# Patient Record
Sex: Female | Born: 1935
Health system: Southern US, Community
[De-identification: ages and names within clinical notes are randomized; demographics above are authoritative.]

## PROBLEM LIST (undated history)

## (undated) DIAGNOSIS — Z8669 Personal history of other diseases of the nervous system and sense organs: Secondary | ICD-10-CM

## (undated) DIAGNOSIS — M810 Age-related osteoporosis without current pathological fracture: Secondary | ICD-10-CM

## (undated) DIAGNOSIS — I10 Essential (primary) hypertension: Secondary | ICD-10-CM

## (undated) DIAGNOSIS — E785 Hyperlipidemia, unspecified: Secondary | ICD-10-CM

## (undated) DIAGNOSIS — S065X9A Traumatic subdural hemorrhage with loss of consciousness of unspecified duration, initial encounter: Secondary | ICD-10-CM

## (undated) DIAGNOSIS — H348192 Central retinal vein occlusion, unspecified eye, stable: Secondary | ICD-10-CM

## (undated) DIAGNOSIS — M1990 Unspecified osteoarthritis, unspecified site: Secondary | ICD-10-CM

## (undated) HISTORY — DX: Age-related osteoporosis without current pathological fracture: M81.0

## (undated) HISTORY — DX: Central retinal vein occlusion, unspecified eye, stable: H34.8192

## (undated) HISTORY — DX: Personal history of other diseases of the nervous system and sense organs: Z86.69

## (undated) HISTORY — PX: CATARACT EXTRACTION W/ INTRAOCULAR LENS  IMPLANT, BILATERAL: SHX1307

## (undated) HISTORY — DX: Essential (primary) hypertension: I10

## (undated) HISTORY — DX: Hyperlipidemia, unspecified: E78.5

---

## 1992-04-14 HISTORY — PX: ABDOMINAL HYSTERECTOMY: SHX81

## 1998-10-24 ENCOUNTER — Other Ambulatory Visit: Admission: RE | Admit: 1998-10-24 | Discharge: 1998-10-24 | Payer: Self-pay | Admitting: Family Medicine

## 2004-05-09 ENCOUNTER — Other Ambulatory Visit: Admission: RE | Admit: 2004-05-09 | Discharge: 2004-05-09 | Payer: Self-pay | Admitting: Family Medicine

## 2004-05-09 ENCOUNTER — Ambulatory Visit: Payer: Self-pay | Admitting: Family Medicine

## 2004-05-15 ENCOUNTER — Ambulatory Visit: Payer: Self-pay | Admitting: Family Medicine

## 2005-01-09 ENCOUNTER — Ambulatory Visit: Payer: Self-pay | Admitting: Family Medicine

## 2005-01-28 ENCOUNTER — Ambulatory Visit: Payer: Self-pay | Admitting: Family Medicine

## 2005-08-28 ENCOUNTER — Ambulatory Visit: Payer: Self-pay | Admitting: Family Medicine

## 2005-09-25 ENCOUNTER — Ambulatory Visit: Payer: Self-pay | Admitting: Internal Medicine

## 2007-01-27 ENCOUNTER — Encounter: Payer: Self-pay | Admitting: Family Medicine

## 2007-01-27 DIAGNOSIS — M25559 Pain in unspecified hip: Secondary | ICD-10-CM

## 2007-01-27 DIAGNOSIS — E7800 Pure hypercholesterolemia, unspecified: Secondary | ICD-10-CM | POA: Insufficient documentation

## 2007-01-27 DIAGNOSIS — H349 Unspecified retinal vascular occlusion: Secondary | ICD-10-CM

## 2007-01-27 DIAGNOSIS — M810 Age-related osteoporosis without current pathological fracture: Secondary | ICD-10-CM

## 2007-02-01 ENCOUNTER — Ambulatory Visit: Payer: Self-pay | Admitting: Family Medicine

## 2007-02-08 LAB — CONVERTED CEMR LAB
Albumin: 3.9 g/dL (ref 3.5–5.2)
CO2: 31 meq/L (ref 19–32)
Creatinine, Ser: 0.9 mg/dL (ref 0.4–1.2)
GFR calc non Af Amer: 64 mL/min
Glucose, Bld: 85 mg/dL (ref 70–99)
HDL: 62.9 mg/dL (ref >39.0)
Phosphorus: 3.8 mg/dL (ref 2.3–4.6)
Sodium: 141 meq/L (ref 135–145)
TSH: 1.27 microintl units/mL (ref 0.35–5.50)
Triglycerides: 71 mg/dL (ref 0–149)
Vit D, 1,25-Dihydroxy: 17 — ABNORMAL LOW (ref 30–89)

## 2007-04-02 ENCOUNTER — Ambulatory Visit: Payer: Self-pay | Admitting: Family Medicine

## 2007-04-07 LAB — CONVERTED CEMR LAB: Vit D, 1,25-Dihydroxy: 38 (ref 30–89)

## 2007-07-07 ENCOUNTER — Ambulatory Visit: Payer: Self-pay | Admitting: Family Medicine

## 2007-07-14 LAB — CONVERTED CEMR LAB: Vit D, 1,25-Dihydroxy: 39 (ref 30–89)

## 2008-01-21 ENCOUNTER — Encounter: Payer: Self-pay | Admitting: Family Medicine

## 2008-02-07 ENCOUNTER — Ambulatory Visit: Payer: Self-pay | Admitting: Family Medicine

## 2008-02-07 DIAGNOSIS — E559 Vitamin D deficiency, unspecified: Secondary | ICD-10-CM

## 2008-02-09 LAB — CONVERTED CEMR LAB
ALT: 16 units/L (ref 0–35)
AST: 23 units/L (ref 0–37)
Alkaline Phosphatase: 53 units/L (ref 39–117)
BUN: 10 mg/dL (ref 6–23)
Bilirubin, Direct: 0.1 mg/dL (ref 0.0–0.3)
Cholesterol: 169 mg/dL (ref 0–200)
Creatinine, Ser: 0.9 mg/dL (ref 0.4–1.2)
GFR calc Af Amer: 78 mL/min
Glucose, Bld: 89 mg/dL (ref 70–99)
Phosphorus: 3.7 mg/dL (ref 2.3–4.6)
Total Protein: 6.7 g/dL (ref 6.0–8.3)

## 2008-02-17 ENCOUNTER — Encounter: Payer: Self-pay | Admitting: Family Medicine

## 2008-04-24 ENCOUNTER — Ambulatory Visit: Payer: Self-pay | Admitting: Family Medicine

## 2008-07-05 ENCOUNTER — Ambulatory Visit: Payer: Self-pay | Admitting: Family Medicine

## 2008-07-06 LAB — CONVERTED CEMR LAB
Basophils Absolute: 0 10*3/uL (ref 0.0–0.1)
Eosinophils Relative: 3.3 % (ref 0.0–5.0)
HCT: 40.3 % (ref 36.0–46.0)
Hemoglobin: 14.2 g/dL (ref 12.0–15.0)
Lymphs Abs: 2.2 10*3/uL (ref 0.7–4.0)
MCV: 95.4 fL (ref 78.0–100.0)
Monocytes Absolute: 0.5 10*3/uL (ref 0.1–1.0)
Neutro Abs: 3.9 10*3/uL (ref 1.4–7.7)
Platelets: 262 10*3/uL (ref 150.0–400.0)
RDW: 12.2 % (ref 11.5–14.6)

## 2008-08-31 ENCOUNTER — Ambulatory Visit: Payer: Self-pay | Admitting: Family Medicine

## 2008-08-31 DIAGNOSIS — M549 Dorsalgia, unspecified: Secondary | ICD-10-CM | POA: Insufficient documentation

## 2009-01-22 ENCOUNTER — Encounter: Payer: Self-pay | Admitting: Family Medicine

## 2009-01-26 LAB — HM MAMMOGRAPHY: HM Mammogram: NORMAL

## 2009-01-29 ENCOUNTER — Encounter (INDEPENDENT_AMBULATORY_CARE_PROVIDER_SITE_OTHER): Payer: Self-pay | Admitting: *Deleted

## 2009-02-07 ENCOUNTER — Telehealth: Payer: Self-pay | Admitting: Family Medicine

## 2009-03-27 ENCOUNTER — Ambulatory Visit: Payer: Self-pay | Admitting: Family Medicine

## 2009-03-27 DIAGNOSIS — I10 Essential (primary) hypertension: Secondary | ICD-10-CM | POA: Insufficient documentation

## 2009-03-30 LAB — CONVERTED CEMR LAB
ALT: 19 units/L (ref 0–35)
AST: 21 units/L (ref 0–37)
Albumin: 3.8 g/dL (ref 3.5–5.2)
Chloride: 105 meq/L (ref 96–112)
Cholesterol: 178 mg/dL (ref 0–200)
Glucose, Bld: 82 mg/dL (ref 70–99)
LDL Cholesterol: 101 mg/dL — ABNORMAL HIGH (ref 0–99)
Phosphorus: 4.3 mg/dL (ref 2.3–4.6)
Potassium: 4.2 meq/L (ref 3.5–5.1)
Sodium: 142 meq/L (ref 135–145)
TSH: 2.34 microintl units/mL (ref 0.35–5.50)
VLDL: 10.4 mg/dL (ref 0.0–40.0)

## 2009-08-10 ENCOUNTER — Encounter (INDEPENDENT_AMBULATORY_CARE_PROVIDER_SITE_OTHER): Payer: Self-pay | Admitting: *Deleted

## 2009-11-14 ENCOUNTER — Telehealth: Payer: Self-pay | Admitting: Family Medicine

## 2009-11-15 ENCOUNTER — Encounter: Payer: Self-pay | Admitting: Family Medicine

## 2010-05-08 ENCOUNTER — Telehealth (INDEPENDENT_AMBULATORY_CARE_PROVIDER_SITE_OTHER): Payer: Self-pay | Admitting: *Deleted

## 2010-05-13 ENCOUNTER — Other Ambulatory Visit: Payer: Self-pay | Admitting: Family Medicine

## 2010-05-13 ENCOUNTER — Encounter: Payer: Self-pay | Admitting: Family Medicine

## 2010-05-13 ENCOUNTER — Ambulatory Visit
Admission: RE | Admit: 2010-05-13 | Discharge: 2010-05-13 | Payer: Self-pay | Source: Home / Self Care | Attending: Family Medicine | Admitting: Family Medicine

## 2010-05-13 LAB — LIPID PANEL
Cholesterol: 170 mg/dL (ref 0–200)
LDL Cholesterol: 88 mg/dL (ref 0–99)
Triglycerides: 89 mg/dL (ref 0.0–149.0)

## 2010-05-13 LAB — CBC WITH DIFFERENTIAL/PLATELET
Basophils Absolute: 0.1 10*3/uL (ref 0.0–0.1)
Eosinophils Absolute: 0.2 10*3/uL (ref 0.0–0.7)
HCT: 43.2 % (ref 36.0–46.0)
Hemoglobin: 15 g/dL (ref 12.0–15.0)
Lymphs Abs: 2.7 10*3/uL (ref 0.7–4.0)
MCHC: 34.8 g/dL (ref 30.0–36.0)
MCV: 95.1 fl (ref 78.0–100.0)
Monocytes Absolute: 0.6 10*3/uL (ref 0.1–1.0)
Neutro Abs: 4.8 10*3/uL (ref 1.4–7.7)
RDW: 13.3 % (ref 11.5–14.6)

## 2010-05-13 LAB — RENAL FUNCTION PANEL
Albumin: 4 g/dL (ref 3.5–5.2)
BUN: 13 mg/dL (ref 6–23)
CO2: 30 mEq/L (ref 19–32)
Chloride: 102 mEq/L (ref 96–112)
Phosphorus: 4.2 mg/dL (ref 2.3–4.6)

## 2010-05-13 LAB — HEPATIC FUNCTION PANEL
Albumin: 4 g/dL (ref 3.5–5.2)
Total Protein: 6.8 g/dL (ref 6.0–8.3)

## 2010-05-14 NOTE — Letter (Signed)
Summary: Colonoscopy Letter  [facility]  [address] [place], [place] [address]   Phone: [phone]  Fax: [phone]      August 10, 2009 MRN: [id number]   [facility] [address] [place], [place]  [address]   Dear Ms. Raburn,   According to your medical record, it is time for you to schedule a Colonoscopy. The American Cancer Society recommends this procedure as a method to detect early colon cancer. Patients with a family history of colon cancer, or a personal history of colon polyps or inflammatory bowel disease are at increased risk.  This letter has beeen generated based on the recommendations made at the time of your procedure. If you feel that in your particular situation this may no longer apply, please contact our office.  Please call our office at [phone] to schedule this appointment or to update your records at your earliest convenience.  Thank you for cooperating with [place] to provide you with the very best care possible.   Sincerely,  Judie Petit T. Russella Dar, M.D.  [facility] [phone]

## 2010-05-14 NOTE — Progress Notes (Signed)
Summary: prior Berkley Harvey is needed for lipitor  Phone Note From Pharmacy   [facility] Summary of Call: Prior auth is needed for lipitor, form is on your shelf. Initial call taken by: Lowella Petties CMA,  November 14, 2009 3:39 PM  Follow-up for Phone Call        please let pt know that ins wants her to change to generic statin - (unless she wants to pay for her lipitor herself)  they ask if she has tried or had side eff to any other chol meds (please ask her )  if not -- may need to change it  I will hold the form Follow-up by: Judith Part MD,  November 15, 2009 8:10 AM  Additional Follow-up for Phone Call Additional follow up Details #1::        Patient notified as instructed by telephone.  Pt said she has not had side effects from other statin  meds. Pt wants to continue taking the Lipitor even if she has to pay for it.Lewanda Rife LPN  November 15, 2009 8:44 AM     Additional Follow-up for Phone Call Additional follow up Details #2::    ok - I will fill out form best I can  I do think lipitor is supposed to go generic soon put in IN box Follow-up by: Judith Part MD,  November 15, 2009 8:45 AM  Additional Follow-up for Phone Call Additional follow up Details #3:: Details for Additional Follow-up Action Taken: comopleted form faxed to [phone]. Form was given to Jacki Cones if needed later.Lewanda Rife LPN  November 15, 2009 11:16 AM    Appended Document: prior Berkley Harvey is needed for lipitor Prior auth received for lipitor, form is on your shelf for signature.

## 2010-05-14 NOTE — Medication Information (Signed)
Summary: Prior Authorization & Approval for Lipitor/[place] [employer]  Prior Authorization & Approval for Lipitor/[place] [employer]   Imported By: Lanelle Bal 11/22/2009 13:54:59  _____________________________________________________________________  External Attachment:    Type:   Image     Comment:   External Document

## 2010-05-16 NOTE — Progress Notes (Signed)
----   Converted from flag ---- ---- 05/05/2010 11:53 AM, Colon Flattery Tower MD wrote: please check lipid and renal and cbc with diff/ tsh/ hepatic / vit D for 272, 733.0 adn 796.2 and vit D def thanks  ---- 05/03/2010 12:56 PM, Liane Comber CMA (AAMA) wrote: Lab orders please! Good Morning! This pt is scheduled for cpx labs Monday, which labs to draw and dx codes to use? Thanks Tasha ------------------------------

## 2010-05-20 ENCOUNTER — Encounter: Payer: Self-pay | Admitting: Family Medicine

## 2010-05-20 ENCOUNTER — Encounter (INDEPENDENT_AMBULATORY_CARE_PROVIDER_SITE_OTHER): Payer: MEDICARE | Admitting: Family Medicine

## 2010-05-20 DIAGNOSIS — E7800 Pure hypercholesterolemia, unspecified: Secondary | ICD-10-CM

## 2010-05-20 DIAGNOSIS — I10 Essential (primary) hypertension: Secondary | ICD-10-CM

## 2010-05-20 DIAGNOSIS — M549 Dorsalgia, unspecified: Secondary | ICD-10-CM

## 2010-06-05 NOTE — Assessment & Plan Note (Signed)
Summary: CPX/ CLE   Vital Signs:  Patient profile:   77 year old female Height:      65 inches Weight:      109.75 pounds BMI:     18.33 Temp:     97.5 degrees F oral Pulse rate:   84 / minute Pulse rhythm:   regular BP sitting:   142 / 84  (left arm) Cuff size:   regular  Vitals Entered By: Lewanda Rife LPN (May 20, 2010 2:24 PM) CC: check up of chronic med problems    History of Present Illness: here for check up of chronic medical problems and to review health mt list has been feeling fine - no prolems   wt is down 5 lb with bmi of 18 just not much of an appetite as she gets older  not a big eater  keeps busy and active   142/84 first bp today  lipids good with lipitor and diet  Last Lipid ProfileCholesterol: 170 (05/13/2010 9:12:38 AM)HDL:  63.80 (05/13/2010 9:12:38 AM)LDL:  88 (05/13/2010 9:12:38 AM)Triglycerides:  Last Liver profileSGOT:  20 (05/13/2010 9:12:38 AM)SPGT:  17 (05/13/2010 9:12:38 AM)T. Bili:  0.5 (05/13/2010 9:12:38 AM)Alk Phos:  64 (05/13/2010 9:12:38 AM)   OP- dexa was 09- due for that  on evista - over 5 years - time to stop it is taking ca and D -- D level in the 40s   hyst in past nl pap n 06 no gyn symptoms or problems    mam 10/10 no lumps on self exam   colonosc nl 04  Td 04 ptx 04 flu shot -- will get one at the pharmacy zoster -- had vaccine       Allergies (verified): No Known Drug Allergies  Past History:  Past Surgical History: Last updated: 02/21/2008 Hysterectomy- fibroid tumor (1994) Abn pap- laser treatment Dexa- osteopenia (10/1998) Dexa- osteoporosis (05/2001) Retinal vein occlusion (01/2002) Carotid US (03/2002) Colonoscopy- normal (08/2002) Dexa- osteoporosis (05/2004) dexa - osteoporosis stable (11/09) Cataract extraction  Family History: Last updated: January 30, 2007 Father: deceased- CAD, ? DM Mother: died age 68 Siblings: sister with MI age 18, 3 brothers with CAD/ bypass  Social History: Last  updated: 02/07/2008 Marital Status: Married Children:  Occupation: retired non smoker no alcohol   Risk Factors: Smoking Status: never (01/30/2007)  Past Medical History: Osteoporosis-- stopped evista after 5 years  hyperlipidemia hx of retinal vein occlusion  Review of Systems General:  Denies fatigue, loss of appetite, and malaise. Eyes:  Denies blurring and eye irritation. CV:  Denies chest pain or discomfort, lightheadness, palpitations, shortness of breath with exertion, and swelling of feet. Resp:  Denies cough, pleuritic, shortness of breath, and wheezing. GI:  Denies abdominal pain, change in bowel habits, and nausea. GU:  Denies dysuria and urinary frequency. MS:  Complains of low back pain; denies joint redness, joint swelling, muscle aches, and cramps. Derm:  Denies itching, lesion(s), poor wound healing, and rash. Neuro:  Denies numbness and tingling. Psych:  Denies anxiety and depression. Endo:  Denies cold intolerance, excessive thirst, excessive urination, and heat intolerance. Heme:  Denies abnormal bruising and bleeding.  Physical Exam  General:  slim well appearing elderly female non smoker- but smells strongly of smoke Head:  normocephalic, atraumatic, and no abnormalities observed.   Eyes:  vision grossly intact, pupils equal, pupils round, and pupils reactive to light.  no conjunctival pallor, injection or icterus  Ears:  R ear normal and L ear normal.   Nose:  no nasal discharge.   Mouth:  pharynx pink and moist.   Neck:  supple with full rom and no masses or thyromegally, no JVD or carotid bruit  Chest Wall:  No deformities, masses, or tenderness noted. Breasts:  No mass, nodules, thickening, tenderness, bulging, retraction, inflamation, nipple discharge or skin changes noted.   Lungs:  Normal respiratory effort, chest expands symmetrically. Lungs are clear to auscultation, no crackles or wheezes. Heart:  Normal rate and regular rhythm. S1 and S2  normal without gallop, murmur, click, rub or other extra sounds. Abdomen:  Bowel sounds positive,abdomen soft and non-tender without masses, organomegaly or hernias noted. no renal bruits  Msk:  No deformity or scoliosis noted of thoracic or lumbar spine.  no acute joint changes  Pulses:  R and L carotid,radial,femoral,dorsalis pedis and posterior tibial pulses are full and equal bilaterally Extremities:  No clubbing, cyanosis, edema, or deformity noted with normal full range of motion of all joints.   Neurologic:  sensation intact to light touch, gait normal, and DTRs symmetrical and normal.   Skin:  Intact without suspicious lesions or rashes Cervical Nodes:  No lymphadenopathy noted Axillary Nodes:  No palpable lymphadenopathy Inguinal Nodes:  No significant adenopathy Psych:  normal affect, talkative and pleasant    Impression & Recommendations:  Problem # 1:  OTHER SCREENING MAMMOGRAM (ICD-V76.12) Assessment Comment Only annual mammogram scheduled adv pt to continue regular self breast exams non remarkable breast exam today  Orders: Radiology Referral (Radiology)  Problem # 2:  HYPERTENSION, BENIGN ESSENTIAL (ICD-401.1) Assessment: New  bp has gradually increased with age and pt has family hx given handout from aafp on HTN to read start norvasc and update if side eff or problems  f/u 6 weeks  Her updated medication list for this problem includes:    Norvasc 5 Mg Tabs (Amlodipine besylate) .Marland Kitchen... 1 by mouth once daily  Orders: Prescription Created Electronically [phone])  Problem # 3:  UNSPECIFIED VITAMIN D DEFICIENCY (ICD-268.9) Assessment: Improved this is much better - with supplementation labs rev  Problem # 4:  HYPERCHOLESTEROLEMIA (ICD-272.0) Assessment: Unchanged  this is in great control  lipitor -no change disc diet - low sat fat (do not want pt to loose wt , however) Her updated medication list for this problem includes:    Lipitor 10 Mg Tabs (Atorvastatin  calcium) ..... One by mouth once daily  Labs Reviewed: SGOT: 20 (05/13/2010)   SGPT: 17 (05/13/2010)   HDL:63.80 (05/13/2010), 66.90 (03/27/2009)  LDL:88 (05/13/2010), 101 (96/29/5284)  Chol:170 (05/13/2010), 178 (03/27/2009)  Trig:89.0 (05/13/2010), 52.0 (03/27/2009)  Orders: Prescription Created Electronically [phone])  Problem # 5:  OSTEOPOROSIS (ICD-733.00) Assessment: Unchanged due for dexa stopping evista after 5 y ca and D and exercise disc  will comment with result The following medications were removed from the medication list:    Evista 60 Mg Tabs (Raloxifene hcl) ..... One by mouth once daily Her updated medication list for this problem includes:    Vitamin D 1000 Unit Tabs (Cholecalciferol) .Marland Kitchen... Take one by mouth daily  Orders: Radiology Referral (Radiology)  Complete Medication List: 1)  Amitriptyline Hcl 25 Mg Tabs (Amitriptyline hcl) .Marland Kitchen.. 1-2 by mouth at bedtime 2)  Lipitor 10 Mg Tabs (Atorvastatin calcium) .... One by mouth once daily 3)  Vitamin D 1000 Unit Tabs (Cholecalciferol) .... Take one by mouth daily 4)  Norvasc 5 Mg Tabs (Amlodipine besylate) .Marland Kitchen.. 1 by mouth once daily  Patient Instructions: 1)  go ahead and stop the evista - you  have been on it long enough  2)  go ahead and get a flu shot at a pharmacy  3)  try to increase meal size/ supplement with healthy snacks to get weight back up  4)  high protien items like nuts/ peanut butter and yogurt are good 5)  also supplements like ensure and boost can be helpful as well  6)  start the norvasc 5 mg one time daily for high blood pressure  7)  avoid excess salt  8)  stay active  9)  follow up with me in about 6 weeks to re check blood pressure  Prescriptions: LIPITOR 10 MG  TABS (ATORVASTATIN CALCIUM) one by mouth once daily  #90 x 3   Entered and Authorized by:   Judith Part MD   Signed by:   Judith Part MD on 05/20/2010   Method used:   [facility]        [facility]  [address] ([facility]ail)       [address]       [place], [place]  [address]       Ph: [phone] or [phone]       Fax: [phone]   RxID:   [id number] AMITRIPTYLINE HCL 25 MG  TABS (AMITRIPTYLINE HCL) 1-2 by mouth at bedtime  #180 x 3   Entered and Authorized by:   Judith Part MD   Signed by:   Judith Part MD on 05/20/2010   Method used:   [facility]        [facility] ([facility]ail)       [address]       [place], [place]  [address]       Ph: [phone] or [phone]       Fax: [phone]   RxID:   [id number] NORVASC 5 MG TABS (AMLODIPINE BESYLATE) 1 by mouth once daily  #30 x 11   Entered and Authorized by:   Judith Part MD   Signed by:   Judith Part MD on 05/20/2010   Method used:   [facility]        [facility] [id number]       [address]       [place], [place]  [address]       Ph: [phone] or [phone]       Fax: [phone]   RxID:   [id number]    Orders Added: 1)  Radiology Referral [Radiology] 2)  Radiology Referral [Radiology] 3)  Prescription Created Electronically [phone]    Current Allergies (reviewed today): No known allergies

## 2010-06-06 ENCOUNTER — Encounter: Payer: Self-pay | Admitting: Family Medicine

## 2010-06-10 ENCOUNTER — Encounter: Payer: Self-pay | Admitting: Family Medicine

## 2010-06-10 ENCOUNTER — Encounter (INDEPENDENT_AMBULATORY_CARE_PROVIDER_SITE_OTHER): Payer: Self-pay | Admitting: *Deleted

## 2010-06-11 ENCOUNTER — Encounter: Payer: Self-pay | Admitting: Family Medicine

## 2010-06-11 DIAGNOSIS — Z8669 Personal history of other diseases of the nervous system and sense organs: Secondary | ICD-10-CM

## 2010-06-11 DIAGNOSIS — E785 Hyperlipidemia, unspecified: Secondary | ICD-10-CM

## 2010-06-11 DIAGNOSIS — M810 Age-related osteoporosis without current pathological fracture: Secondary | ICD-10-CM

## 2010-06-20 NOTE — Letter (Signed)
Summary: Results Follow up Letter  [place] [facility]  [address] [place], [place] [address]   Phone: [phone]  Fax: [phone]    06/10/2010 MRN: [id number]    [facility] [place] [address] [facility] [place], [place]  [address]  [place]    Dear Ms. Shin,  The following are the results of your recent test(s):  Test         Result    Pap Smear:        Normal _____  Not Normal _____ Comments: ______________________________________________________ Cholesterol: LDL(Bad cholesterol):         Your goal is less than:         HDL (Good cholesterol):       Your goal is more than: Comments:  ______________________________________________________ Mammogram:        Normal _X____  Not Normal _____ Comments: Please repeat in one year.  ___________________________________________________________________ Hemoccult:        Normal _____  Not normal _______ Comments:    _____________________________________________________________________ Other Tests:    We routinely do not discuss normal results over the telephone.  If you desire a copy of the results, or you have any questions about this information we can discuss them at your next office visit.   Sincerely,     Roxy Manns, MD

## 2010-06-20 NOTE — Miscellaneous (Signed)
Summary: Mammogram results  Clinical Lists Changes  Observations: Added new observation of MAMMO DUE: 06/2011 (06/10/2010 16:17) Added new observation of MAMMOGRAM: normal (06/06/2010 16:18)      Preventive Care Screening  Mammogram:    Date:  06/06/2010    Next Due:  06/2011    Results:  normal

## 2010-07-02 ENCOUNTER — Encounter: Payer: Self-pay | Admitting: Family Medicine

## 2010-07-02 ENCOUNTER — Ambulatory Visit (INDEPENDENT_AMBULATORY_CARE_PROVIDER_SITE_OTHER): Payer: MEDICARE | Admitting: Family Medicine

## 2010-07-02 VITALS — BP 130/70 | HR 89 | Temp 98.1°F | Wt 110.2 lb

## 2010-07-02 DIAGNOSIS — I10 Essential (primary) hypertension: Secondary | ICD-10-CM

## 2010-07-02 MED ORDER — AMLODIPINE BESYLATE 5 MG PO TABS: 5.0000 mg | ORAL_TABLET | Freq: Every day | ORAL | Status: DC

## 2010-07-02 NOTE — Patient Instructions (Signed)
Blood pressure is better today! Continue the low salt diet / start some walking  Continue the amlodipine- here is the mail order px  If any problems- please let me know  Please follow up in about 6 months

## 2010-07-02 NOTE — Assessment & Plan Note (Signed)
Improved with addn of amlodipine 5 mg daily No side eff or problems Also low salt diet Plans to start walking program today  bp better on 2nd check at 130/70- reassuring  Will plan to f/u in about 6 months

## 2010-07-02 NOTE — Progress Notes (Signed)
  Subjective:    Patient ID: Victoria Ball, female    DOB: 06/03/1925, 77 y.o.   MRN: [id number]  Hypertension Pertinent negatives include no chest pain, headaches, neck pain or shortness of breath.  is feeling well as usual  No changes - about the same  No side eff from the norvasc at all  No home or drugstore checks of bp  No headaches or swelling or vision trouble- overall feels fine  Given handout on HTN -- does not usually eat salt or processed foods  No regular exercise --thinks she will try to get out and walk some now  Weight is stable -which is reassuring   Last chol was good too      Review of Systems  Constitutional: Negative for fatigue and unexpected weight change.  HENT: Negative for nosebleeds and neck pain.   Eyes: Negative for visual disturbance.  Respiratory: Negative for cough and shortness of breath.   Cardiovascular: Negative for chest pain and leg swelling.  Gastrointestinal: Negative for abdominal pain.  Genitourinary: Negative for frequency and flank pain.  Musculoskeletal: Negative for myalgias.  Neurological: Negative for dizziness, syncope, light-headedness, numbness and headaches.  Hematological: Does not bruise/bleed easily.  Psychiatric/Behavioral: The patient is not nervous/anxious.    No Known Allergies Past Medical History  Diagnosis Date  . OP (osteoporosis)     stopped Evista after 5 years  . HLD (hyperlipidemia)   . History of central retinal vein occlusion   . Hypertension    Past Surgical History  Procedure Date  . Abdominal hysterectomy 1994    fibroid tumor   History   Social History  . Marital Status: Married    Spouse Name: N/A    Number of Children: N/A  . Years of Education: N/A   Occupational History  . Retired    Social History Main Topics  . Smoking status: Never Smoker   . Smokeless tobacco: Not on file  . Alcohol Use: No  . Drug Use: Not on file  . Sexually Active: Not on file   Other Topics Concern  .  Not on file   Social History Narrative  . No narrative on file   The patient has a family history of  ASSESSMENT:    Objective:   Physical Exam  Constitutional: She appears well-developed. No distress.  HENT:  Head: Normocephalic.  Eyes: Conjunctivae are normal. Pupils are equal, round, and reactive to light.  Neck: Neck supple. No JVD present. Carotid bruit is not present. No thyromegaly present.  Cardiovascular: Normal rate, regular rhythm, normal heart sounds and normal pulses.  PMI is not displaced.   No murmur heard.      No renal bruits  No carotid bruits   Pulmonary/Chest: Effort normal and breath sounds normal. She has no rales.  Abdominal: She exhibits no abdominal bruit. There is no tenderness.  Musculoskeletal: She exhibits no edema.  Lymphadenopathy:    She has no cervical adenopathy.  Neurological: She displays normal reflexes. No cranial nerve deficit.  Skin: Skin is warm and dry. No rash noted.  Psychiatric: She has a normal mood and affect.          Assessment & Plan:

## 2010-11-15 ENCOUNTER — Other Ambulatory Visit: Payer: Medicare Other | Admitting: Gastroenterology

## 2010-11-21 ENCOUNTER — Other Ambulatory Visit: Payer: Self-pay | Admitting: Family Medicine

## 2010-11-21 NOTE — Telephone Encounter (Signed)
Will refill electronically  

## 2010-11-21 NOTE — Telephone Encounter (Signed)
[facility] request refill for Lipitor 10mg  #90 x 0 pt needs to call for appt.

## 2010-11-21 NOTE — Telephone Encounter (Signed)
[facility] request refill for Amitriptyline 25 mg.Please advise.

## 2011-03-03 ENCOUNTER — Other Ambulatory Visit: Payer: Self-pay | Admitting: Family Medicine

## 2011-05-22 ENCOUNTER — Other Ambulatory Visit: Payer: Self-pay | Admitting: Family Medicine

## 2011-05-22 NOTE — Telephone Encounter (Signed)
[facility] request refill Amlodipine 5 mg #30 x 6.

## 2011-09-01 ENCOUNTER — Other Ambulatory Visit: Payer: Self-pay | Admitting: Family Medicine

## 2011-09-15 ENCOUNTER — Ambulatory Visit (INDEPENDENT_AMBULATORY_CARE_PROVIDER_SITE_OTHER): Payer: Medicare Other | Admitting: Family Medicine

## 2011-09-15 ENCOUNTER — Encounter: Payer: Self-pay | Admitting: Family Medicine

## 2011-09-15 VITALS — BP 98/60 | HR 96 | Temp 98.4°F | Ht 64.75 in | Wt 108.8 lb

## 2011-09-15 DIAGNOSIS — I10 Essential (primary) hypertension: Secondary | ICD-10-CM

## 2011-09-15 DIAGNOSIS — E785 Hyperlipidemia, unspecified: Secondary | ICD-10-CM

## 2011-09-15 DIAGNOSIS — M810 Age-related osteoporosis without current pathological fracture: Secondary | ICD-10-CM

## 2011-09-15 MED ORDER — AMLODIPINE BESYLATE 5 MG PO TABS: 5.0000 mg | ORAL_TABLET | Freq: Every day | ORAL | Status: DC

## 2011-09-15 MED ORDER — AMITRIPTYLINE HCL 25 MG PO TABS: ORAL_TABLET | ORAL | Status: DC

## 2011-09-15 MED ORDER — ATORVASTATIN CALCIUM 10 MG PO TABS: 10.0000 mg | ORAL_TABLET | Freq: Every day | ORAL | Status: DC

## 2011-09-15 NOTE — Assessment & Plan Note (Signed)
Much imp with amlodipine  Is low-normal and pt tolerates fine Will continue this dose Lab today Re schedule annual exam  Refilled med

## 2011-09-15 NOTE — Assessment & Plan Note (Signed)
lipitor and diet Lab today Re scheduled annual exam

## 2011-09-15 NOTE — Assessment & Plan Note (Signed)
Checking D level with labs Will disc at annual exam

## 2011-09-15 NOTE — Progress Notes (Signed)
Subjective:    Patient ID: Victoria Ball, female    DOB: 1925/04/19, 78 y.o.   MRN: [id number]  HPI No problems with bp  Wants to re schedule her PE --is tired today after a long trip  BP Readings from Last 3 Encounters:  09/15/11 98/60  07/02/10 130/70  05/20/10 142/84    bp is on the low side      Today Is feeling good - no dizziness at all  Not a salt eater  Stays quite slim  No cp or palpitations or headaches or edema  No side effects to medicines    Is due for cholesterol check On lipitor and low fat diet Lab Results  Component Value Date   CHOL 168 09/15/2011   HDL 68.70 09/15/2011   LDLCALC 83 09/15/2011   LDLDIRECT 135.8 02/01/2007   TRIG 82.0 09/15/2011   CHOLHDL 2 09/15/2011  has hx of retinal vein occlusion and needs to keep this at goal along with bp  No side effects from lipitor at all Denies muscle or joint pain    Is eating regularly for the most part  Knows she needs to keep her weight up   Patient Active Problem List  Diagnoses  . UNSPECIFIED VITAMIN D DEFICIENCY  . HYPERCHOLESTEROLEMIA  . RETINAL VEIN OCCLUSION  . HIP PAIN, RIGHT, CHRONIC  . BACK PAIN  . OSTEOPOROSIS  . HYPERTENSION, BENIGN ESSENTIAL  . OP (osteoporosis)  . HLD (hyperlipidemia)  . History of central retinal vein occlusion   Past Medical History  Diagnosis Date  . OP (osteoporosis)     stopped Evista after 5 years  . HLD (hyperlipidemia)   . History of central retinal vein occlusion   . Hypertension    Past Surgical History  Procedure Date  . Abdominal hysterectomy 1994    fibroid tumor   History  Substance Use Topics  . Smoking status: Passive Smoker  . Smokeless tobacco: Never Used  . Alcohol Use: No   Family History  Problem Relation Age of Onset  . Coronary artery disease Father   . Diabetes Father     ?   Marland Kitchen Heart attack Sister 71  . Coronary artery disease Brother     CABG  . Coronary artery disease Brother     CABG  . Coronary artery disease Brother    CABG  . Angelman syndrome Paternal Grandfather    No Known Allergies Current Outpatient Prescriptions on File Prior to Visit  Medication Sig Dispense Refill  . amitriptyline (ELAVIL) 25 MG tablet Take 1-2 tablets by mouth at bedtime  180 tablet  1  . amLODipine (NORVASC) 5 MG tablet Take 1 tablet (5 mg total) by mouth daily.  90 tablet  0  . atorvastatin (LIPITOR) 10 MG tablet Take 1 tablet (10 mg total) by mouth daily.  90 tablet  0  . cholecalciferol (VITAMIN D) 1000 UNITS tablet Take 1,000 Units by mouth daily.             Review of Systems Review of Systems  Constitutional: Negative for fever, appetite change,  and unexpected weight change. (is fatigued today after a long trip) Eyes: Negative for pain and visual disturbance.  Respiratory: Negative for cough and shortness of breath.   Cardiovascular: Negative for cp or palpitations    Gastrointestinal: Negative for nausea, diarrhea and constipation.  Genitourinary: Negative for urgency and frequency.  Skin: Negative for pallor or rash   Neurological: Negative for weakness, light-headedness, numbness and headaches.  Hematological: Negative for adenopathy. Does not bruise/bleed easily.  Psychiatric/Behavioral: Negative for dysphoric mood. The patient is not nervous/anxious.         Objective:   Physical Exam  Constitutional: She appears well-developed and well-nourished. No distress.  HENT:  Head: Normocephalic and atraumatic.  Mouth/Throat: Oropharynx is clear and moist.  Eyes: Conjunctivae and EOM are normal. Pupils are equal, round, and reactive to light. No scleral icterus.  Neck: Normal range of motion. Neck supple. No JVD present. Carotid bruit is not present. No thyromegaly present.  Cardiovascular: Normal rate, regular rhythm, normal heart sounds and intact distal pulses.  Exam reveals no gallop.   Pulmonary/Chest: Effort normal and breath sounds normal. No respiratory distress. She has no wheezes.  Abdominal: Soft.  Bowel sounds are normal. She exhibits no distension, no abdominal bruit and no mass. There is no tenderness.  Musculoskeletal: She exhibits no edema.  Lymphadenopathy:    She has no cervical adenopathy.  Neurological: She is alert. She has normal reflexes. No cranial nerve deficit. She exhibits normal muscle tone. Coordination normal.  Skin: Skin is warm and dry. No rash noted. No erythema. No pallor.  Psychiatric: She has a normal mood and affect.          Assessment & Plan:

## 2011-09-15 NOTE — Patient Instructions (Signed)
Labs today bp is ok - if you feel light headed in the future let me know  Do not skip meals  Schedule annual exam in 3 months (any 30 min slot)  I sent px to pharmacy

## 2011-09-16 LAB — CBC WITH DIFFERENTIAL/PLATELET
Basophils Relative: 0.3 % (ref 0.0–3.0)
Eosinophils Relative: 1.2 % (ref 0.0–5.0)
HCT: 40.4 % (ref 36.0–46.0)
Lymphs Abs: 2.6 10*3/uL (ref 0.7–4.0)
Monocytes Relative: 5 % (ref 3.0–12.0)
Platelets: 267 10*3/uL (ref 150.0–400.0)
RBC: 4.27 Mil/uL (ref 3.87–5.11)
WBC: 9.2 10*3/uL (ref 4.5–10.5)

## 2011-09-16 LAB — COMPREHENSIVE METABOLIC PANEL
Albumin: 3.6 g/dL (ref 3.5–5.2)
CO2: 28 mEq/L (ref 19–32)
GFR: 45.54 mL/min — ABNORMAL LOW (ref >60.00)
Glucose, Bld: 82 mg/dL (ref 70–99)
Potassium: 4.5 mEq/L (ref 3.5–5.1)
Sodium: 144 mEq/L (ref 135–145)
Total Bilirubin: 0.6 mg/dL (ref 0.3–1.2)
Total Protein: 6.6 g/dL (ref 6.0–8.3)

## 2011-09-16 LAB — TSH: TSH: 0.79 u[IU]/mL (ref 0.35–5.50)

## 2011-10-20 ENCOUNTER — Ambulatory Visit: Payer: Medicare Other | Admitting: Family Medicine

## 2011-11-04 ENCOUNTER — Other Ambulatory Visit: Payer: Self-pay | Admitting: Family Medicine

## 2011-11-19 ENCOUNTER — Encounter: Payer: Self-pay | Admitting: Family Medicine

## 2011-12-17 ENCOUNTER — Encounter: Payer: Self-pay | Admitting: Family Medicine

## 2011-12-17 ENCOUNTER — Ambulatory Visit (INDEPENDENT_AMBULATORY_CARE_PROVIDER_SITE_OTHER): Payer: Medicare Other | Admitting: Family Medicine

## 2011-12-17 VITALS — BP 128/64 | HR 78 | Temp 98.2°F | Ht 65.0 in | Wt 108.2 lb

## 2011-12-17 DIAGNOSIS — E7800 Pure hypercholesterolemia, unspecified: Secondary | ICD-10-CM

## 2011-12-17 DIAGNOSIS — M810 Age-related osteoporosis without current pathological fracture: Secondary | ICD-10-CM

## 2011-12-17 DIAGNOSIS — I10 Essential (primary) hypertension: Secondary | ICD-10-CM

## 2011-12-17 DIAGNOSIS — E785 Hyperlipidemia, unspecified: Secondary | ICD-10-CM

## 2011-12-17 DIAGNOSIS — E559 Vitamin D deficiency, unspecified: Secondary | ICD-10-CM

## 2011-12-17 MED ORDER — ATORVASTATIN CALCIUM 10 MG PO TABS: 10.0000 mg | ORAL_TABLET | Freq: Every day | ORAL | Status: DC

## 2011-12-17 MED ORDER — AMLODIPINE BESYLATE 5 MG PO TABS: 5.0000 mg | ORAL_TABLET | Freq: Every day | ORAL | Status: DC

## 2011-12-17 MED ORDER — AMITRIPTYLINE HCL 25 MG PO TABS: ORAL_TABLET | ORAL | Status: DC

## 2011-12-17 NOTE — Patient Instructions (Signed)
Keep taking good care of yourself! No change in medicines

## 2011-12-17 NOTE — Progress Notes (Signed)
Subjective:    Patient ID: Victoria Ball, female    DOB: 1926/03/14, 78 y.o.   MRN: [id number]  HPI Here for check up of chronic medical conditions and to review health mt list   Has been feeling great - no complaints at all  Has been working and traveling   Wt is stable with bmi of 18  bp is stable today  No cp or palpitations or headaches or edema  No side effects to medicines  BP Readings from Last 3 Encounters:  12/17/11 128/64  09/15/11 98/60  07/02/10 130/70      Lipid Lab Results  Component Value Date   CHOL 168 09/15/2011   CHOL 170 05/13/2010   CHOL 178 03/27/2009   Lab Results  Component Value Date   HDL 68.70 09/15/2011   HDL 63.80 05/13/2010   HDL 11.91 03/27/2009   Lab Results  Component Value Date   LDLCALC 83 09/15/2011   LDLCALC 88 05/13/2010   LDLCALC 101* 03/27/2009   Lab Results  Component Value Date   TRIG 82.0 09/15/2011   TRIG 89.0 05/13/2010   TRIG 52.0 03/27/2009   Lab Results  Component Value Date   CHOLHDL 2 09/15/2011   CHOLHDL 3 05/13/2010   CHOLHDL 3 03/27/2009   Lab Results  Component Value Date   LDLDIRECT 135.8 02/01/2007   on statin and diet-eats a fairly healthy diet  Eats fruits and veg from the farmer's market  Hx of retinal vein occl  OP dexa 2/12 Vit D level 46- that is good , does not always take calcium (constipates her a bit)  Passive smoke exp fx- no fractures at all  Stays very active  Had 5 years of evista in the past   mammo 8/13 Self exam- no lumps or changes   [facility] --[facility]   hyst in past for fibroids No problems or symptoms   colonosc 5/04= that was normal No cancer in family 10 year follow up     Review of Systems    Review of Systems  Constitutional: Negative for fever, appetite change, fatigue and unexpected weight change.  Eyes: Negative for pain and visual disturbance.  Respiratory: Negative for cough and shortness of breath.   Cardiovascular: Negative for cp or  palpitations    Gastrointestinal: Negative for nausea, diarrhea and constipation.  Genitourinary: Negative for urgency and frequency.  Skin: Negative for pallor or rash   Neurological: Negative for weakness, light-headedness, numbness and headaches.  Hematological: Negative for adenopathy. Does not bruise/bleed easily.  Psychiatric/Behavioral: Negative for dysphoric mood. The patient is not nervous/anxious.      Objective:   Physical Exam  Constitutional: She appears well-developed and well-nourished. No distress.       Slim, robust appearing elderly female  HENT:  Head: Normocephalic and atraumatic.  Right Ear: External ear normal.  Left Ear: External ear normal.  Nose: Nose normal.  Mouth/Throat: Oropharynx is clear and moist.  Eyes: Conjunctivae and EOM are normal. Pupils are equal, round, and reactive to light. No scleral icterus.  Neck: Normal range of motion. Neck supple. No JVD present. Carotid bruit is not present. No thyromegaly present.  Cardiovascular: Normal rate, regular rhythm, normal heart sounds and intact distal pulses.  Exam reveals no gallop.   Pulmonary/Chest: Breath sounds normal. No respiratory distress. She has no wheezes.  Abdominal: Soft. Bowel sounds are normal. She exhibits no distension, no abdominal bruit and no mass. There is no tenderness.  Genitourinary: No breast swelling, tenderness, discharge or bleeding.       Breast exam: No mass, nodules, thickening, tenderness, bulging, retraction, inflamation, nipple discharge or skin changes noted.  No axillary or clavicular LA.  Chaperoned exam.    Musculoskeletal: She exhibits no edema and no tenderness.  Lymphadenopathy:    She has no cervical adenopathy.  Neurological: She is alert. She has normal reflexes. No cranial nerve deficit. She exhibits normal muscle tone. Coordination normal.  Skin: Skin is warm and dry. No rash noted. No erythema. No pallor.       Solar lentigos diffusely   Psychiatric: She has a  normal mood and affect.          Assessment & Plan:

## 2012-01-20 ENCOUNTER — Other Ambulatory Visit: Payer: Self-pay | Admitting: Family Medicine

## 2012-03-01 ENCOUNTER — Other Ambulatory Visit: Payer: Self-pay | Admitting: Family Medicine

## 2012-06-04 ENCOUNTER — Telehealth: Payer: Self-pay | Admitting: *Deleted

## 2012-06-04 NOTE — Telephone Encounter (Signed)
Received prior auth form for pt's amitriptyline hcl 25 mg, form placed in your inbox

## 2012-06-04 NOTE — Telephone Encounter (Signed)
Prior auth faxed

## 2012-06-04 NOTE — Telephone Encounter (Signed)
[employer] left v/m approving amitriptyline 06/04/12 thru 06/04/13. Any questions call [phone]. Approval letter will follow. [facility] notified.

## 2012-06-04 NOTE — Telephone Encounter (Signed)
Done and in IN box 

## 2012-06-04 NOTE — Telephone Encounter (Signed)
Good news, thanks.

## 2012-12-12 ENCOUNTER — Telehealth: Payer: Self-pay | Admitting: Family Medicine

## 2012-12-12 DIAGNOSIS — I10 Essential (primary) hypertension: Secondary | ICD-10-CM

## 2012-12-12 DIAGNOSIS — E559 Vitamin D deficiency, unspecified: Secondary | ICD-10-CM

## 2012-12-12 DIAGNOSIS — E7800 Pure hypercholesterolemia, unspecified: Secondary | ICD-10-CM

## 2012-12-12 DIAGNOSIS — M810 Age-related osteoporosis without current pathological fracture: Secondary | ICD-10-CM

## 2012-12-12 DIAGNOSIS — E785 Hyperlipidemia, unspecified: Secondary | ICD-10-CM

## 2012-12-12 NOTE — Telephone Encounter (Signed)
Message copied by Judy Pimple on Sun Dec 12, 2012  2:58 PM ------      Message from: Alvina Chou      Created: Mon Dec 06, 2012  4:44 PM      Regarding: Lab orders for Tuesday, 9.2.14       Patient is scheduled for CPX labs, please order future labs, Thanks , Terri       ------

## 2012-12-14 ENCOUNTER — Other Ambulatory Visit (INDEPENDENT_AMBULATORY_CARE_PROVIDER_SITE_OTHER): Payer: Medicare Other

## 2012-12-14 DIAGNOSIS — M810 Age-related osteoporosis without current pathological fracture: Secondary | ICD-10-CM

## 2012-12-14 DIAGNOSIS — E785 Hyperlipidemia, unspecified: Secondary | ICD-10-CM

## 2012-12-14 DIAGNOSIS — I10 Essential (primary) hypertension: Secondary | ICD-10-CM

## 2012-12-14 DIAGNOSIS — E559 Vitamin D deficiency, unspecified: Secondary | ICD-10-CM

## 2012-12-14 LAB — TSH: TSH: 1.09 u[IU]/mL (ref 0.35–5.50)

## 2012-12-14 LAB — CBC WITH DIFFERENTIAL/PLATELET
Eosinophils Absolute: 0.1 10*3/uL (ref 0.0–0.7)
Eosinophils Relative: 1.2 % (ref 0.0–5.0)
HCT: 41.3 % (ref 36.0–46.0)
Lymphs Abs: 2.6 10*3/uL (ref 0.7–4.0)
MCHC: 34 g/dL (ref 30.0–36.0)
MCV: 93.5 fl (ref 78.0–100.0)
Monocytes Absolute: 0.4 10*3/uL (ref 0.1–1.0)
Neutrophils Relative %: 61.3 % (ref 43.0–77.0)
Platelets: 283 10*3/uL (ref 150.0–400.0)
RDW: 13.5 % (ref 11.5–14.6)

## 2012-12-14 LAB — COMPREHENSIVE METABOLIC PANEL
AST: 15 U/L (ref 0–37)
Alkaline Phosphatase: 61 U/L (ref 39–117)
Glucose, Bld: 91 mg/dL (ref 70–99)
Potassium: 4.3 mEq/L (ref 3.5–5.1)
Sodium: 137 mEq/L (ref 135–145)
Total Bilirubin: 0.7 mg/dL (ref 0.3–1.2)
Total Protein: 6.7 g/dL (ref 6.0–8.3)

## 2012-12-14 LAB — LIPID PANEL
Cholesterol: 205 mg/dL — ABNORMAL HIGH (ref 0–200)
Total CHOL/HDL Ratio: 3
Triglycerides: 124 mg/dL (ref 0.0–149.0)
VLDL: 24.8 mg/dL (ref 0.0–40.0)

## 2012-12-14 LAB — LDL CHOLESTEROL, DIRECT: Direct LDL: 118.8 mg/dL

## 2012-12-18 ENCOUNTER — Other Ambulatory Visit: Payer: Self-pay | Admitting: Family Medicine

## 2012-12-20 ENCOUNTER — Encounter: Payer: Self-pay | Admitting: Family Medicine

## 2012-12-20 ENCOUNTER — Ambulatory Visit (INDEPENDENT_AMBULATORY_CARE_PROVIDER_SITE_OTHER): Payer: Medicare Other | Admitting: Family Medicine

## 2012-12-20 VITALS — BP 124/66 | HR 106 | Temp 98.4°F | Ht 65.0 in | Wt 104.8 lb

## 2012-12-20 DIAGNOSIS — M810 Age-related osteoporosis without current pathological fracture: Secondary | ICD-10-CM

## 2012-12-20 DIAGNOSIS — I10 Essential (primary) hypertension: Secondary | ICD-10-CM

## 2012-12-20 DIAGNOSIS — Z23 Encounter for immunization: Secondary | ICD-10-CM

## 2012-12-20 DIAGNOSIS — E7800 Pure hypercholesterolemia, unspecified: Secondary | ICD-10-CM

## 2012-12-20 DIAGNOSIS — E785 Hyperlipidemia, unspecified: Secondary | ICD-10-CM

## 2012-12-20 DIAGNOSIS — E559 Vitamin D deficiency, unspecified: Secondary | ICD-10-CM

## 2012-12-20 DIAGNOSIS — Z0000 Encounter for general adult medical examination without abnormal findings: Secondary | ICD-10-CM | POA: Insufficient documentation

## 2012-12-20 MED ORDER — AMITRIPTYLINE HCL 25 MG PO TABS: ORAL_TABLET | ORAL | Status: DC

## 2012-12-20 MED ORDER — ATORVASTATIN CALCIUM 10 MG PO TABS: 10.0000 mg | ORAL_TABLET | Freq: Every day | ORAL | Status: DC

## 2012-12-20 MED ORDER — AMLODIPINE BESYLATE 5 MG PO TABS: 5.0000 mg | ORAL_TABLET | Freq: Every day | ORAL | Status: DC

## 2012-12-20 NOTE — Assessment & Plan Note (Signed)
S/p 5 y course of evista  No fx Pt declines further dexa  Disc safety Compliant with ca and D

## 2012-12-20 NOTE — Assessment & Plan Note (Signed)
Disc goals for lipids and reasons to control them Rev labs with pt Needs to inc lipitor to every day/ imp compliance Disc retinal vein occlusion and role this plays Rev low sat fat diet in detail

## 2012-12-20 NOTE — Telephone Encounter (Signed)
Electronic refill request, please advise  

## 2012-12-20 NOTE — Telephone Encounter (Signed)
Please refill for a year  

## 2012-12-20 NOTE — Progress Notes (Signed)
Subjective:    Patient ID: Victoria Ball, female    DOB: 1925-07-09, 79 y.o.   MRN: [id number]  HPI I have personally reviewed the Medicare Annual Wellness questionnaire and have noted 1. The patient's medical and social history 2. Their use of alcohol, tobacco or illicit drugs 3. Their current medications and supplements 4. The patient's functional ability including ADL's, fall risks, home safety risks and hearing or visual             impairment. 5. Diet and physical activities 6. Evidence for depression or mood disorders  The patients weight, height, BMI have been recorded in the chart and visual acuity is per eye clinic.  I have made referrals, counseling and provided education to the patient based review of the above and I have provided the pt with a written personalized care plan for preventive services.  Wt is down 4 lb with bmi of 17 She is not a big eater and she does not cook -- occ sandwich or potato  Knows she needs to get in more calories    See scanned forms.  Routine anticipatory guidance given to patient.  See health maintenance. Flu vaccine today Shingles vaccine 2010 PNA 4/04 vaccine  Tetanus 4/04 - she is due for that  Colon nl colonosc 2004 - not further needed at her age  Breast cancer screening- mammogram was 8/13 - she will schedule her own No lumps on self exam Advance directive  - has a living will  Cognitive function addressed- see scanned forms- and if abnormal then additional documentation follows. - no problems at all  Falls- none at all  She gets out and stays very very active   Mood- is very good/ no depression/ stays busy  dexa 2/12- does not want further bone density tests  No hx of fx  OP Took 5 y of evista D level is good at 54  bp is stable today  No cp or palpitations or headaches or edema  No side effects to medicines  BP Readings from Last 3 Encounters:  12/20/12 124/66  12/17/11 128/64  09/15/11 98/60       Hyperlipidemia On lipitor and diet Lab Results  Component Value Date   CHOL 205* 12/14/2012   CHOL 168 09/15/2011   CHOL 170 05/13/2010   Lab Results  Component Value Date   HDL 59.70 12/14/2012   HDL 68.70 09/15/2011   HDL 63.80 05/13/2010   Lab Results  Component Value Date   LDLCALC 83 09/15/2011   LDLCALC 88 05/13/2010   LDLCALC 101* 03/27/2009   Lab Results  Component Value Date   TRIG 124.0 12/14/2012   TRIG 82.0 09/15/2011   TRIG 89.0 05/13/2010   Lab Results  Component Value Date   CHOLHDL 3 12/14/2012   CHOLHDL 2 09/15/2011   CHOLHDL 3 05/13/2010   Lab Results  Component Value Date   LDLDIRECT 118.8 12/14/2012   LDLDIRECT 135.8 02/01/2007   up a bit from last time - has missed doses - she takes her lipitor 2 times per week     PMH and SH reviewed  Meds, vitals, and allergies reviewed.   ROS: See HPI.  Otherwise negative.       Patient Active Problem List   Diagnosis Date Noted  . HLD (hyperlipidemia)   . History of central retinal vein occlusion   . HYPERTENSION, BENIGN ESSENTIAL 03/27/2009  . BACK PAIN 08/31/2008  . UNSPECIFIED VITAMIN D DEFICIENCY 02/07/2008  . HYPERCHOLESTEROLEMIA 01/27/2007  .  RETINAL VEIN OCCLUSION 01/27/2007  . HIP PAIN, RIGHT, CHRONIC 01/27/2007  . OSTEOPOROSIS 01/27/2007   Past Medical History  Diagnosis Date  . OP (osteoporosis)     stopped Evista after 5 years  . HLD (hyperlipidemia)   . History of central retinal vein occlusion   . Hypertension    Past Surgical History  Procedure Laterality Date  . Abdominal hysterectomy  1994    fibroid tumor   History  Substance Use Topics  . Smoking status: Passive Smoke Exposure - Never Smoker  . Smokeless tobacco: Never Used  . Alcohol Use: No   Family History  Problem Relation Age of Onset  . Coronary artery disease Father   . Diabetes Father     ?   Marland Kitchen Heart attack Sister 56  . Coronary artery disease Brother     CABG  . Coronary artery disease Brother     CABG  . Coronary  artery disease Brother     CABG  . Angelman syndrome Paternal Grandfather    No Known Allergies Current Outpatient Prescriptions on File Prior to Visit  Medication Sig Dispense Refill  . amitriptyline (ELAVIL) 25 MG tablet Take 1-2 tablets by mouth at bedtime  180 tablet  3  . amLODipine (NORVASC) 5 MG tablet Take 1 tablet (5 mg total) by mouth daily.  90 tablet  3  . atorvastatin (LIPITOR) 10 MG tablet Take 1 tablet (10 mg total) by mouth daily.  90 tablet  3  . cholecalciferol (VITAMIN D) 1000 UNITS tablet Take 1,000 Units by mouth daily.         No current facility-administered medications on file prior to visit.     Review of Systems Review of Systems  Constitutional: Negative for fever, appetite change, fatigue and unexpected weight change.  Eyes: Negative for pain and visual disturbance.  Respiratory: Negative for cough and shortness of breath.   Cardiovascular: Negative for cp or palpitations    Gastrointestinal: Negative for nausea, diarrhea and constipation.  Genitourinary: Negative for urgency and frequency.  Skin: Negative for pallor or rash   Neurological: Negative for weakness, light-headedness, numbness and headaches.  Hematological: Negative for adenopathy. Does not bruise/bleed easily.  Psychiatric/Behavioral: Negative for dysphoric mood. The patient is not nervous/anxious.         Objective:   Physical Exam  Constitutional: She appears well-developed and well-nourished. No distress.  Very slim and well appearing   HENT:  Head: Normocephalic and atraumatic.  Right Ear: External ear normal.  Left Ear: External ear normal.  Nose: Nose normal.  Mouth/Throat: Oropharynx is clear and moist. No oropharyngeal exudate.  Eyes: Conjunctivae and EOM are normal. Pupils are equal, round, and reactive to light. Right eye exhibits no discharge. Left eye exhibits no discharge. No scleral icterus.  Neck: Normal range of motion. Neck supple. No JVD present. Carotid bruit is not  present. No thyromegaly present.  Cardiovascular: Normal rate, regular rhythm, normal heart sounds and intact distal pulses.  Exam reveals no gallop.   Pulmonary/Chest: Effort normal and breath sounds normal. No respiratory distress. She has no wheezes. She has no rales.  Abdominal: Soft. Bowel sounds are normal. She exhibits no distension, no abdominal bruit and no mass. There is no tenderness.  Genitourinary: No breast swelling, tenderness, discharge or bleeding.  Breast exam: No mass, nodules, thickening, tenderness, bulging, retraction, inflamation, nipple discharge or skin changes noted.  No axillary or clavicular LA.  Chaperoned exam.    Musculoskeletal: She exhibits no edema and  no tenderness.  Lymphadenopathy:    She has no cervical adenopathy.  Neurological: She is alert. She has normal reflexes. No cranial nerve deficit. She exhibits normal muscle tone. Coordination normal.  Skin: Skin is warm and dry. No rash noted. No erythema. No pallor.  Psychiatric: She has a normal mood and affect.          Assessment & Plan:

## 2012-12-20 NOTE — Assessment & Plan Note (Signed)
D level is tx on current dose Disc imp to bone and overall health 

## 2012-12-20 NOTE — Assessment & Plan Note (Signed)
bp in fair control at this time  No changes needed  Disc lifstyle change with low sodium diet and exercise  Lab reviewed  

## 2012-12-20 NOTE — Patient Instructions (Addendum)
Please increase calories  Aim for 3 meals a day and 2-3 supplements in between (snacks or boost or carnation instant breakfast) You are due for a tetanus shot (Tdap)- get that at the health dept. Since medicare does not pay for it  Get back on Lipitor (generic) every day to help cholesterol  Take good care of yourself

## 2012-12-20 NOTE — Telephone Encounter (Signed)
Dr. Milinda Antis refilled at her appt today

## 2012-12-20 NOTE — Assessment & Plan Note (Signed)
Reviewed health habits including diet and exercise and skin cancer prevention Also reviewed health mt list, fam hx and immunizations  See HPI Disc imp of weight gain - by inc caloric intake- plan discussed  Flu vaccine today

## 2012-12-21 ENCOUNTER — Other Ambulatory Visit: Payer: Self-pay | Admitting: Family Medicine

## 2013-01-07 ENCOUNTER — Other Ambulatory Visit: Payer: Self-pay | Admitting: Family Medicine

## 2013-09-02 ENCOUNTER — Ambulatory Visit (INDEPENDENT_AMBULATORY_CARE_PROVIDER_SITE_OTHER)
Admission: RE | Admit: 2013-09-02 | Discharge: 2013-09-02 | Disposition: A | Discharge status: Home / Self Care | Payer: Medicare HMO | Source: Ambulatory Visit | Attending: Family Medicine | Admitting: Family Medicine

## 2013-09-02 ENCOUNTER — Encounter: Payer: Self-pay | Admitting: Family Medicine

## 2013-09-02 ENCOUNTER — Ambulatory Visit (INDEPENDENT_AMBULATORY_CARE_PROVIDER_SITE_OTHER): Payer: Medicare HMO | Admitting: Family Medicine

## 2013-09-02 VITALS — BP 132/64 | HR 96 | Temp 98.3°F | Ht 65.0 in | Wt 108.8 lb

## 2013-09-02 DIAGNOSIS — M25559 Pain in unspecified hip: Secondary | ICD-10-CM

## 2013-09-02 DIAGNOSIS — M25551 Pain in right hip: Secondary | ICD-10-CM | POA: Insufficient documentation

## 2013-09-02 NOTE — Progress Notes (Signed)
Subjective:    Patient ID: Victoria Ball, female    DOB: 1926/01/11, 80 y.o.   MRN: [id number]  HPI Here for R hip pain  It hurts all the time -for about a month ago - for no reason  No new exercise or walking   Pain is in upper leg/ groin area and also in outer hip  Does not shoot down her leg  Walking hurts it more  ? Last xray -long time ago   advil seems to help it - 2 pills   Patient Active Problem List   Diagnosis Date Noted  . Encounter for Medicare annual wellness exam 12/20/2012  . History of central retinal vein occlusion   . HYPERTENSION, BENIGN ESSENTIAL 03/27/2009  . BACK PAIN 08/31/2008  . UNSPECIFIED VITAMIN D DEFICIENCY 02/07/2008  . HYPERCHOLESTEROLEMIA 01/27/2007  . RETINAL VEIN OCCLUSION 01/27/2007  . HIP PAIN, RIGHT, CHRONIC 01/27/2007  . OSTEOPOROSIS 01/27/2007   Past Medical History  Diagnosis Date  . OP (osteoporosis)     stopped Evista after 5 years  . HLD (hyperlipidemia)   . History of central retinal vein occlusion   . Hypertension    Past Surgical History  Procedure Laterality Date  . Abdominal hysterectomy  1994    fibroid tumor   History  Substance Use Topics  . Smoking status: Passive Smoke Exposure - Never Smoker  . Smokeless tobacco: Never Used  . Alcohol Use: No   Family History  Problem Relation Age of Onset  . Coronary artery disease Father   . Diabetes Father     ?   Marland Kitchen Heart attack Sister 55  . Coronary artery disease Brother     CABG  . Coronary artery disease Brother     CABG  . Coronary artery disease Brother     CABG  . Angelman syndrome Paternal Grandfather    No Known Allergies Current Outpatient Prescriptions on File Prior to Visit  Medication Sig Dispense Refill  . amitriptyline (ELAVIL) 25 MG tablet Take 1-2 tablets by mouth at bedtime  180 tablet  3  . amLODipine (NORVASC) 5 MG tablet Take 1 tablet (5 mg total) by mouth daily.  90 tablet  3  . atorvastatin (LIPITOR) 10 MG tablet Take 1 tablet (10 mg  total) by mouth daily.  90 tablet  3  . cholecalciferol (VITAMIN D) 1000 UNITS tablet Take 1,000 Units by mouth daily.         No current facility-administered medications on file prior to visit.   Review of Systems Review of Systems  Constitutional: Negative for fever, appetite change, fatigue and unexpected weight change.  Eyes: Negative for pain and visual disturbance.  Respiratory: Negative for cough and shortness of breath.   Cardiovascular: Negative for cp or palpitations    Gastrointestinal: Negative for nausea, diarrhea and constipation.  Genitourinary: Negative for urgency and frequency.  Skin: Negative for pallor or rash   MSK pos for hip pain  Neurological: Negative for weakness, light-headedness, numbness and headaches.  Hematological: Negative for adenopathy. Does not bruise/bleed easily.  Psychiatric/Behavioral: Negative for dysphoric mood. The patient is not nervous/anxious.         Objective:   Physical Exam  Constitutional: She appears well-developed and well-nourished. No distress.  HENT:  Head: Normocephalic and atraumatic.  Eyes: Conjunctivae and EOM are normal. Pupils are equal, round, and reactive to light.  Neck: Normal range of motion. Neck supple.  Cardiovascular: Regular rhythm and normal heart sounds.   Pulmonary/Chest:  Effort normal and breath sounds normal. No respiratory distress. She has no wheezes.  Musculoskeletal: She exhibits tenderness. She exhibits no edema.       Right hip: She exhibits decreased range of motion. She exhibits normal strength, no swelling, no crepitus and no deformity.  Pain to fully flex hip and also int and ext rotate  Gait favors other leg  Very slit trochanteric tenderness  Nl rom knee/ ankle     Neurological: She is alert.  Skin: Skin is warm and dry. No rash noted. No erythema.  Psychiatric: She has a normal mood and affect.          Assessment & Plan:

## 2013-09-02 NOTE — Patient Instructions (Signed)
Baby the hip - take it a little easy and use a cane if needed  Heat if it helps  Xray now  Try tylenol 2 pills up to every 4-6 hours as needed Use ibuprofen only if needed   We will update you with a result and plan

## 2013-09-02 NOTE — Progress Notes (Signed)
Pre visit review using our clinic review tool, if applicable. No additional management support is needed unless otherwise documented below in the visit note. 

## 2013-09-04 NOTE — Assessment & Plan Note (Signed)
Suspect OA Xray today  Ibuprofen or tylenol prin -disc side eff and safety  Update  Enc use of a cane to offload some wt

## 2013-09-05 ENCOUNTER — Telehealth: Payer: Self-pay | Admitting: Family Medicine

## 2013-09-05 DIAGNOSIS — M25551 Pain in right hip: Secondary | ICD-10-CM

## 2013-09-05 NOTE — Telephone Encounter (Signed)
Message copied by Judy Pimple on Mon Sep 05, 2013  3:41 PM ------      Message from: Shon Millet      Created: Fri Sep 02, 2013  4:20 PM       Pt notified of xray results and Dr. Royden Purl comments. Pt agrees with referral and would like to see an ortho doc in [place], I advise pt that Marion/Linda will call her next week to schedule appt ------

## 2013-09-26 ENCOUNTER — Telehealth: Payer: Self-pay | Admitting: Family Medicine

## 2013-09-26 DIAGNOSIS — M160 Bilateral primary osteoarthritis of hip: Secondary | ICD-10-CM | POA: Insufficient documentation

## 2013-09-26 DIAGNOSIS — M25551 Pain in right hip: Secondary | ICD-10-CM

## 2013-09-26 NOTE — Telephone Encounter (Signed)
Pt notified referral done and Shirlee LimerickMarion will call to set up appt.

## 2013-09-26 NOTE — Telephone Encounter (Signed)
I will refer  Usually orthopedics covers this sort of thing -but if she prefers rheumatology - we can see if Dr Kellie Simmeringruslow will see her

## 2013-09-26 NOTE — Telephone Encounter (Signed)
Patient has been seen for hip pain. Patient wants to be referred to a rheumatologist.  She wants to be referred Dr.Truslow - [phone].

## 2013-09-26 NOTE — Addendum Note (Signed)
Addended by: Roxy MannsWER, MARNE A on: 09/26/2013 01:20 PM   Modules accepted: Orders

## 2013-10-17 ENCOUNTER — Other Ambulatory Visit: Payer: Self-pay | Admitting: Rheumatology

## 2013-10-17 DIAGNOSIS — R1031 Right lower quadrant pain: Secondary | ICD-10-CM

## 2013-10-17 DIAGNOSIS — M1611 Unilateral primary osteoarthritis, right hip: Secondary | ICD-10-CM

## 2013-10-19 ENCOUNTER — Ambulatory Visit
Admission: RE | Admit: 2013-10-19 | Discharge: 2013-10-19 | Disposition: A | Discharge status: Home / Self Care | Payer: Medicare HMO | Source: Ambulatory Visit | Attending: Rheumatology | Admitting: Rheumatology

## 2013-10-19 DIAGNOSIS — R1031 Right lower quadrant pain: Secondary | ICD-10-CM

## 2013-10-19 DIAGNOSIS — M1611 Unilateral primary osteoarthritis, right hip: Secondary | ICD-10-CM

## 2013-10-19 MED ORDER — IOHEXOL 180 MG/ML  SOLN
1.0000 mL | Freq: Once | INTRAMUSCULAR | Status: AC | PRN
  Administered 2013-10-19: 1 mL via INTRA_ARTICULAR

## 2013-10-19 MED ORDER — METHYLPREDNISOLONE ACETATE 40 MG/ML INJ SUSP (RADIOLOG
120.0000 mg | Freq: Once | INTRAMUSCULAR | Status: AC
  Administered 2013-10-19: 120 mg via INTRA_ARTICULAR

## 2013-10-24 ENCOUNTER — Emergency Department (HOSPITAL_COMMUNITY): Payer: Medicare HMO

## 2013-10-24 ENCOUNTER — Encounter (HOSPITAL_COMMUNITY): Payer: Self-pay | Admitting: Emergency Medicine

## 2013-10-24 ENCOUNTER — Emergency Department (HOSPITAL_COMMUNITY)
Admission: EM | Admit: 2013-10-24 | Discharge: 2013-10-25 | Disposition: A | Discharge status: Home / Self Care | Payer: Medicare HMO | Attending: Emergency Medicine | Admitting: Emergency Medicine

## 2013-10-24 DIAGNOSIS — Z79899 Other long term (current) drug therapy: Secondary | ICD-10-CM | POA: Insufficient documentation

## 2013-10-24 DIAGNOSIS — M25551 Pain in right hip: Secondary | ICD-10-CM

## 2013-10-24 DIAGNOSIS — T59811A Toxic effect of smoke, accidental (unintentional), initial encounter: Secondary | ICD-10-CM | POA: Insufficient documentation

## 2013-10-24 DIAGNOSIS — E785 Hyperlipidemia, unspecified: Secondary | ICD-10-CM | POA: Insufficient documentation

## 2013-10-24 DIAGNOSIS — M25559 Pain in unspecified hip: Secondary | ICD-10-CM | POA: Insufficient documentation

## 2013-10-24 DIAGNOSIS — Y929 Unspecified place or not applicable: Secondary | ICD-10-CM | POA: Insufficient documentation

## 2013-10-24 DIAGNOSIS — I10 Essential (primary) hypertension: Secondary | ICD-10-CM | POA: Insufficient documentation

## 2013-10-24 DIAGNOSIS — Z8669 Personal history of other diseases of the nervous system and sense organs: Secondary | ICD-10-CM | POA: Insufficient documentation

## 2013-10-24 DIAGNOSIS — Y939 Activity, unspecified: Secondary | ICD-10-CM | POA: Insufficient documentation

## 2013-10-24 DIAGNOSIS — M810 Age-related osteoporosis without current pathological fracture: Secondary | ICD-10-CM | POA: Insufficient documentation

## 2013-10-24 MED ORDER — HYDROCODONE-ACETAMINOPHEN 5-325 MG PO TABS: ORAL_TABLET | ORAL | Status: DC

## 2013-10-24 MED ORDER — HYDROCODONE-ACETAMINOPHEN 5-325 MG PO TABS
1.0000 | ORAL_TABLET | Freq: Once | ORAL | Status: AC
  Administered 2013-10-24: 1 via ORAL
  Filled 2013-10-24: qty 1

## 2013-10-24 NOTE — ED Provider Notes (Signed)
80 year old female, right hip pain, has been going on for several months but worse this week, recent injection with steroids, denies fevers, able to ambulate but with some pain. On exam the patient has a very supple hip with no pain with rotation and only minimal pain with flexion, no deformity, x-rays without signs of fracture, patient stable for discharge with stronger pain medication and referral back to orthopedics for possible surgical intervention if this does not improve. Doubt infection, doubt gout.  Medical screening examination/treatment/procedure(s) were conducted as a shared visit with non-physician practitioner(s) and myself.  I personally evaluated the patient during the encounter.        Vida RollerBrian D Cory Kitt, MD 10/25/13 [phone]

## 2013-10-24 NOTE — ED Provider Notes (Signed)
CSN: [id number]     Arrival date & time 10/24/13  2131 History   First MD Initiated Contact with Patient 10/24/13 2212     Chief Complaint  Patient presents with  . Hip Pain     (Consider location/radiation/quality/duration/timing/severity/associated sxs/prior Treatment) HPI Comments: Patients with history of osteoarthritis, osteoporosis presents with complaints of left hip pain. Patient has had this pain for several weeks but has been worse over the past 2 weeks. Patient has seen her primary care physician as well as rheumatologist for this pain. She is taking tramadol without relief. Patient had a steroid injection into the hip several days ago. This decreased her pain for about one day however the pain was worse tonight she came to the emergency department for evaluation. No other treatments for pain. Patient was given fentanyl in route by EMS. She reports mild improvement with this treatment. She denies fevers, weight loss. Pain is worse with movement but at present is present even at rest. No back pain. No weakness in her legs. No numbness or tingling in her legs. The onset of this condition was acute.      Patient is a 80 y.o. female presenting with hip pain. The history is provided by the patient and medical records.  Hip Pain Associated symptoms include arthralgias. Pertinent negatives include no fever, joint swelling, neck pain, numbness or weakness.    Past Medical History  Diagnosis Date  . OP (osteoporosis)     stopped Evista after 5 years  . HLD (hyperlipidemia)   . History of central retinal vein occlusion   . Hypertension    Past Surgical History  Procedure Laterality Date  . Abdominal hysterectomy  1994    fibroid tumor   Family History  Problem Relation Age of Onset  . Coronary artery disease Father   . Diabetes Father     ?   Marland Kitchen Heart attack Sister 36  . Coronary artery disease Brother     CABG  . Coronary artery disease Brother     CABG  . Coronary artery  disease Brother     CABG  . Angelman syndrome Paternal Grandfather    History  Substance Use Topics  . Smoking status: Passive Smoke Exposure - Never Smoker  . Smokeless tobacco: Never Used  . Alcohol Use: No   OB History   Grav Para Term Preterm Abortions TAB SAB Ect Mult Living                 Review of Systems  Constitutional: Negative for fever and activity change.  Gastrointestinal: Negative for constipation.  Musculoskeletal: Positive for arthralgias. Negative for back pain, joint swelling and neck pain.  Skin: Negative for wound.  Neurological: Negative for weakness and numbness.      Allergies  Review of patient's allergies indicates no known allergies.  Home Medications   Prior to Admission medications   Medication Sig Start Date End Date Taking? Authorizing Provider  amitriptyline (ELAVIL) 25 MG tablet Take 1-2 tablets by mouth at bedtime 12/20/12  Yes Marne A Tower, MD  amLODipine (NORVASC) 5 MG tablet Take 1 tablet (5 mg total) by mouth daily. 12/20/12  Yes Judy Pimple, MD  atorvastatin (LIPITOR) 10 MG tablet Take 1 tablet (10 mg total) by mouth daily. 12/20/12  Yes Judy Pimple, MD  cholecalciferol (VITAMIN D) 1000 UNITS tablet Take 1,000 Units by mouth daily.     Yes Historical Provider, MD  meloxicam (MOBIC) 7.5 MG tablet Take 7.5 mg  by mouth daily.  09/15/13  Yes Historical Provider, MD  traMADol (ULTRAM) 50 MG tablet Take 50 mg by mouth every 6 (six) hours as needed (pain).  09/28/13  Yes Historical Provider, MD   BP 150/76  Pulse 113  Temp(Src) 98.1 F (36.7 C) (Oral)  Resp 24  Ht 5\' 5"  (1.651 m)  Wt 105 lb (47.628 kg)  BMI 17.47 kg/m2  SpO2 96%  LMP 04/14/1977 Physical Exam  Nursing note and vitals reviewed. Constitutional: She appears well-developed and well-nourished.  HENT:  Head: Normocephalic and atraumatic.  Eyes: Pupils are equal, round, and reactive to light.  Neck: Normal range of motion. Neck supple.  Cardiovascular: Exam reveals no  decreased pulses.   Pulses:      Dorsalis pedis pulses are 2+ on the right side, and 2+ on the left side.       Posterior tibial pulses are 2+ on the right side, and 2+ on the left side.  Musculoskeletal: She exhibits tenderness. She exhibits no edema.       Right hip: She exhibits tenderness. She exhibits normal range of motion, normal strength and no bony tenderness.       Right knee: Normal.       Right ankle: Normal.       Lumbar back: Normal.       Legs: Neurological: She is alert. No sensory deficit.  Motor, sensation, and vascular distal to the injury is fully intact.   Skin: Skin is warm and dry.  Psychiatric: She has a normal mood and affect.    ED Course  Procedures (including critical care time) Labs Review Labs Reviewed - No data to display  Imaging Review Dg Hip Complete Right  10/24/2013   CLINICAL DATA:  Right hip pain.  No trauma.  EXAM: RIGHT HIP - COMPLETE 2+ VIEW  COMPARISON:  09/02/2013  FINDINGS: There is no evidence of hip fracture or dislocation. No evidence of bone erosion or asymmetric osteopenia. Sclerotic focus in the intertrochanteric left femur is stable from previous, likely bone island. Diffuse osteopenia.  IMPRESSION: Negative.   Electronically Signed   By: Tiburcio Pea M.D.   On: 10/24/2013 23:15     EKG Interpretation None      11:11 PM Patient seen and examined. Work-up initiated. Medications ordered.   Vital signs reviewed and are as follows: Filed Vitals:   10/24/13 2157  BP: 150/76  Pulse: 113  Temp: 98.1 F (36.7 C)  Resp: 24   Patient discussed with and seen by Dr. Hyacinth Meeker.  No concern for septic joint. Patient wants to go home. Will discharge to home with Vicodin. Patient encouraged to followup with her primary care physician in the next 2 days for a recheck. She is to return to the emergency department with uncontrolled pain, fever, inability to walk, or other concerns. Patient verbalizes understanding and agrees with  plan.  Discussed appropriate use of narcotic pain medicine and that she should only take this medication at the lowest dose under direct supervision others. Discussed potential for fall and injury while on this medication.   MDM   Final diagnoses:  Right hip pain   Patient with right hip pain. She has good range of motion and no other systemic symptoms of illness. Do not suspect septic joint infection with this exam. Patient appears well. She is ambulatory. Up to this point, patient has only been on tramadol for pain which is not currently taking because it did not help her. Feel that  careful use of narcotic pain medications is indicated in this patient and should provide her with better pain control. Patient has appropriate PCP and rheumatology followup.    Renne CriglerJoshua Caswell Alvillar, PA-C 10/25/13 [phone]

## 2013-10-24 NOTE — ED Notes (Signed)
Patient has chronic pain, and complains of right hip pain.  She recently received an "injection" 3 days ago for pain.  Patient is from home. No falls, hips has no deformity. Patient is alert and oriented. Pain is "the same prior to receiving injection".  BP 184/108, P 120. Hx of arthritis. 20g in left hand place by EMS with fentanyl total of 150 en route.  No relief.

## 2013-10-24 NOTE — Discharge Instructions (Signed)
Please read and follow all provided instructions.  Your diagnoses today include:  1. Right hip pain     Tests performed today include:  An x-ray of the affected area - does NOT show any broken bones  Vital signs. See below for your results today.   Medications prescribed:   Vicodin (hydrocodone/acetaminophen) - narcotic pain medication  DO NOT drive or perform any activities that require you to be awake and alert because this medicine can make you drowsy. BE VERY CAREFUL not to take multiple medicines containing Tylenol (also called acetaminophen). Doing so can lead to an overdose which can damage your liver and cause liver failure and possibly death.  Use pain medication only under direct supervision at the lowest possible dose needed to control your pain.   Take any prescribed medications only as directed.  Home care instructions:   Follow any educational materials contained in this packet  Follow R.I.C.E. Protocol:  R - rest your injury   I  - use ice on injury without applying directly to skin  C - compress injury with bandage or splint  E - elevate the injury as much as possible  Follow-up instructions: Please follow-up with your primary care provider this week for further evaluation.   Return instructions:   Please return if your toes are numb or tingling, appear gray or blue, or you have severe pain (also elevate leg and loosen splint or wrap if you were given one)  Please return to the Emergency Department if you experience worsening symptoms.   Please return if you have any other emergent concerns.  Additional Information:  Your vital signs today were: BP 148/77   Pulse 110   Temp(Src) 98.1 F (36.7 C) (Oral)   Resp 20   Ht 5\' 5"  (1.651 m)   Wt 105 lb (47.628 kg)   BMI 17.47 kg/m2   SpO2 95%   LMP 04/14/1977 If your blood pressure (BP) was elevated above 135/85 this visit, please have this repeated by your doctor within one month. --------------

## 2013-10-25 NOTE — ED Provider Notes (Signed)
Medical screening examination/treatment/procedure(s) were conducted as a shared visit with non-physician practitioner(s) and myself.  I personally evaluated the patient during the encounter  Please see my separate respective documentation pertaining to this patient encounter   Vida RollerBrian D Rileigh Kawashima, MD 10/25/13 [phone]

## 2013-11-02 ENCOUNTER — Ambulatory Visit (INDEPENDENT_AMBULATORY_CARE_PROVIDER_SITE_OTHER): Payer: Medicare HMO | Admitting: Family Medicine

## 2013-11-02 ENCOUNTER — Encounter: Payer: Self-pay | Admitting: Family Medicine

## 2013-11-02 VITALS — BP 126/58 | HR 104 | Temp 97.8°F | Ht 65.0 in | Wt 102.8 lb

## 2013-11-02 DIAGNOSIS — M25551 Pain in right hip: Secondary | ICD-10-CM

## 2013-11-02 DIAGNOSIS — M25559 Pain in unspecified hip: Secondary | ICD-10-CM

## 2013-11-02 NOTE — Patient Instructions (Signed)
Please call for pt's orthopedic notes [facility]([place]) and her rheumatology notes (Dr Kellie Simmeringruslow) Use your norco only when you need it  Stay active  Stop tramadol and meloxicam

## 2013-11-02 NOTE — Progress Notes (Signed)
Pre visit review using our clinic review tool, if applicable. No additional management support is needed unless otherwise documented below in the visit note. 

## 2013-11-02 NOTE — Progress Notes (Signed)
Subjective:    Patient ID: Victoria Ball, female    DOB: 1926/01/06, 80 y.o.   MRN: [id number]  HPI Here for f/u of ER visit for hip pain   Had a hip injection 7/6 (steroid and also anesthetic)  Ordered by Dr Kellie Simmeringruslow - who "did not tell me what was wrong" Ordered that injection - it did help for one day   Also saw ortho- gave her meloxicam   Then pain came back severely on 7/13 -- had to go to hosp by EMS  Worried about septic joint- did not have that and had not broken it  Was given norco- she only takes if she absolutely has to (tramadol did not help)   Her pain is now improved from what it was    The meloxicam is not helping  Tramadol is not helping  She takes norco when needed - does not use often    Patient Active Problem List   Diagnosis Date Noted  . Osteoarthritis of both hips 09/26/2013  . Right hip pain 09/02/2013  . Encounter for Medicare annual wellness exam 12/20/2012  . History of central retinal vein occlusion   . HYPERTENSION, BENIGN ESSENTIAL 03/27/2009  . BACK PAIN 08/31/2008  . UNSPECIFIED VITAMIN D DEFICIENCY 02/07/2008  . HYPERCHOLESTEROLEMIA 01/27/2007  . RETINAL VEIN OCCLUSION 01/27/2007  . HIP PAIN, RIGHT, CHRONIC 01/27/2007  . OSTEOPOROSIS 01/27/2007   Past Medical History  Diagnosis Date  . OP (osteoporosis)     stopped Evista after 5 years  . HLD (hyperlipidemia)   . History of central retinal vein occlusion   . Hypertension    Past Surgical History  Procedure Laterality Date  . Abdominal hysterectomy  1994    fibroid tumor   History  Substance Use Topics  . Smoking status: Passive Smoke Exposure - Never Smoker  . Smokeless tobacco: Never Used  . Alcohol Use: No   Family History  Problem Relation Age of Onset  . Coronary artery disease Father   . Diabetes Father     ?   Marland Kitchen. Heart attack Sister [id number]  . Coronary artery disease Brother     CABG  . Coronary artery disease Brother     CABG  . Coronary artery disease Brother    CABG  . Angelman syndrome Paternal Grandfather    No Known Allergies Current Outpatient Prescriptions on File Prior to Visit  Medication Sig Dispense Refill  . amitriptyline (ELAVIL) 25 MG tablet Take 1-2 tablets by mouth at bedtime  180 tablet  3  . amLODipine (NORVASC) 5 MG tablet Take 1 tablet (5 mg total) by mouth daily.  90 tablet  3  . atorvastatin (LIPITOR) 10 MG tablet Take 1 tablet (10 mg total) by mouth daily.  90 tablet  3  . cholecalciferol (VITAMIN D) 1000 UNITS tablet Take 1,000 Units by mouth daily.        Marland Kitchen. HYDROcodone-acetaminophen (NORCO/VICODIN) 5-325 MG per tablet Take 1 tablet every 6 hours as needed for severe pain.  12 tablet  0  . meloxicam (MOBIC) 7.5 MG tablet Take 7.5 mg by mouth daily.       . traMADol (ULTRAM) 50 MG tablet Take 50 mg by mouth every 6 (six) hours as needed (pain).        No current facility-administered medications on file prior to visit.      Review of Systems Review of Systems  Constitutional: Negative for fever, appetite change, fatigue and unexpected weight change.  Eyes:  Negative for pain and visual disturbance.  Respiratory: Negative for cough and shortness of breath.   Cardiovascular: Negative for cp or palpitations    Gastrointestinal: Negative for nausea, diarrhea and constipation.  Genitourinary: Negative for urgency and frequency.  Skin: Negative for pallor or rash   MSK pos for ongoing R hip pain - at times severe and now improved  Neurological: Negative for weakness, light-headedness, numbness and headaches.  Hematological: Negative for adenopathy. Does not bruise/bleed easily.  Psychiatric/Behavioral: Negative for dysphoric mood. The patient is not nervous/anxious.         Objective:   Physical Exam  Constitutional: She appears well-developed and well-nourished. No distress.  Slim and well appearing   HENT:  Head: Normocephalic and atraumatic.  Eyes: Conjunctivae and EOM are normal. Pupils are equal, round, and  reactive to light. No scleral icterus.  Neck: Normal range of motion. Neck supple.  Cardiovascular: Normal rate and regular rhythm.   Pulmonary/Chest: Effort normal and breath sounds normal.  Musculoskeletal: She exhibits tenderness. She exhibits no edema.  Pain in R hip/groin area with int and ext rotation of hip  No trochanteric tenderness No LS tenderness   Lymphadenopathy:    She has no cervical adenopathy.  Neurological: She is alert. She has normal reflexes.  Skin: Skin is warm and dry. No rash noted. No erythema. No pallor.  Psychiatric: She has a normal mood and affect.          Assessment & Plan:   Problem List Items Addressed This Visit     Other   HIP PAIN, RIGHT, CHRONIC - Primary     After ortho and also rheum evaluations Her xrays show mild OA  Had injection ordered by Dr Kellie Simmering followed by much worsened symptoms and now stable Using vicodin sparingly  Will send for records from ortho and rheum to review

## 2013-11-03 NOTE — Assessment & Plan Note (Signed)
After ortho and also rheum evaluations Her xrays show mild OA  Had injection ordered by Dr Kellie Simmeringruslow followed by much worsened symptoms and now stable Using vicodin sparingly  Will send for records from ortho and rheum to review

## 2013-11-24 ENCOUNTER — Telehealth: Payer: Self-pay | Admitting: Family Medicine

## 2013-11-24 NOTE — Telephone Encounter (Signed)
I can refill that for her tomorrow when I am back in the office to fill it

## 2013-11-24 NOTE — Telephone Encounter (Signed)
Pt came in wanting to get her rx refilled   hydrocodon-acetaminophen 5-325 Take 1 tablet by mouth every 6 hours as needed for severe pain.    rx was written  By Mancel Balejoshua scott geiple @ [facility]  Date filled 10/25/13 Qty of 12

## 2013-11-24 NOTE — Telephone Encounter (Signed)
See prev note

## 2013-11-25 MED ORDER — HYDROCODONE-ACETAMINOPHEN 5-325 MG PO TABS: ORAL_TABLET | ORAL | Status: DC

## 2013-11-25 NOTE — Telephone Encounter (Signed)
Pt notified Rx ready for pickup 

## 2013-11-25 NOTE — Telephone Encounter (Signed)
Px printed for pick up in IN box  

## 2013-11-28 ENCOUNTER — Ambulatory Visit: Payer: Medicare HMO | Admitting: Family Medicine

## 2014-01-20 ENCOUNTER — Ambulatory Visit (INDEPENDENT_AMBULATORY_CARE_PROVIDER_SITE_OTHER): Payer: Medicare HMO | Admitting: Family Medicine

## 2014-01-20 ENCOUNTER — Encounter: Payer: Self-pay | Admitting: Family Medicine

## 2014-01-20 VITALS — BP 110/64 | HR 98 | Temp 97.7°F | Ht 65.0 in | Wt 105.2 lb

## 2014-01-20 DIAGNOSIS — I10 Essential (primary) hypertension: Secondary | ICD-10-CM

## 2014-01-20 DIAGNOSIS — E7800 Pure hypercholesterolemia, unspecified: Secondary | ICD-10-CM

## 2014-01-20 DIAGNOSIS — E559 Vitamin D deficiency, unspecified: Secondary | ICD-10-CM

## 2014-01-20 DIAGNOSIS — M25551 Pain in right hip: Secondary | ICD-10-CM

## 2014-01-20 DIAGNOSIS — Z23 Encounter for immunization: Secondary | ICD-10-CM

## 2014-01-20 DIAGNOSIS — M810 Age-related osteoporosis without current pathological fracture: Secondary | ICD-10-CM

## 2014-01-20 LAB — CBC WITH DIFFERENTIAL/PLATELET
BASOS ABS: 0 10*3/uL (ref 0.0–0.1)
Basophils Relative: 0.3 % (ref 0.0–3.0)
Eosinophils Absolute: 0 10*3/uL (ref 0.0–0.7)
Eosinophils Relative: 0.4 % (ref 0.0–5.0)
HEMATOCRIT: 38.9 % (ref 36.0–46.0)
Hemoglobin: 12.6 g/dL (ref 12.0–15.0)
LYMPHS ABS: 2.3 10*3/uL (ref 0.7–4.0)
Lymphocytes Relative: 19.6 % (ref 12.0–46.0)
MCHC: 32.4 g/dL (ref 30.0–36.0)
MCV: 89.9 fl (ref 78.0–100.0)
MONO ABS: 0.7 10*3/uL (ref 0.1–1.0)
Monocytes Relative: 5.7 % (ref 3.0–12.0)
NEUTROS PCT: 74 % (ref 43.0–77.0)
Neutro Abs: 8.9 10*3/uL — ABNORMAL HIGH (ref 1.4–7.7)
Platelets: 452 10*3/uL — ABNORMAL HIGH (ref 150.0–400.0)
RBC: 4.33 Mil/uL (ref 3.87–5.11)
RDW: 15.6 % — AB (ref 11.5–15.5)
WBC: 12 10*3/uL — AB (ref 4.0–10.5)

## 2014-01-20 LAB — COMPREHENSIVE METABOLIC PANEL
ALBUMIN: 3 g/dL — AB (ref 3.5–5.2)
ALK PHOS: 95 U/L (ref 39–117)
ALT: 9 U/L (ref 0–35)
AST: 17 U/L (ref 0–37)
BILIRUBIN TOTAL: 0.3 mg/dL (ref 0.2–1.2)
BUN: 16 mg/dL (ref 6–23)
CO2: 25 mEq/L (ref 19–32)
Calcium: 9 mg/dL (ref 8.4–10.5)
Chloride: 104 mEq/L (ref 96–112)
Creatinine, Ser: 1 mg/dL (ref 0.4–1.2)
GFR: 54.01 mL/min — ABNORMAL LOW (ref >60.00)
Glucose, Bld: 93 mg/dL (ref 70–99)
POTASSIUM: 4.5 meq/L (ref 3.5–5.1)
Sodium: 138 mEq/L (ref 135–145)
TOTAL PROTEIN: 7 g/dL (ref 6.0–8.3)

## 2014-01-20 LAB — LIPID PANEL
CHOL/HDL RATIO: 5
Cholesterol: 250 mg/dL — ABNORMAL HIGH (ref 0–200)
HDL: 50.6 mg/dL (ref >39.00)
LDL Cholesterol: 176 mg/dL — ABNORMAL HIGH (ref 0–99)
NONHDL: 199.4
Triglycerides: 119 mg/dL (ref 0.0–149.0)
VLDL: 23.8 mg/dL (ref 0.0–40.0)

## 2014-01-20 LAB — TSH: TSH: 0.89 u[IU]/mL (ref 0.35–4.50)

## 2014-01-20 LAB — VITAMIN D 25 HYDROXY (VIT D DEFICIENCY, FRACTURES): VITD: 26.29 ng/mL — ABNORMAL LOW (ref 30.00–100.00)

## 2014-01-20 MED ORDER — ATORVASTATIN CALCIUM 10 MG PO TABS: 10.0000 mg | ORAL_TABLET | Freq: Every day | ORAL | Status: DC

## 2014-01-20 MED ORDER — HYDROCODONE-ACETAMINOPHEN 5-325 MG PO TABS: ORAL_TABLET | ORAL | Status: DC

## 2014-01-20 MED ORDER — AMITRIPTYLINE HCL 25 MG PO TABS: ORAL_TABLET | ORAL | Status: DC

## 2014-01-20 MED ORDER — AMLODIPINE BESYLATE 5 MG PO TABS: 5.0000 mg | ORAL_TABLET | Freq: Every day | ORAL | Status: DC

## 2014-01-20 NOTE — Patient Instructions (Signed)
Labs today Let's stick with the norco twice daily as needed for pain in hip  If your symptoms worsen further let me know - we could consider a pain clinic referral  Flu shot today

## 2014-01-20 NOTE — Progress Notes (Signed)
   Subjective:    Patient ID: Victoria Ball, female    DOB: Apr 10, 1926, 80 y.o.   MRN: [id number]  HPI Here for f/u of chronic health problems   Hanging in there   Her hip still hurts quite a bit  Injection did not help  PT exercises did not help  Has to take her norco twice a day - this is new  Takes some of her pain away-not unbearable but still hurts  mobic did not work or help  Her pain is worse when she is up moving around   She is not ready for a pain clinic yet    Pain medication does not make her dizzy at all    Would never consider a hip replacement   Last xray was in may  Has mild OA  Saw ortho and rheum Thinks her hip pain is getting worse  bp is stable today  No cp or palpitations or headaches or edema  No side effects to medicines  BP Readings from Last 3 Encounters:  01/20/14 110/64  11/02/13 126/58  10/24/13 148/77      Wt is up 3 lb  bmi 17   Is due for her labs for cholesterol    Had her flu shot today       Review of Systems     Objective:   Physical Exam        Assessment & Plan:

## 2014-01-20 NOTE — Assessment & Plan Note (Signed)
Lipid panel today  On atorvastatin and diet  Hx of retinal vein occlusion in the past  Rev low sat fat diet

## 2014-01-20 NOTE — Assessment & Plan Note (Signed)
Ongoing  Per ortho/rheum-pain has been out of proportion to her xray findings (mild deg change)- but pt states it has worsened No imp with inj or PT in the past or nsaid  She recently needed to increase norco to bid to control pain  Does not use a cane-but I adv her to consider it  She declines hip repl in the future due to age  She declines another xray today -but will consider to watch  Brought up the idea of a pain clinic if symptoms worsen-she will consider  Refilled norco for bid with disc of habit potential/side eff/ fall risk

## 2014-01-20 NOTE — Progress Notes (Signed)
Pre visit review using our clinic review tool, if applicable. No additional management support is needed unless otherwise documented below in the visit note. 

## 2014-01-20 NOTE — Assessment & Plan Note (Signed)
Check level today Has OP -no falls or fx  She declines further dexa

## 2014-01-20 NOTE — Assessment & Plan Note (Signed)
bp in fair control at this time  BP Readings from Last 1 Encounters:  01/20/14 110/64   No changes needed Disc lifstyle change with low sodium diet and exercise  Labs today  Refilled amlodipine

## 2014-01-23 ENCOUNTER — Telehealth: Payer: Self-pay | Admitting: Family Medicine

## 2014-01-23 NOTE — Telephone Encounter (Signed)
emmi mailed  °

## 2014-03-07 ENCOUNTER — Telehealth: Payer: Self-pay | Admitting: Family Medicine

## 2014-03-07 ENCOUNTER — Encounter: Payer: Self-pay | Admitting: Family Medicine

## 2014-03-07 MED ORDER — HYDROCODONE-ACETAMINOPHEN 5-325 MG PO TABS: 1.0000 | ORAL_TABLET | Freq: Two times a day (BID) | ORAL | Status: DC | PRN

## 2014-03-07 NOTE — Telephone Encounter (Signed)
Pt notified Rx ready for pickup 

## 2014-03-07 NOTE — Telephone Encounter (Signed)
Px printed for pick up in IN box  

## 2014-03-07 NOTE — Telephone Encounter (Signed)
Pt walked in to get a refill on hydrocodon acetaminophen 5-325    Pt has 2 days left of meds

## 2014-03-23 ENCOUNTER — Other Ambulatory Visit: Payer: Self-pay | Admitting: Family Medicine

## 2014-03-23 DIAGNOSIS — E785 Hyperlipidemia, unspecified: Secondary | ICD-10-CM

## 2014-03-23 DIAGNOSIS — D72829 Elevated white blood cell count, unspecified: Secondary | ICD-10-CM

## 2014-03-28 ENCOUNTER — Ambulatory Visit (INDEPENDENT_AMBULATORY_CARE_PROVIDER_SITE_OTHER): Payer: Medicare HMO | Admitting: Family Medicine

## 2014-03-28 ENCOUNTER — Encounter: Payer: Self-pay | Admitting: Family Medicine

## 2014-03-28 ENCOUNTER — Ambulatory Visit (INDEPENDENT_AMBULATORY_CARE_PROVIDER_SITE_OTHER)
Admission: RE | Admit: 2014-03-28 | Discharge: 2014-03-28 | Disposition: A | Discharge status: Home / Self Care | Payer: Medicare HMO | Source: Ambulatory Visit | Attending: Family Medicine | Admitting: Family Medicine

## 2014-03-28 VITALS — BP 150/78 | HR 110 | Temp 98.4°F | Ht 65.0 in | Wt 100.5 lb

## 2014-03-28 DIAGNOSIS — E559 Vitamin D deficiency, unspecified: Secondary | ICD-10-CM

## 2014-03-28 DIAGNOSIS — I10 Essential (primary) hypertension: Secondary | ICD-10-CM

## 2014-03-28 DIAGNOSIS — M25551 Pain in right hip: Secondary | ICD-10-CM

## 2014-03-28 DIAGNOSIS — D72829 Elevated white blood cell count, unspecified: Secondary | ICD-10-CM

## 2014-03-28 DIAGNOSIS — R634 Abnormal weight loss: Secondary | ICD-10-CM

## 2014-03-28 DIAGNOSIS — E7800 Pure hypercholesterolemia, unspecified: Secondary | ICD-10-CM

## 2014-03-28 LAB — CBC WITH DIFFERENTIAL/PLATELET
BASOS ABS: 0 10*3/uL (ref 0.0–0.1)
Basophils Relative: 0.3 % (ref 0.0–3.0)
Eosinophils Absolute: 0.1 10*3/uL (ref 0.0–0.7)
Eosinophils Relative: 1 % (ref 0.0–5.0)
HCT: 40.1 % (ref 36.0–46.0)
Hemoglobin: 13 g/dL (ref 12.0–15.0)
LYMPHS PCT: 25.8 % (ref 12.0–46.0)
Lymphs Abs: 2.5 10*3/uL (ref 0.7–4.0)
MCHC: 32.5 g/dL (ref 30.0–36.0)
MCV: 87.4 fl (ref 78.0–100.0)
Monocytes Absolute: 0.7 10*3/uL (ref 0.1–1.0)
Monocytes Relative: 7.1 % (ref 3.0–12.0)
NEUTROS PCT: 65.8 % (ref 43.0–77.0)
Neutro Abs: 6.5 10*3/uL (ref 1.4–7.7)
Platelets: 365 10*3/uL (ref 150.0–400.0)
RBC: 4.59 Mil/uL (ref 3.87–5.11)
RDW: 15.3 % (ref 11.5–15.5)
WBC: 9.9 10*3/uL (ref 4.0–10.5)

## 2014-03-28 NOTE — Progress Notes (Signed)
Pre visit review using our clinic review tool, if applicable. No additional management support is needed unless otherwise documented below in the visit note. 

## 2014-03-28 NOTE — Patient Instructions (Signed)
For your hip - let's do an xray today  Stop at check out for referral to a pain clinic  You can try taking 2 norco at a time for pain ( 2 pills twice daily as needed) -in the meantime  Get a cane and use it on the right side I am worried about malnourishment -please make an attempt to eat 3 meals per day and get ensure or boost - and drink one serving in between meals  Please consider getting back on your medicines  Also vitamin D - start on that for your bones We will re check your blood count today as well

## 2014-03-28 NOTE — Progress Notes (Signed)
Subjective:    Patient ID: Victoria Ball, female    DOB: 06/02/1925, 80 y.o.   MRN: [id number]  HPI Here for ongoing pain in her R leg/hip  Hurts on the outside and the inside  Is worse this week  No particular change in activity or trauma or falls   Has not had xray since May  Injection did not help  PT did not help   Would never consider a hip replacement   norco -takes it twice per day - does not help or make her dizzy or sleepy   Has not tried anything else for pain besides mobic   Has never been on tramadol   Wt is down further  Her bmi is in the 16 range  Pt states she does not know why she is loosing  Rev po intake-is low  Denies depression or other mood disorder/eating disorder/alcohol abuse or financial problems   Also rev her chol- is up  Lab Results  Component Value Date   CHOL 250* 01/20/2014   HDL 50.60 01/20/2014   LDLCALC 176* 01/20/2014   LDLDIRECT 118.8 12/14/2012   TRIG 119.0 01/20/2014   CHOLHDL 5 01/20/2014   She stopped med- "does not know why"  bp is up  today = pt again states she stopped med "? Why"  No cp or palpitations or headaches or edema  No side effects to medicines  BP Readings from Last 3 Encounters:  03/28/14 150/78  01/20/14 110/64  11/02/13 126/58     Again declines smoking   Patient Active Problem List   Diagnosis Date Noted  . Loss of weight 03/28/2014  . Leukocytosis 03/28/2014  . Osteoarthritis of both hips 09/26/2013  . Right hip pain 09/02/2013  . Encounter for Medicare annual wellness exam 12/20/2012  . History of central retinal vein occlusion   . HYPERTENSION, BENIGN ESSENTIAL 03/27/2009  . BACK PAIN 08/31/2008  . Vitamin D deficiency 02/07/2008  . HYPERCHOLESTEROLEMIA 01/27/2007  . RETINAL VEIN OCCLUSION 01/27/2007  . HIP PAIN, RIGHT, CHRONIC 01/27/2007  . Osteoporosis 01/27/2007   Past Medical History  Diagnosis Date  . OP (osteoporosis)     stopped Evista after 5 years  . HLD (hyperlipidemia)     . History of central retinal vein occlusion   . Hypertension    Past Surgical History  Procedure Laterality Date  . Abdominal hysterectomy  1994    fibroid tumor   History  Substance Use Topics  . Smoking status: Passive Smoke Exposure - Never Smoker  . Smokeless tobacco: Never Used  . Alcohol Use: No   Family History  Problem Relation Age of Onset  . Coronary artery disease Father   . Diabetes Father     ?   Marland Kitchen. Heart attack Sister [id number]  . Coronary artery disease Brother     CABG  . Coronary artery disease Brother     CABG  . Coronary artery disease Brother     CABG  . Angelman syndrome Paternal Grandfather    No Known Allergies Current Outpatient Prescriptions on File Prior to Visit  Medication Sig Dispense Refill  . amitriptyline (ELAVIL) 25 MG tablet Take 1-2 tablets by mouth at bedtime 180 tablet 3  . amLODipine (NORVASC) 5 MG tablet Take 1 tablet (5 mg total) by mouth daily. 90 tablet 3  . atorvastatin (LIPITOR) 10 MG tablet Take 1 tablet (10 mg total) by mouth daily. 90 tablet 3  . cholecalciferol (VITAMIN D) 1000 UNITS tablet Take  1,000 Units by mouth daily.      Marland Kitchen. HYDROcodone-acetaminophen (NORCO/VICODIN) 5-325 MG per tablet Take 1 tablet by mouth 2 (two) times daily as needed for severe pain. (Patient taking differently: Take 2 tablets by mouth 2 (two) times daily as needed for severe pain. ) 60 tablet 0   No current facility-administered medications on file prior to visit.     Review of Systems Review of Systems  Constitutional: Negative for fever, appetite change, fatigue and pos for unexpected weight change.  Eyes: Negative for pain and visual disturbance.  Respiratory: Negative for cough and shortness of breath.   Cardiovascular: Negative for cp or palpitations    Gastrointestinal: Negative for nausea, diarrhea and constipation.  Genitourinary: Negative for urgency and frequency.  Skin: Negative for pallor or rash   MSK pos for severe R hip pain and hx of  low back pain  Neurological: Negative for weakness, light-headedness, numbness and headaches.  Hematological: Negative for adenopathy. Does not bruise/bleed easily.  Psychiatric/Behavioral: Negative for dysphoric mood. The patient is not nervous/anxious.         Objective:   Physical Exam  Constitutional: She appears well-developed and well-nourished.  underwt elderly female/ seemingly depressed Smells heavily of smoke but again denies smoking or smoke exposure   HENT:  Head: Normocephalic and atraumatic.  Mouth/Throat: Oropharynx is clear and moist.  Eyes: Conjunctivae and EOM are normal. Pupils are equal, round, and reactive to light. No scleral icterus.  Neck: Normal range of motion. Neck supple. No JVD present. Carotid bruit is not present.  Cardiovascular: Normal rate, regular rhythm and normal heart sounds.  Exam reveals no gallop.   Mildly tachycardic   Pulmonary/Chest: Effort normal and breath sounds normal. No respiratory distress. She has no wheezes. She has no rales.  Abdominal: Soft. Bowel sounds are normal. She exhibits no distension and no mass. There is no tenderness.  Musculoskeletal: She exhibits tenderness. She exhibits no edema.  Tender R greater trochanter  Pain in groin on int and ext rot of hip  Gait affected (does not have a cane)  Pain on full flex of hip   LS - some mild bony tenderness   Lymphadenopathy:    She has no cervical adenopathy.  Neurological: She is alert. She has normal reflexes. She exhibits normal muscle tone.  Skin: Skin is warm and dry. No rash noted. No erythema. No pallor.  Psychiatric: Her speech is normal. Her mood appears anxious. Her affect is blunt. She is withdrawn. Thought content is not paranoid. Cognition and memory are normal. She exhibits a depressed mood. She expresses no homicidal and no suicidal ideation.  guarded and blunted affect today  Denies problems Very difficult to communicate with  Seems depressed but denies it  entirely  Denies abuse of lack of safety at home           Assessment & Plan:   Problem List Items Addressed This Visit      Cardiovascular and Mediastinum   HYPERTENSION, BENIGN ESSENTIAL    bp is up today because pt did not take her medication  Questioned compliance with meds - pt is very guarded about this and will not tell me why she does not take them - declines depression or forgetfulness Urged her to start back       Other   HIP PAIN, RIGHT, CHRONIC    Ongoing  She has seen both orthopedics and rheumatology = rev those notes  Tried mobic/injection and now on norco bid  States pain is severe-has not purchased a cane  No hx of trauma  Will re check xray today  Suspect OA Declines hip replacement or surgery of any kind  Wants stronger pain med - declines tramadol  States that inc dose of norco "will probably not work"- and disc risks of falls/etc - - we will refer her to a pain clinic  Adv that she can inc norco to 2 pills bid prn as needed just to see if it helps with caution of sedation and falls       Relevant Orders      DG Hip Complete Right (Completed)      Ambulatory referral to Pain Clinic   HYPERCHOLESTEROLEMIA    Disc goals for lipids and reasons to control them Rev labs with pt Rev low sat fat diet in detail This is up - she states she stopped cholesterol med but "does not know why" Is very guarded and will not talk about it further  Declines financial trouble or depression I urged her to start back    Leukocytosis    Re check this from last labs  No signs of depression     Relevant Orders      CBC with Differential (Completed)   Loss of weight - Primary    bmi is down into 16 range  Pt states she does not know why she is loosing - on further probing -it sounds like she eats very little  Rev proper diet and need for protein  Declines body image issue/ declines depression /declines financial problems entirely and denies alcohol intake  States  "I don't know why" in guarded fashion - very difficult to communicate with her about this issue  Rev diet with 3 meals and some protein shakes in between- she agreed to try  Close f/u -worried about further malnutrition     Vitamin D deficiency    Urged pt to get back on her vit D for bone and overall health  She cannot tell me why she stopped it  D level in the 20s

## 2014-03-29 ENCOUNTER — Other Ambulatory Visit: Payer: Medicare HMO

## 2014-03-29 NOTE — Assessment & Plan Note (Signed)
Urged pt to get back on her vit D for bone and overall health  She cannot tell me why she stopped it  D level in the 20s

## 2014-03-29 NOTE — Assessment & Plan Note (Signed)
bmi is down into 16 range  Pt states she does not know why she is loosing - on further probing -it sounds like she eats very little  Rev proper diet and need for protein  Declines body image issue/ declines depression /declines financial problems entirely and denies alcohol intake  States "I don't know why" in guarded fashion - very difficult to communicate with her about this issue  Rev diet with 3 meals and some protein shakes in between- she agreed to try  Close f/u -worried about further malnutrition

## 2014-03-29 NOTE — Assessment & Plan Note (Signed)
bp is up today because pt did not take her medication  Questioned compliance with meds - pt is very guarded about this and will not tell me why she does not take them - declines depression or forgetfulness Urged her to start back

## 2014-03-29 NOTE — Assessment & Plan Note (Signed)
Disc goals for lipids and reasons to control them Rev labs with pt Rev low sat fat diet in detail This is up - she states she stopped cholesterol med but "does not know why" Is very guarded and will not talk about it further  Declines financial trouble or depression I urged her to start back

## 2014-03-29 NOTE — Assessment & Plan Note (Signed)
Re check this from last labs  No signs of depression

## 2014-03-29 NOTE — Assessment & Plan Note (Signed)
Ongoing  She has seen both orthopedics and rheumatology = rev those notes  Tried mobic/injection and now on norco bid  States pain is severe-has not purchased a cane  No hx of trauma  Will re check xray today  Suspect OA Declines hip replacement or surgery of any kind  Wants stronger pain med - declines tramadol  States that inc dose of norco "will probably not work"- and disc risks of falls/etc - - we will refer her to a pain clinic  Adv that she can inc norco to 2 pills bid prn as needed just to see if it helps with caution of sedation and falls

## 2014-04-06 ENCOUNTER — Encounter (HOSPITAL_COMMUNITY): Payer: Self-pay | Admitting: Emergency Medicine

## 2014-04-06 ENCOUNTER — Emergency Department (HOSPITAL_COMMUNITY)
Admission: EM | Admit: 2014-04-06 | Discharge: 2014-04-06 | Disposition: A | Discharge status: Home / Self Care | Payer: Medicare HMO | Attending: Emergency Medicine | Admitting: Emergency Medicine

## 2014-04-06 DIAGNOSIS — M25551 Pain in right hip: Secondary | ICD-10-CM | POA: Insufficient documentation

## 2014-04-06 DIAGNOSIS — M1990 Unspecified osteoarthritis, unspecified site: Secondary | ICD-10-CM | POA: Insufficient documentation

## 2014-04-06 DIAGNOSIS — M25552 Pain in left hip: Secondary | ICD-10-CM | POA: Insufficient documentation

## 2014-04-06 DIAGNOSIS — G8929 Other chronic pain: Secondary | ICD-10-CM | POA: Diagnosis not present

## 2014-04-06 DIAGNOSIS — I10 Essential (primary) hypertension: Secondary | ICD-10-CM | POA: Insufficient documentation

## 2014-04-06 DIAGNOSIS — Z79899 Other long term (current) drug therapy: Secondary | ICD-10-CM | POA: Diagnosis not present

## 2014-04-06 DIAGNOSIS — E785 Hyperlipidemia, unspecified: Secondary | ICD-10-CM | POA: Insufficient documentation

## 2014-04-06 DIAGNOSIS — M25559 Pain in unspecified hip: Secondary | ICD-10-CM

## 2014-04-06 HISTORY — DX: Unspecified osteoarthritis, unspecified site: M19.90

## 2014-04-06 MED ORDER — OXYCODONE-ACETAMINOPHEN 5-325 MG PO TABS
1.0000 | ORAL_TABLET | Freq: Once | ORAL | Status: AC
  Administered 2014-04-06: 1 via ORAL
  Filled 2014-04-06: qty 1

## 2014-04-06 MED ORDER — OXYCODONE-ACETAMINOPHEN 5-325 MG PO TABS: 1.0000 | ORAL_TABLET | Freq: Four times a day (QID) | ORAL | Status: DC | PRN

## 2014-04-06 NOTE — ED Provider Notes (Signed)
Complains of bilateral hip pain for several months. Right worse than left. Patient has been seen by Dr. Milinda Antisower for same complaint has been prescribed Norco 2 tablets every 6 hours which does not control pain adequately. No trauma no fever no other associated symptoms. On exam patient is alert Glasgow Coma Score 15 nontoxic. Bilateral without deformity neurovascularly intact. She walks unassisted with slight limp favoring right lower extremity. I instructed patient to check with her primary care physician every few days in order to get updates on status of getting into pain clinic. Percocet prescribed.  Doug SouSam Helon Wisinski, MD 04/06/14 [phone]

## 2014-04-06 NOTE — ED Notes (Signed)
Patient states always hurts in her hips, but has worsened over the last few days.    Denies injury.   Denies falls.

## 2014-04-06 NOTE — ED Provider Notes (Signed)
CSN: [id number]     Arrival date & time 04/06/14  0913 History   First MD Initiated Contact with Patient 04/06/14 1102     Chief Complaint  Patient presents with  . Hip Pain     (Consider location/radiation/quality/duration/timing/severity/associated sxs/prior Treatment) HPI  Victoria Ball is a 80 y.o. female with history of hypertension or arthritis, presents to emergency department complaining of bilateral hip pain. Patient states her pain is chronic. She has seen her primary care doctor and orthopedic specialists for this in the past. She states her orthopedic specialist has given her cortisone injections which did not help. She states her primary care doctor has been treating her with hydrocodone, but states it is not helping either. She also states her primary care doctor has told her they cannot prescribe her anything stronger, and referral has been made to pain management. She is waiting to see them at this time. She states that in the last week her pain has progressively gotten worse, at this time she is only able to ambulate with a walker. Pain is worsened with movement of bilateral hips. She denies any pain in the back. She denies any pain in her extremities. No numbness or weakness in her legs. Denies any urinary or bowel incontinence or relation. No fever. She is currently not taking any for her pain. She denies any recent injuries. She states she was seen just a week ago and had x-rays done, which showed arthritis. She states "I just need something stronger for pain until I get in with pain management."   Past Medical History  Diagnosis Date  . OP (osteoporosis)     stopped Evista after 5 years  . HLD (hyperlipidemia)   . History of central retinal vein occlusion   . Hypertension   . Arthritis    Past Surgical History  Procedure Laterality Date  . Abdominal hysterectomy  1994    fibroid tumor   Family History  Problem Relation Age of Onset  . Coronary artery disease  Father   . Diabetes Father     ?   Marland Kitchen. Heart attack Sister 3070  . Coronary artery disease Brother     CABG  . Coronary artery disease Brother     CABG  . Coronary artery disease Brother     CABG  . Angelman syndrome Paternal Grandfather    History  Substance Use Topics  . Smoking status: Passive Smoke Exposure - Never Smoker  . Smokeless tobacco: Never Used  . Alcohol Use: No   OB History    No data available     Review of Systems  Constitutional: Negative for fever and chills.  Respiratory: Negative for cough, chest tightness and shortness of breath.   Cardiovascular: Negative for chest pain, palpitations and leg swelling.  Gastrointestinal: Negative for nausea, vomiting, abdominal pain and diarrhea.  Genitourinary: Negative for dysuria, flank pain, vaginal bleeding, vaginal discharge, vaginal pain and pelvic pain.  Musculoskeletal: Positive for arthralgias. Negative for neck pain and neck stiffness.  Skin: Negative for rash.  Neurological: Negative for dizziness, weakness, numbness and headaches.  All other systems reviewed and are negative.     Allergies  Review of patient's allergies indicates no known allergies.  Home Medications   Prior to Admission medications   Medication Sig Start Date End Date Taking? Authorizing Provider  amitriptyline (ELAVIL) 25 MG tablet Take 1-2 tablets by mouth at bedtime 01/20/14   Judy PimpleMarne A Tower, MD  amLODipine (NORVASC) 5 MG tablet Take  1 tablet (5 mg total) by mouth daily. 01/20/14   Judy PimpleMarne A Tower, MD  atorvastatin (LIPITOR) 10 MG tablet Take 1 tablet (10 mg total) by mouth daily. 01/20/14   Judy PimpleMarne A Tower, MD  cholecalciferol (VITAMIN D) 1000 UNITS tablet Take 1,000 Units by mouth daily.      Historical Provider, MD  HYDROcodone-acetaminophen (NORCO/VICODIN) 5-325 MG per tablet Take 1 tablet by mouth 2 (two) times daily as needed for severe pain. Patient taking differently: Take 2 tablets by mouth 2 (two) times daily as needed for severe  pain.  03/07/14   Marne A Tower, MD   BP 140/91 mmHg  Pulse 105  Temp(Src) 98.5 F (36.9 C) (Oral)  Resp 17  Ht 5\' 5"  (1.651 m)  Wt 100 lb (45.36 kg)  BMI 16.64 kg/m2  SpO2 95%  LMP 04/14/1977 Physical Exam  Constitutional: She is oriented to person, place, and time. She appears well-developed and well-nourished. No distress.  HENT:  Head: Normocephalic.  Eyes: Conjunctivae are normal.  Neck: Neck supple.  Cardiovascular: Normal rate, regular rhythm and normal heart sounds.   Pulmonary/Chest: Effort normal and breath sounds normal. No respiratory distress. She has no wheezes. She has no rales.  Abdominal: Soft. Bowel sounds are normal. She exhibits no distension. There is no tenderness. There is no rebound.  Musculoskeletal: She exhibits no edema.  No midline lumbar spine tenderness. Tender in bilateral hip jonts. Full ROM of the bilateral hips. Pain with bilateral flexion, internal and external rotation. DP pulses intact and equal bilaterally.   Neurological: She is alert and oriented to person, place, and time.  5/5 and equal lower extremity strength. 2+ and equal patellar reflexes bilaterally. Pt able to dorsiflex bilateral toes and feet with good strength against resistance. Equal sensation bilaterally over thighs and lower legs.   Skin: Skin is warm and dry.  Psychiatric: She has a normal mood and affect. Her behavior is normal.  Nursing note and vitals reviewed.   ED Course  Procedures (including critical care time) Labs Review Labs Reviewed - No data to display  Imaging Review No results found.   EKG Interpretation None      MDM   Final diagnoses:  Hip pain, chronic, unspecified laterality    Pt with chronic bilateral hip pain, here for pain management. Requesting "something stronger than vicodin."  Given percocet for pain in ED. Had a long discussion with pt regarding strong pain medications use and risk of fall, confusion, disorientation. Pt stated that she  understands risks but states at this point her life is not enjoyable in so much pain. She was ambulated in ED, discussed care with Dr. Ethelda ChickJacubowitz who has seen her as well. Discussed plan. Will d/c home with percocet 20 tab, follow up with PCP. She is neurovascularly intact, no evidence of cauda equina or infection. Stable for d/c home.   Pt hypertensive in ED, will follow up closely with PCP. Asymptomatic.    Filed Vitals:   04/06/14 0928 04/06/14 1127  BP: 140/91 170/93  Pulse: 105 96  Temp: 98.5 F (36.9 C)   TempSrc: Oral   Resp: 17 16  Height: 5\' 5"  (1.651 m)   Weight: 100 lb (45.36 kg)   SpO2: 95% 98%      Lottie Musselatyana A Quatisha Zylka, PA-C 04/06/14 1825  Doug SouSam Jacubowitz, MD 04/06/14 1918

## 2014-04-06 NOTE — Discharge Instructions (Signed)
Take percocet as prescribed for pain. Make sure to take a stool softner daily to prevent constipation. Follow up with Dr. Milinda Antisower and pain clinic as soon as able. We have emailed and sent Dr. Milinda Antisower records from your visit.   Hip Pain Your hip is the joint between your upper legs and your lower pelvis. The bones, cartilage, tendons, and muscles of your hip joint perform a lot of work each day supporting your body weight and allowing you to move around. Hip pain can range from a minor ache to severe pain in one or both of your hips. Pain may be felt on the inside of the hip joint near the groin, or the outside near the buttocks and upper thigh. You may have swelling or stiffness as well.  HOME CARE INSTRUCTIONS   Take medicines only as directed by your health care provider.  Apply ice to the injured area:  Put ice in a plastic bag.  Place a towel between your skin and the bag.  Leave the ice on for 15-20 minutes at a time, 3-4 times a day.  Keep your leg raised (elevated) when possible to lessen swelling.  Avoid activities that cause pain.  Follow specific exercises as directed by your health care provider.  Sleep with a pillow between your legs on your most comfortable side.  Record how often you have hip pain, the location of the pain, and what it feels like. SEEK MEDICAL CARE IF:   You are unable to put weight on your leg.  Your hip is red or swollen or very tender to touch.  Your pain or swelling continues or worsens after 1 week.  You have increasing difficulty walking.  You have a fever. SEEK IMMEDIATE MEDICAL CARE IF:   You have fallen.  You have a sudden increase in pain and swelling in your hip. MAKE SURE YOU:   Understand these instructions.  Will watch your condition.  Will get help right away if you are not doing well or get worse. Document Released: 09/18/2009 Document Revised: 08/15/2013 Document Reviewed: 11/25/2012 [facility] Patient Information 2015  [employer] [place]. This information is not intended to replace advice given to you by your health care provider. Make sure you discuss any questions you have with your health care provider.

## 2014-04-10 ENCOUNTER — Telehealth: Payer: Self-pay | Admitting: Family Medicine

## 2014-04-10 MED ORDER — OXYCODONE-ACETAMINOPHEN 5-325 MG PO TABS: 1.0000 | ORAL_TABLET | Freq: Four times a day (QID) | ORAL | Status: DC | PRN

## 2014-04-10 NOTE — Telephone Encounter (Signed)
The ER gave her percocet - did it help any more than the norco?- that could be a short term option until she gets an appointment

## 2014-04-10 NOTE — Telephone Encounter (Signed)
Pt called for status on Pain Clinic referral.  I called [facility], records are still in review and pt will not be scheduled until after 04/17/2014 at earliest.  Pt upset and wanted to know what she could do for pain medication until appointment is scheduled.  Please advise.  Best number to call patient is [phone] / lt

## 2014-04-10 NOTE — Telephone Encounter (Signed)
Px printed for pick up in IN box  Watch carefully for sedation with this and do not mix with her previous pain medicine

## 2014-04-10 NOTE — Telephone Encounter (Signed)
Pt said that the percocet did help a lot better the the norco and pt is okay with a Rx for percocet until she is seen by pain clinic

## 2014-04-11 NOTE — Telephone Encounter (Signed)
Pt called to ck on status of pain med rx. [place] will put at front desk for pick up now. Pt notified as instructed from phone note. Pt voiced understanding.

## 2014-04-11 NOTE — Telephone Encounter (Addendum)
Pt notified by Artelia Larocheena

## 2014-07-17 ENCOUNTER — Ambulatory Visit: Payer: Medicare HMO | Admitting: Family Medicine

## 2014-07-26 ENCOUNTER — Ambulatory Visit: Payer: Medicare HMO | Admitting: Family Medicine

## 2014-08-01 ENCOUNTER — Encounter (HOSPITAL_COMMUNITY): Payer: Self-pay | Admitting: *Deleted

## 2014-08-01 ENCOUNTER — Emergency Department (HOSPITAL_COMMUNITY): Payer: Medicare HMO

## 2014-08-01 ENCOUNTER — Emergency Department (HOSPITAL_COMMUNITY)
Admission: EM | Admit: 2014-08-01 | Discharge: 2014-08-01 | Disposition: A | Discharge status: Home / Self Care | Payer: Medicare HMO | Attending: Emergency Medicine | Admitting: Emergency Medicine

## 2014-08-01 DIAGNOSIS — Z79899 Other long term (current) drug therapy: Secondary | ICD-10-CM | POA: Insufficient documentation

## 2014-08-01 DIAGNOSIS — E785 Hyperlipidemia, unspecified: Secondary | ICD-10-CM | POA: Insufficient documentation

## 2014-08-01 DIAGNOSIS — G8929 Other chronic pain: Secondary | ICD-10-CM | POA: Insufficient documentation

## 2014-08-01 DIAGNOSIS — M25551 Pain in right hip: Secondary | ICD-10-CM | POA: Insufficient documentation

## 2014-08-01 DIAGNOSIS — M810 Age-related osteoporosis without current pathological fracture: Secondary | ICD-10-CM | POA: Diagnosis not present

## 2014-08-01 DIAGNOSIS — M1990 Unspecified osteoarthritis, unspecified site: Secondary | ICD-10-CM | POA: Diagnosis not present

## 2014-08-01 DIAGNOSIS — I10 Essential (primary) hypertension: Secondary | ICD-10-CM | POA: Insufficient documentation

## 2014-08-01 LAB — BASIC METABOLIC PANEL
ANION GAP: 9 (ref 5–15)
BUN: 22 mg/dL (ref 6–23)
CALCIUM: 9.3 mg/dL (ref 8.4–10.5)
CHLORIDE: 105 mmol/L (ref 96–112)
CO2: 22 mmol/L (ref 19–32)
CREATININE: 0.73 mg/dL (ref 0.50–1.10)
GFR calc Af Amer: 87 mL/min — ABNORMAL LOW (ref >90)
GFR, EST NON AFRICAN AMERICAN: 75 mL/min — AB (ref >90)
Glucose, Bld: 125 mg/dL — ABNORMAL HIGH (ref 70–99)
POTASSIUM: 4.2 mmol/L (ref 3.5–5.1)
SODIUM: 136 mmol/L (ref 135–145)

## 2014-08-01 LAB — CBC WITH DIFFERENTIAL/PLATELET
Basophils Absolute: 0 10*3/uL (ref 0.0–0.1)
Basophils Relative: 0 % (ref 0–1)
EOS ABS: 0 10*3/uL (ref 0.0–0.7)
EOS PCT: 0 % (ref 0–5)
HEMATOCRIT: 45 % (ref 36.0–46.0)
Hemoglobin: 14.8 g/dL (ref 12.0–15.0)
LYMPHS ABS: 2.2 10*3/uL (ref 0.7–4.0)
Lymphocytes Relative: 19 % (ref 12–46)
MCH: 29.2 pg (ref 26.0–34.0)
MCHC: 32.9 g/dL (ref 30.0–36.0)
MCV: 88.8 fL (ref 78.0–100.0)
MONO ABS: 0.6 10*3/uL (ref 0.1–1.0)
Monocytes Relative: 5 % (ref 3–12)
Neutro Abs: 8.9 10*3/uL — ABNORMAL HIGH (ref 1.7–7.7)
Neutrophils Relative %: 76 % (ref 43–77)
Platelets: 442 10*3/uL — ABNORMAL HIGH (ref 150–400)
RBC: 5.07 MIL/uL (ref 3.87–5.11)
RDW: 14.8 % (ref 11.5–15.5)
WBC: 11.8 10*3/uL — AB (ref 4.0–10.5)

## 2014-08-01 MED ORDER — HYDROMORPHONE HCL 1 MG/ML IJ SOLN
1.0000 mg | Freq: Once | INTRAMUSCULAR | Status: AC
  Administered 2014-08-01: 1 mg via INTRAVENOUS
  Filled 2014-08-01: qty 1

## 2014-08-01 MED ORDER — ONDANSETRON HCL 4 MG/2ML IJ SOLN
4.0000 mg | Freq: Once | INTRAMUSCULAR | Status: AC
  Administered 2014-08-01: 4 mg via INTRAVENOUS
  Filled 2014-08-01: qty 2

## 2014-08-01 NOTE — ED Notes (Signed)
Pt ambulating independently w/ steady gait on d/c in no acute distress, A&Ox4. D/c instructions reviewed w/ pt - pt denies any further questions or concerns at present.  

## 2014-08-01 NOTE — ED Notes (Signed)
Per [facility] - pt admits to she has run out of her 15mg  oxycodone IR x3 days ago, began experiencing rt hip pain x2 days, pain radiates down leg. Pt denies any mechanism of injury - pt is a pain clinic patient.

## 2014-08-01 NOTE — Discharge Instructions (Signed)

## 2014-08-01 NOTE — ED Provider Notes (Signed)
CSN: [id number]     Arrival date & time 08/01/14  0214 History   First MD Initiated Contact with Patient 08/01/14 0215     Chief Complaint  Patient presents with  . Hip Pain     (Consider location/radiation/quality/duration/timing/severity/associated sxs/prior Treatment) HPI Comments: Patient presents to the ER for evaluation of right hip pain. Patient reports a history of chronic pain secondary to osteoarthritis. She normally takes oxycodone IR, prescribed by her pain management specialist. Patient reports that she ran out of this medicine 3 days ago started having pain 2 days ago. The pain has progressively worsened and is now severe. Patient reports constant pain that worsens with movement of the hip. She denies any falls or injury.  Patient is a 81 y.o. female presenting with hip pain.  Hip Pain    Past Medical History  Diagnosis Date  . OP (osteoporosis)     stopped Evista after 5 years  . HLD (hyperlipidemia)   . History of central retinal vein occlusion   . Hypertension   . Arthritis    Past Surgical History  Procedure Laterality Date  . Abdominal hysterectomy  1994    fibroid tumor   Family History  Problem Relation Age of Onset  . Coronary artery disease Father   . Diabetes Father     ?   Marland Kitchen. Heart attack Sister [id number]  . Coronary artery disease Brother     CABG  . Coronary artery disease Brother     CABG  . Coronary artery disease Brother     CABG  . Angelman syndrome Paternal Grandfather    History  Substance Use Topics  . Smoking status: Passive Smoke Exposure - Never Smoker  . Smokeless tobacco: Never Used  . Alcohol Use: No   OB History    No data available     Review of Systems  Musculoskeletal: Positive for arthralgias.  All other systems reviewed and are negative.     Allergies  Review of patient's allergies indicates no known allergies.  Home Medications   Prior to Admission medications   Medication Sig Start Date End Date Taking?  Authorizing Provider  amitriptyline (ELAVIL) 25 MG tablet Take 1-2 tablets by mouth at bedtime 01/20/14   Judy PimpleMarne A Tower, MD  amLODipine (NORVASC) 5 MG tablet Take 1 tablet (5 mg total) by mouth daily. 01/20/14   Judy PimpleMarne A Tower, MD  atorvastatin (LIPITOR) 10 MG tablet Take 1 tablet (10 mg total) by mouth daily. Patient not taking: Reported on 04/06/2014 01/20/14   Judy PimpleMarne A Tower, MD  cholecalciferol (VITAMIN D) 1000 UNITS tablet Take 1,000 Units by mouth daily.      Historical Provider, MD  HYDROcodone-acetaminophen (NORCO/VICODIN) 5-325 MG per tablet Take 1 tablet by mouth 2 (two) times daily as needed for severe pain. Patient taking differently: Take 2 tablets by mouth 2 (two) times daily as needed for severe pain.  03/07/14   Judy PimpleMarne A Tower, MD  oxyCODONE-acetaminophen (PERCOCET) 5-325 MG per tablet Take 1 tablet by mouth every 6 (six) hours as needed for severe pain. 04/10/14   Judy PimpleMarne A Tower, MD   SpO2 97%  LMP 04/14/1977 Physical Exam  Constitutional: She is oriented to person, place, and time. She appears well-developed and well-nourished. No distress.  HENT:  Head: Normocephalic and atraumatic.  Right Ear: Hearing normal.  Left Ear: Hearing normal.  Nose: Nose normal.  Mouth/Throat: Oropharynx is clear and moist and mucous membranes are normal.  Eyes: Conjunctivae and EOM are normal. Pupils  are equal, round, and reactive to light.  Neck: Normal range of motion. Neck supple.  Cardiovascular: Regular rhythm, S1 normal and S2 normal.  Exam reveals no gallop and no friction rub.   No murmur heard. Pulmonary/Chest: Effort normal and breath sounds normal. No respiratory distress. She exhibits no tenderness.  Abdominal: Soft. Normal appearance and bowel sounds are normal. There is no hepatosplenomegaly. There is no tenderness. There is no rebound, no guarding, no tenderness at McBurney's point and negative Murphy's sign. No hernia.  Musculoskeletal:       Right hip: She exhibits decreased range  of motion (due to pain) and tenderness. She exhibits no deformity.  Neurological: She is alert and oriented to person, place, and time. She has normal strength. No cranial nerve deficit or sensory deficit. Coordination normal. GCS eye subscore is 4. GCS verbal subscore is 5. GCS motor subscore is 6.  Skin: Skin is warm, dry and intact. No rash noted. No cyanosis.  Psychiatric: She has a normal mood and affect. Her speech is normal and behavior is normal. Thought content normal.  Nursing note and vitals reviewed.   ED Course  Procedures (including critical care time) Labs Review Labs Reviewed - No data to display  Imaging Review No results found.   EKG Interpretation None      MDM   Final diagnoses:  None   chronic pain exacerbation  Patient presents to the ER for evaluation of right hip pain. Patient has a history of arthritis and is in pain management for her chronic pain. Patient reports that she ran out of her medication 3 days ago. Patient's pain is explained by the early withdrawal from her medications. There has not been any recent trauma. X-ray did not show any acute abnormality. Patient treated with IV Dilantin here in the ER. She will need to follow-up with her pain management specialist for further prescriptions.    Gilda Crease, MD 08/01/14 [phone]

## 2014-08-01 NOTE — ED Notes (Signed)
Bed: ZO10WA05 Expected date:  Expected time:  Means of arrival:  Comments: EMS 81 yo arthritic hip pain/out of pain meds

## 2014-08-08 ENCOUNTER — Ambulatory Visit (INDEPENDENT_AMBULATORY_CARE_PROVIDER_SITE_OTHER): Payer: Medicare HMO | Admitting: Family Medicine

## 2014-08-08 ENCOUNTER — Encounter: Payer: Self-pay | Admitting: Family Medicine

## 2014-08-08 VITALS — BP 126/72 | HR 86 | Temp 98.3°F | Ht 64.0 in | Wt 94.8 lb

## 2014-08-08 DIAGNOSIS — G4700 Insomnia, unspecified: Secondary | ICD-10-CM

## 2014-08-08 DIAGNOSIS — R630 Anorexia: Secondary | ICD-10-CM | POA: Diagnosis not present

## 2014-08-08 MED ORDER — AMITRIPTYLINE HCL 25 MG PO TABS: ORAL_TABLET | ORAL | Status: DC

## 2014-08-08 NOTE — Progress Notes (Signed)
Subjective:    Patient ID: Victoria Ball, female    DOB: 10/07/1925, 81 y.o.   MRN: [id number]  HPI Here with sleep problems   Is on pain med from the pain clinic - she takes 4 per day-not percocet  [facility] - has her px (does not remember what it is)  That has helped her pain - back and hip   Does not intend on a hip replacement and she is able to walk without a cane   Just can't sleep  Going on since she started pain issue- but is not pain causing problem  A recent dose of amitriptyline did help  No dizziness or other side effects   Goes to bed around 11 pm , lies there all night - may dose an hour at a time -- stays in bed - gets up around 7  Is very tired when she gets up  Only gets up to go to the bathroom  Caffeine - 1  Cup of coffee in am only  Does not drink enough water   BP Readings from Last 3 Encounters:  08/08/14 126/72  08/01/14 144/75  04/06/14 159/57     No appetite  Wt is down 5 lb with bmi of 15  She is trying to drink a lot of boost  Not depressed  ? Why no appetite   No stress Not feeling anxious or worried  In general - may take a short nap in the am (still does not sleep well)    Patient Active Problem List   Diagnosis Date Noted  . Appetite loss 08/08/2014  . Insomnia 08/08/2014  . Loss of weight 03/28/2014  . Leukocytosis 03/28/2014  . Osteoarthritis of both hips 09/26/2013  . Right hip pain 09/02/2013  . Encounter for Medicare annual wellness exam 12/20/2012  . History of central retinal vein occlusion   . HYPERTENSION, BENIGN ESSENTIAL 03/27/2009  . BACK PAIN 08/31/2008  . Vitamin D deficiency 02/07/2008  . HYPERCHOLESTEROLEMIA 01/27/2007  . RETINAL VEIN OCCLUSION 01/27/2007  . HIP PAIN, RIGHT, CHRONIC 01/27/2007  . Osteoporosis 01/27/2007   Past Medical History  Diagnosis Date  . OP (osteoporosis)     stopped Evista after 5 years  . HLD (hyperlipidemia)   . History of central retinal vein occlusion   . Hypertension   .  Arthritis    Past Surgical History  Procedure Laterality Date  . Abdominal hysterectomy  1994    fibroid tumor   History  Substance Use Topics  . Smoking status: Passive Smoke Exposure - Never Smoker  . Smokeless tobacco: Never Used  . Alcohol Use: No   Family History  Problem Relation Age of Onset  . Coronary artery disease Father   . Diabetes Father     ?   Marland Kitchen. Heart attack Sister [id number]  . Coronary artery disease Brother     CABG  . Coronary artery disease Brother     CABG  . Coronary artery disease Brother     CABG  . Angelman syndrome Paternal Grandfather    No Known Allergies Current Outpatient Prescriptions on File Prior to Visit  Medication Sig Dispense Refill  . cholecalciferol (VITAMIN D) 1000 UNITS tablet Take 1,000 Units by mouth daily.      Marland Kitchen. oxyCODONE-acetaminophen (PERCOCET) 5-325 MG per tablet Take 1 tablet by mouth every 6 (six) hours as needed for severe pain. 60 tablet 0  . amLODipine (NORVASC) 5 MG tablet Take 1 tablet (5 mg total) by  mouth daily. (Patient not taking: Reported on 08/08/2014) 90 tablet 3   No current facility-administered medications on file prior to visit.      Review of Systems    Review of Systems  Constitutional: Negative for fever, appetite change, fatigue and unexpected weight change.  Eyes: Negative for pain and visual disturbance.  Respiratory: Negative for cough and shortness of breath.   Cardiovascular: Negative for cp or palpitations    Gastrointestinal: Negative for nausea, diarrhea and constipation.  Genitourinary: Negative for urgency and frequency.  Skin: Negative for pallor or rash   MSK pos for chronic hip pain that is improved with tx from the pain clinic  Neurological: Negative for weakness, light-headedness, numbness and headaches.  Hematological: Negative for adenopathy. Does not bruise/bleed easily.  Psychiatric/Behavioral: Negative for dysphoric mood. The patient is not nervous/anxious.      Objective:    Physical Exam  Constitutional: She appears well-developed and well-nourished. No distress.  Underweight and well appearing   HENT:  Head: Normocephalic and atraumatic.  Mouth/Throat: Oropharynx is clear and moist.  Eyes: Conjunctivae and EOM are normal. Pupils are equal, round, and reactive to light.  Neck: Normal range of motion. Neck supple. No JVD present. Carotid bruit is not present. No thyromegaly present.  Cardiovascular: Normal rate, regular rhythm, normal heart sounds and intact distal pulses.  Exam reveals no gallop.   Pulmonary/Chest: Effort normal and breath sounds normal. No respiratory distress. She has no wheezes. She has no rales.  No crackles  Abdominal: Soft. Bowel sounds are normal. She exhibits no distension, no abdominal bruit and no mass. There is no tenderness.  Musculoskeletal: She exhibits no edema.  Pt is able to walk without a cane or walker   Lymphadenopathy:    She has no cervical adenopathy.  Neurological: She is alert. She has normal reflexes. She displays no tremor. No cranial nerve deficit. She exhibits normal muscle tone. Coordination normal.  Skin: Skin is warm and dry. No rash noted.  Psychiatric: She has a normal mood and affect.  Much improved mood today          Assessment & Plan:   Problem List Items Addressed This Visit      Other   Appetite loss - Primary    Will try elavil for this and insomnia  Disc poss side eff  Has tol well in the past  Update if no improvement  Disc dangers of being underwt - pt states she would not be aggreable to a feeding tube in the future       Insomnia    Ongoing in pt with chronic pain  Will re start elavil which has worked well for her in the past and also helps appetite  If not effective- disc poss of remeron  Disc sleep hygiene and medication safety  Discussed expectations of this medication including time to effectiveness and mechanism of action, also poss of side effects (early and late)-  including mental fuzziness, weight or appetite change, nausea and poss of worse dep or anxiety (even suicidal thoughts)  Pt voiced understanding and will stop med and update if this occurs

## 2014-08-08 NOTE — Patient Instructions (Signed)
I'm glad your pain is improved For sleep -try amitriptyline 25 to 50 mg at bedtime  If any problems or side effects let me know and use fall precautions  Let me know what pain medicine you are taking  Also stay away from caffeine as much as you can  Continue to work on getting more calories - regular meals and boost whenever possible  Follow up with me in 3 months

## 2014-08-10 NOTE — Assessment & Plan Note (Signed)
Ongoing in pt with chronic pain  Will re start elavil which has worked well for her in the past and also helps appetite  If not effective- disc poss of remeron  Disc sleep hygiene and medication safety  Discussed expectations of this medication including time to effectiveness and mechanism of action, also poss of side effects (early and late)- including mental fuzziness, weight or appetite change, nausea and poss of worse dep or anxiety (even suicidal thoughts)  Pt voiced understanding and will stop med and update if this occurs

## 2014-08-10 NOTE — Assessment & Plan Note (Signed)
Will try elavil for this and insomnia  Disc poss side eff  Has tol well in the past  Update if no improvement  Disc dangers of being underwt - pt states she would not be aggreable to a feeding tube in the future

## 2014-10-18 ENCOUNTER — Ambulatory Visit: Payer: Medicare HMO | Admitting: Family Medicine

## 2014-10-20 ENCOUNTER — Ambulatory Visit: Payer: Medicare HMO | Admitting: Family Medicine

## 2014-11-07 ENCOUNTER — Ambulatory Visit: Payer: Medicare HMO | Admitting: Family Medicine

## 2014-12-20 ENCOUNTER — Ambulatory Visit (INDEPENDENT_AMBULATORY_CARE_PROVIDER_SITE_OTHER): Payer: Medicare HMO | Admitting: Family Medicine

## 2014-12-20 ENCOUNTER — Encounter: Payer: Self-pay | Admitting: Family Medicine

## 2014-12-20 VITALS — BP 148/82 | HR 86 | Temp 98.3°F | Ht 64.0 in | Wt 100.2 lb

## 2014-12-20 DIAGNOSIS — E7800 Pure hypercholesterolemia, unspecified: Secondary | ICD-10-CM

## 2014-12-20 DIAGNOSIS — Z23 Encounter for immunization: Secondary | ICD-10-CM

## 2014-12-20 DIAGNOSIS — G4700 Insomnia, unspecified: Secondary | ICD-10-CM | POA: Diagnosis not present

## 2014-12-20 DIAGNOSIS — I10 Essential (primary) hypertension: Secondary | ICD-10-CM

## 2014-12-20 MED ORDER — HYDROCHLOROTHIAZIDE 25 MG PO TABS: 25.0000 mg | ORAL_TABLET | Freq: Every day | ORAL | Status: DC

## 2014-12-20 MED ORDER — AMITRIPTYLINE HCL 25 MG PO TABS: ORAL_TABLET | ORAL | Status: DC

## 2014-12-20 NOTE — Progress Notes (Signed)
Pre visit review using our clinic review tool, if applicable. No additional management support is needed unless otherwise documented below in the visit note. 

## 2014-12-20 NOTE — Assessment & Plan Note (Signed)
Pt does not know why she stopped chol med in the past  Will check lipids at next visit-she may be open to re starting if still high Disc goals for lipids and reasons to control them Rev labs with pt from last check  Rev low sat fat diet in detail

## 2014-12-20 NOTE — Assessment & Plan Note (Signed)
Much improved with amitriptyline 25 qhs Wt is also up /appetite improved Will continue this

## 2014-12-20 NOTE — Progress Notes (Signed)
Subjective:    Patient ID: Victoria Ball, female    DOB: 1926/03/06, 81 y.o.   MRN: [id number]  HPI Here for f/u of chronic medical problems  Wt is up 6 lb with bmi of 17 Is eating very well - appetite is good   Having problems with ankles and feet swelling  Has been sitting a lot  Does not eat a lot of salty food   Still taking amitriptyline at night - helping sleep a lot  25 mg each night   R hip is still painful  Goes to the pain clinic  No longer on percocet- is on hydrocodone   Is off her amlodipine  Stopped it - no particular reason    Used to be on cholesterol medicine- stopped that for no reason also   Lab Results  Component Value Date   CHOL 250* 01/20/2014   HDL 50.60 01/20/2014   LDLCALC 176* 01/20/2014   LDLDIRECT 118.8 12/14/2012   TRIG 119.0 01/20/2014   CHOLHDL 5 01/20/2014    Patient Active Problem List   Diagnosis Date Noted  . Appetite loss 08/08/2014  . Insomnia 08/08/2014  . Loss of weight 03/28/2014  . Leukocytosis 03/28/2014  . Osteoarthritis of both hips 09/26/2013  . Right hip pain 09/02/2013  . Encounter for Medicare annual wellness exam 12/20/2012  . History of central retinal vein occlusion   . HYPERTENSION, BENIGN ESSENTIAL 03/27/2009  . BACK PAIN 08/31/2008  . Vitamin D deficiency 02/07/2008  . HYPERCHOLESTEROLEMIA 01/27/2007  . RETINAL VEIN OCCLUSION 01/27/2007  . HIP PAIN, RIGHT, CHRONIC 01/27/2007  . Osteoporosis 01/27/2007   Past Medical History  Diagnosis Date  . OP (osteoporosis)     stopped Evista after 5 years  . HLD (hyperlipidemia)   . History of central retinal vein occlusion   . Hypertension   . Arthritis    Past Surgical History  Procedure Laterality Date  . Abdominal hysterectomy  1994    fibroid tumor   Social History  Substance Use Topics  . Smoking status: Passive Smoke Exposure - Never Smoker  . Smokeless tobacco: Never Used  . Alcohol Use: No   Family History  Problem Relation Age of Onset    . Coronary artery disease Father   . Diabetes Father     ?   Marland Kitchen Heart attack Sister 58  . Coronary artery disease Brother     CABG  . Coronary artery disease Brother     CABG  . Coronary artery disease Brother     CABG  . Angelman syndrome Paternal Grandfather    No Known Allergies Current Outpatient Prescriptions on File Prior to Visit  Medication Sig Dispense Refill  . cholecalciferol (VITAMIN D) 1000 UNITS tablet Take 1,000 Units by mouth daily.       No current facility-administered medications on file prior to visit.      Review of Systems Review of Systems  Constitutional: Negative for fever, appetite change, fatigue and unexpected weight change.  Eyes: Negative for pain and visual disturbance.  Respiratory: Negative for cough and shortness of breath.   Cardiovascular: Negative for cp or palpitations    Gastrointestinal: Negative for nausea, diarrhea and constipation.  Genitourinary: Negative for urgency and frequency.  Skin: Negative for pallor or rash   MSK pos for chronic severe hip pain  Neurological: Negative for weakness, light-headedness, numbness and headaches. pos for insomnia helped by elavil  Hematological: Negative for adenopathy. Does not bruise/bleed easily.  Psychiatric/Behavioral: Negative for  dysphoric mood. The patient is not nervous/anxious.         Objective:   Physical Exam  Constitutional: She appears well-developed and well-nourished. No distress.  underwt and well appearing elderly female   Pt smells very strongly of smoke and still denies smoking   HENT:  Head: Normocephalic and atraumatic.  Mouth/Throat: Oropharynx is clear and moist.  Eyes: Conjunctivae and EOM are normal. Pupils are equal, round, and reactive to light.  Neck: Normal range of motion. Neck supple. No JVD present. Carotid bruit is not present. No thyromegaly present.  Cardiovascular: Normal rate, regular rhythm, normal heart sounds and intact distal pulses.  Exam reveals  no gallop.   Pulmonary/Chest: Effort normal and breath sounds normal. No respiratory distress. She has no wheezes. She has no rales.  No crackles  Abdominal: Soft. Bowel sounds are normal. She exhibits no distension, no abdominal bruit and no mass. There is no tenderness.  Musculoskeletal: She exhibits edema.  Kyphosis noted   Poor rom R hip with limp  Mild /trace pitting edema in ankles   Lymphadenopathy:    She has no cervical adenopathy.  Neurological: She is alert. She has normal reflexes. No cranial nerve deficit. She exhibits normal muscle tone. Coordination normal.  Skin: Skin is warm and dry. No rash noted.  Psychiatric: She has a normal mood and affect.          Assessment & Plan:   Problem List Items Addressed This Visit      Cardiovascular and Mediastinum   HYPERTENSION, BENIGN ESSENTIAL    Pt stopped amlodipine - "no reason"  She is having some edema - likely due to venous insuff  Will change bp med to hctz and see if it helps both issues -disc poss side eff F/u 2 wk visit and labs       Relevant Medications   hydrochlorothiazide (HYDRODIURIL) 25 MG tablet     Other   HYPERCHOLESTEROLEMIA    Pt does not know why she stopped chol med in the past  Will check lipids at next visit-she may be open to re starting if still high Disc goals for lipids and reasons to control them Rev labs with pt from last check  Rev low sat fat diet in detail       Relevant Medications   hydrochlorothiazide (HYDRODIURIL) 25 MG tablet   Insomnia    Much improved with amitriptyline 25 qhs Wt is also up /appetite improved Will continue this        Other Visit Diagnoses    Need for influenza vaccination    -  Primary    Relevant Orders    Flu Vaccine QUAD 36+ mos PF IM (Fluarix & Fluzone Quad PF) (Completed)

## 2014-12-20 NOTE — Patient Instructions (Signed)
Stay off amlodipine  Start HCTZ - a fluid pill (diuretic) for blood pressure and swelling of feet - if any side effects or problems let me know  Continue the amitriptyline at night  Stay as active as as you can  Follow up with me in 2 weeks and we will check blood pressure/ swelling and do your labs that day   For cholesterol : Avoid red meat/ fried foods/ egg yolks/ fatty breakfast meats/ butter, cheese and high fat dairy/ and shellfish

## 2014-12-20 NOTE — Assessment & Plan Note (Signed)
Pt stopped amlodipine - "no reason"  She is having some edema - likely due to venous insuff  Will change bp med to hctz and see if it helps both issues -disc poss side eff F/u 2 wk visit and labs

## 2015-01-03 ENCOUNTER — Ambulatory Visit: Payer: Medicare HMO | Admitting: Family Medicine

## 2015-01-09 ENCOUNTER — Encounter: Payer: Self-pay | Admitting: Family Medicine

## 2015-01-09 ENCOUNTER — Ambulatory Visit (INDEPENDENT_AMBULATORY_CARE_PROVIDER_SITE_OTHER): Payer: Medicare HMO | Admitting: Family Medicine

## 2015-01-09 VITALS — BP 125/65 | HR 80 | Temp 97.5°F | Ht 64.0 in | Wt 101.0 lb

## 2015-01-09 DIAGNOSIS — E7800 Pure hypercholesterolemia, unspecified: Secondary | ICD-10-CM

## 2015-01-09 DIAGNOSIS — I10 Essential (primary) hypertension: Secondary | ICD-10-CM | POA: Diagnosis not present

## 2015-01-09 NOTE — Progress Notes (Signed)
Subjective:    Patient ID: Victoria Ball, female    DOB: 1925-07-02, 81 y.o.   MRN: [id number]  HPI Here for f/u of HTN and edema   Taking hctz - 25 mg -no side effects   BP Readings from Last 3 Encounters:  01/09/15 96/48  12/20/14 148/82  08/08/14 126/72    Has not checked outside the office   Watches salt in her diet  Stays active quite a bit   Sleeping ok - she can get by without amitriptyline some nights   Due for chol check On statin in the past -? Why she stopped it  Willing to re start if it is high again  Not a lot of fried food or fatty food in diet   Wt is stable    Patient Active Problem List   Diagnosis Date Noted  . Insomnia 08/08/2014  . Loss of weight 03/28/2014  . Leukocytosis 03/28/2014  . Osteoarthritis of both hips 09/26/2013  . Right hip pain 09/02/2013  . Encounter for Medicare annual wellness exam 12/20/2012  . History of central retinal vein occlusion   . HYPERTENSION, BENIGN ESSENTIAL 03/27/2009  . BACK PAIN 08/31/2008  . Vitamin D deficiency 02/07/2008  . HYPERCHOLESTEROLEMIA 01/27/2007  . RETINAL VEIN OCCLUSION 01/27/2007  . HIP PAIN, RIGHT, CHRONIC 01/27/2007  . Osteoporosis 01/27/2007   Past Medical History  Diagnosis Date  . OP (osteoporosis)     stopped Evista after 5 years  . HLD (hyperlipidemia)   . History of central retinal vein occlusion   . Hypertension   . Arthritis    Past Surgical History  Procedure Laterality Date  . Abdominal hysterectomy  1994    fibroid tumor   Social History  Substance Use Topics  . Smoking status: Passive Smoke Exposure - Never Smoker  . Smokeless tobacco: Never Used  . Alcohol Use: No   Family History  Problem Relation Age of Onset  . Coronary artery disease Father   . Diabetes Father     ?   Marland Kitchen Heart attack Sister 88  . Coronary artery disease Brother     CABG  . Coronary artery disease Brother     CABG  . Coronary artery disease Brother     CABG  . Angelman syndrome  Paternal Grandfather    No Known Allergies Current Outpatient Prescriptions on File Prior to Visit  Medication Sig Dispense Refill  . amitriptyline (ELAVIL) 25 MG tablet Take 1  tablet by mouth at bedtime 30 tablet 11  . cholecalciferol (VITAMIN D) 1000 UNITS tablet Take 1,000 Units by mouth daily.      . hydrochlorothiazide (HYDRODIURIL) 25 MG tablet Take 1 tablet (25 mg total) by mouth daily. 30 tablet 11   No current facility-administered medications on file prior to visit.     Review of Systems Review of Systems  Constitutional: Negative for fever, appetite change, fatigue and unexpected weight change.  Eyes: Negative for pain and visual disturbance.  Respiratory: Negative for cough and shortness of breath.   Cardiovascular: Negative for cp or palpitations    Gastrointestinal: Negative for nausea, diarrhea and constipation.  Genitourinary: Negative for urgency and frequency.  Skin: Negative for pallor or rash   Neurological: Negative for weakness, light-headedness, numbness and headaches.  Hematological: Negative for adenopathy. Does not bruise/bleed easily.  Psychiatric/Behavioral: Negative for dysphoric mood. The patient is not nervous/anxious.         Objective:   Physical Exam  Constitutional: She appears well-developed  and well-nourished. No distress.  underwt and well appearing   HENT:  Head: Normocephalic and atraumatic.  Mouth/Throat: Oropharynx is clear and moist.  Eyes: Conjunctivae and EOM are normal. Pupils are equal, round, and reactive to light.  Neck: Normal range of motion. Neck supple. No JVD present. Carotid bruit is not present. No thyromegaly present.  Cardiovascular: Normal rate, regular rhythm, normal heart sounds and intact distal pulses.  Exam reveals no gallop.   Pulmonary/Chest: Effort normal and breath sounds normal. No respiratory distress. She has no wheezes. She has no rales.  No crackles  Abdominal: Soft. Bowel sounds are normal. She exhibits  no distension, no abdominal bruit and no mass. There is no tenderness.  Musculoskeletal: She exhibits no edema.  Lymphadenopathy:    She has no cervical adenopathy.  Neurological: She is alert. She has normal reflexes.  Skin: Skin is warm and dry. No rash noted.  Psychiatric: She has a normal mood and affect.          Assessment & Plan:   Problem List Items Addressed This Visit      Cardiovascular and Mediastinum   HYPERTENSION, BENIGN ESSENTIAL - Primary    Improved with hctz 25 BP: 125/65 mmHg  Will continue this  Lab today  Rev low sodium diet Also helping with her pedal edema       Relevant Orders   CBC with Differential/Platelet   Comprehensive metabolic panel   TSH   Lipid panel     Other   HYPERCHOLESTEROLEMIA    Disc goals for lipids and reasons to control them Rev labs with pt Rev low sat fat diet in detail Lab today- if cholesterol is back up she is open to going back on statin medication       Relevant Orders   Lipid panel

## 2015-01-09 NOTE — Assessment & Plan Note (Signed)
Disc goals for lipids and reasons to control them Rev labs with pt Rev low sat fat diet in detail Lab today- if cholesterol is back up she is open to going back on statin medication

## 2015-01-09 NOTE — Progress Notes (Signed)
Pre visit review using our clinic review tool, if applicable. No additional management support is needed unless otherwise documented below in the visit note. 

## 2015-01-09 NOTE — Assessment & Plan Note (Signed)
Improved with hctz 25 BP: 125/65 mmHg  Will continue this  Lab today  Rev low sodium diet Also helping with her pedal edema

## 2015-01-09 NOTE — Patient Instructions (Signed)
Continue the HCTZ  Labs today  Also checking cholesterol with labs  If cholesterol is high we will start back with cholesterol medicine  Keep eating regular meals   Take care of yourself

## 2015-01-10 LAB — CBC WITH DIFFERENTIAL/PLATELET
Basophils Absolute: 0.1 10*3/uL (ref 0.0–0.1)
Basophils Relative: 1.1 % (ref 0.0–3.0)
EOS ABS: 0.1 10*3/uL (ref 0.0–0.7)
Eosinophils Relative: 1.6 % (ref 0.0–5.0)
HCT: 38.1 % (ref 36.0–46.0)
HEMOGLOBIN: 12.8 g/dL (ref 12.0–15.0)
LYMPHS PCT: 30.2 % (ref 12.0–46.0)
Lymphs Abs: 2.7 10*3/uL (ref 0.7–4.0)
MCHC: 33.5 g/dL (ref 30.0–36.0)
MCV: 88.8 fl (ref 78.0–100.0)
MONO ABS: 0.7 10*3/uL (ref 0.1–1.0)
Monocytes Relative: 7.7 % (ref 3.0–12.0)
Neutro Abs: 5.4 10*3/uL (ref 1.4–7.7)
Neutrophils Relative %: 59.4 % (ref 43.0–77.0)
Platelets: 367 10*3/uL (ref 150.0–400.0)
RBC: 4.29 Mil/uL (ref 3.87–5.11)
RDW: 14 % (ref 11.5–15.5)
WBC: 9 10*3/uL (ref 4.0–10.5)

## 2015-01-10 LAB — COMPREHENSIVE METABOLIC PANEL
ALBUMIN: 3.5 g/dL (ref 3.5–5.2)
ALK PHOS: 85 U/L (ref 39–117)
ALT: 14 U/L (ref 0–35)
AST: 19 U/L (ref 0–37)
BUN: 21 mg/dL (ref 6–23)
CO2: 35 mEq/L — ABNORMAL HIGH (ref 19–32)
CREATININE: 1.33 mg/dL — AB (ref 0.40–1.20)
Calcium: 9 mg/dL (ref 8.4–10.5)
Chloride: 99 mEq/L (ref 96–112)
GFR: 40.12 mL/min — ABNORMAL LOW (ref >60.00)
Glucose, Bld: 102 mg/dL — ABNORMAL HIGH (ref 70–99)
Potassium: 4.2 mEq/L (ref 3.5–5.1)
SODIUM: 139 meq/L (ref 135–145)
TOTAL PROTEIN: 6.3 g/dL (ref 6.0–8.3)
Total Bilirubin: 0.4 mg/dL (ref 0.2–1.2)

## 2015-01-10 LAB — LIPID PANEL
Cholesterol: 196 mg/dL (ref 0–200)
HDL: 55.5 mg/dL (ref >39.00)
LDL Cholesterol: 116 mg/dL — ABNORMAL HIGH (ref 0–99)
NonHDL: 140.49
Total CHOL/HDL Ratio: 4
Triglycerides: 121 mg/dL (ref 0.0–149.0)
VLDL: 24.2 mg/dL (ref 0.0–40.0)

## 2015-01-10 LAB — TSH: TSH: 1.58 u[IU]/mL (ref 0.35–4.50)

## 2015-01-12 ENCOUNTER — Encounter: Payer: Self-pay | Admitting: *Deleted

## 2015-01-23 ENCOUNTER — Ambulatory Visit (INDEPENDENT_AMBULATORY_CARE_PROVIDER_SITE_OTHER): Payer: Medicare HMO | Admitting: Family Medicine

## 2015-01-23 ENCOUNTER — Encounter: Payer: Self-pay | Admitting: Family Medicine

## 2015-01-23 VITALS — BP 128/56 | HR 82 | Temp 98.8°F | Ht 64.0 in | Wt 100.5 lb

## 2015-01-23 DIAGNOSIS — M25551 Pain in right hip: Secondary | ICD-10-CM

## 2015-01-23 NOTE — Progress Notes (Signed)
Pre visit review using our clinic review tool, if applicable. No additional management support is needed unless otherwise documented below in the visit note. 

## 2015-01-23 NOTE — Progress Notes (Signed)
Subjective:    Patient ID: Victoria Ball, female    DOB: 15-Oct-1925, 81 y.o.   MRN: [id number]  HPI Here for arthritis pain   She [facility] -just does not want to go back (not worth what it is costing and ins does not pay)  Takes hydrocodone - ? What dose     Arthritis in hip -not as bad as it was  Pain is in the R groin for the most part occ in the buttock    Next visit is due for [facility] - ? Soon   Patient Active Problem List   Diagnosis Date Noted  . Insomnia 08/08/2014  . Loss of weight 03/28/2014  . Leukocytosis 03/28/2014  . Osteoarthritis of both hips 09/26/2013  . Right hip pain 09/02/2013  . Encounter for Medicare annual wellness exam 12/20/2012  . History of central retinal vein occlusion   . HYPERTENSION, BENIGN ESSENTIAL 03/27/2009  . BACK PAIN 08/31/2008  . Vitamin D deficiency 02/07/2008  . HYPERCHOLESTEROLEMIA 01/27/2007  . RETINAL VEIN OCCLUSION 01/27/2007  . HIP PAIN, RIGHT, CHRONIC 01/27/2007  . Osteoporosis 01/27/2007   Past Medical History  Diagnosis Date  . OP (osteoporosis)     stopped Evista after 5 years  . HLD (hyperlipidemia)   . History of central retinal vein occlusion   . Hypertension   . Arthritis    Past Surgical History  Procedure Laterality Date  . Abdominal hysterectomy  1994    fibroid tumor   Social History  Substance Use Topics  . Smoking status: Passive Smoke Exposure - Never Smoker  . Smokeless tobacco: Never Used  . Alcohol Use: No   Family History  Problem Relation Age of Onset  . Coronary artery disease Father   . Diabetes Father     ?   Marland Kitchen Heart attack Sister 33  . Coronary artery disease Brother     CABG  . Coronary artery disease Brother     CABG  . Coronary artery disease Brother     CABG  . Angelman syndrome Paternal Grandfather    No Known Allergies Current Outpatient Prescriptions on File Prior to Visit  Medication Sig Dispense Refill    . amitriptyline (ELAVIL) 25 MG tablet Take 1  tablet by mouth at bedtime 30 tablet 11  . cholecalciferol (VITAMIN D) 1000 UNITS tablet Take 1,000 Units by mouth daily.      . hydrochlorothiazide (HYDRODIURIL) 25 MG tablet Take 1 tablet (25 mg total) by mouth daily. 30 tablet 11   No current facility-administered medications on file prior to visit.    Review of Systems Review of Systems  Constitutional: Negative for fever, appetite change, fatigue and unexpected weight change.  Eyes: Negative for pain and visual disturbance.  Respiratory: Negative for cough and shortness of breath.   Cardiovascular: Negative for cp or palpitations    Gastrointestinal: Negative for nausea, diarrhea and constipation.  Genitourinary: Negative for urgency and frequency.  Skin: Negative for pallor or rash   MSK pos for chronic hip pain worse on R with limited mobility  Neurological: Negative for weakness, light-headedness, numbness and headaches.  Hematological: Negative for adenopathy. Does not bruise/bleed easily.  Psychiatric/Behavioral: Negative for dysphoric mood. The patient is not nervous/anxious.         Objective:   Physical Exam  Constitutional: She appears well-developed and well-nourished. No distress.  Underweight elderly female in no distress  HENT:  Head: Normocephalic and  atraumatic.  Musculoskeletal:  Poor rom of R hip/ with limp upon walking  Neurological: She is alert.  Skin: Skin is warm and dry. No rash noted.  Psychiatric: Her mood appears anxious.  Pt seems mildly anxious Gets mildly upset when I explained the process of pain medication maintenance           Assessment & Plan:   Problem List Items Addressed This Visit      Other   Right hip pain - Primary    Chronic with OA and pt will not consider surgery  She is currently being treated [facility] clin[facility] ([facility])-and thinks she is getting hydrocodone but is not sure of the dose - she would like to  change px to me since copays are high there  I inf her that I would most likely not be able to give her anything as strong as they do , in addition we would still need to continue random drug screening  I would also need to review her records from the clinic and get a letter transferring care to me if she wants to do so  She seemed disappointed  Will send for records to review them first

## 2015-01-23 NOTE — Patient Instructions (Signed)
I will send for last 2 notes and drug screen from [facility] and review them  Then I can tell you what I can offer for pain management  If you end up changing pain management to me then I will require a letter from them so they are aware  We will still have to do drug screens  If you change your mind and do not want to switch please let me know   I likely would not be able to give you as strong a pain med as they are

## 2015-01-25 NOTE — Assessment & Plan Note (Signed)
Chronic with OA and pt will not consider surgery  She is currently being treated at a pain clin[facility] ([facility])-and thinks she is getting hydrocodone but is not sure of the dose - she would like to change px to me since copays are high there  I inf her that I would most likely not be able to give her anything as strong as they do , in addition we would still need to continue random drug screening  I would also need to review her records from the clinic and get a letter transferring care to me if she wants to do so  She seemed disappointed  Will send for records to review them first

## 2015-01-26 ENCOUNTER — Telehealth: Payer: Self-pay | Admitting: Family Medicine

## 2015-01-26 NOTE — Telephone Encounter (Signed)
Please let pt know that I rec her records [facility]  They are px her oxycodone 15 mg three times daily  I was previously giving her hydrocodone (this is much less strong than the oxycodone)- I do not feel comfortable px oxycodone chronically  In addition to being a weaker med - it was only 5 mg   I feel if we did switch back to what I was giving her - it would not come close to controlling her pain , (not a good idea)  I suggest staying with her pain clinic and discussing any other pain control options as well if what they are giving her does not cover her pain   Thanks

## 2015-01-26 NOTE — Telephone Encounter (Signed)
Left voicemail requesting pt to call office back on Monday 

## 2015-02-05 NOTE — Telephone Encounter (Signed)
Pt notified of Dr. Royden Purlower's comment's and recommendations. Pt said "we'll see and I will let you know when I make up my mind" pt advise to f/u with pain clinic to discuss alt treatment for her pain

## 2015-03-21 ENCOUNTER — Encounter: Payer: Self-pay | Admitting: Family Medicine

## 2015-03-21 ENCOUNTER — Ambulatory Visit (INDEPENDENT_AMBULATORY_CARE_PROVIDER_SITE_OTHER): Payer: Medicare HMO | Admitting: Family Medicine

## 2015-03-21 VITALS — BP 144/78 | HR 106 | Temp 98.2°F | Ht 64.0 in | Wt 100.8 lb

## 2015-03-21 DIAGNOSIS — I10 Essential (primary) hypertension: Secondary | ICD-10-CM | POA: Diagnosis not present

## 2015-03-21 DIAGNOSIS — K5909 Other constipation: Secondary | ICD-10-CM

## 2015-03-21 DIAGNOSIS — M25551 Pain in right hip: Secondary | ICD-10-CM | POA: Diagnosis not present

## 2015-03-21 DIAGNOSIS — R636 Underweight: Secondary | ICD-10-CM | POA: Diagnosis not present

## 2015-03-21 DIAGNOSIS — R748 Abnormal levels of other serum enzymes: Secondary | ICD-10-CM

## 2015-03-21 DIAGNOSIS — K5900 Constipation, unspecified: Secondary | ICD-10-CM | POA: Insufficient documentation

## 2015-03-21 DIAGNOSIS — R7989 Other specified abnormal findings of blood chemistry: Secondary | ICD-10-CM | POA: Insufficient documentation

## 2015-03-21 NOTE — Assessment & Plan Note (Signed)
BP Readings from Last 3 Encounters:  03/21/15 144/78  01/23/15 128/56  01/09/15 125/65   bp goes up when she has more pain or exertion

## 2015-03-21 NOTE — Assessment & Plan Note (Signed)
Pt has a lifelong poor appetite  Rev diet- getting very few calories Outlined a plan for three meals per day with protein/carb and a fruit or vegetable or both (this should also help her constipation)

## 2015-03-21 NOTE — Assessment & Plan Note (Signed)
Pt takes oxycontin 15 mg tid per the pain clinic-chose to stay there

## 2015-03-21 NOTE — Progress Notes (Signed)
Pre visit review using our clinic review tool, if applicable. No additional management support is needed unless otherwise documented below in the visit note. 

## 2015-03-21 NOTE — Assessment & Plan Note (Signed)
On chronic oxycodone Also poor po intake Also poor fluid intake   Outlined a plan for 3 meals per day with protein/fruit and veg  Prunes/prune juice Handout on dietary fiber and constipation  Inc water intake to 8 servings per day Can also try miralax daily  Will update if no improvement

## 2015-03-21 NOTE — Patient Instructions (Signed)
Try to eat 3 meals daily  Each meal should have a protein (nuts/ meat/dairy/ soy), and a fruit or vegetable or both  All fruits and vegetables are good (but bananas may constipate you more)  Also increase water to at least 8 cups per day  Prunes and prune juice are excellent for constipation also   Also you can get miralax (or the store brand) and mix a cap ful with water or other fluid once daily -this will help also   If this does not work over the next several weeks please let me know   In addition - you need more fluid for your kidneys anyway   Schedule labs in 4-6 weeks to re check kidney function with increased water intake

## 2015-03-21 NOTE — Progress Notes (Signed)
Subjective:    Patient ID: Victoria Ball, female    DOB: 1926/01/30, 81 y.o.   MRN: [id number]  HPI Here with constipation   She still takes oxycodone from the pain clinic  She tries different things   Has tried fiber supplement -helps occasionally   Does not eat fruits and vegetables  - even though she likes them  Does not drink enough water  She likes prunes and prune juice   Has not tried miralax over the counter  Stool softeners do not help   Averages a bowel movement 1-2 times per week  Had to use an enema once (fleet)  No n/v  No abd pain - just uncomfortable   Very slim - underweight  BMI is 17-low  Am - coffee and donut  Lunch -sandwich (usually pimento cheese or [place])  Dinner - does not eat  Thinks she can do better  Not much of an appetite   She drinks a soft drink occ  No tea or juice   Patient Active Problem List   Diagnosis Date Noted  . Underweight 03/21/2015  . Insomnia 08/08/2014  . Loss of weight 03/28/2014  . Leukocytosis 03/28/2014  . Osteoarthritis of both hips 09/26/2013  . Right hip pain 09/02/2013  . Encounter for Medicare annual wellness exam 12/20/2012  . History of central retinal vein occlusion   . HYPERTENSION, BENIGN ESSENTIAL 03/27/2009  . BACK PAIN 08/31/2008  . Vitamin D deficiency 02/07/2008  . HYPERCHOLESTEROLEMIA 01/27/2007  . RETINAL VEIN OCCLUSION 01/27/2007  . HIP PAIN, RIGHT, CHRONIC 01/27/2007  . Osteoporosis 01/27/2007   Past Medical History  Diagnosis Date  . OP (osteoporosis)     stopped Evista after 5 years  . HLD (hyperlipidemia)   . History of central retinal vein occlusion   . Hypertension   . Arthritis    Past Surgical History  Procedure Laterality Date  . Abdominal hysterectomy  1994    fibroid tumor   Social History  Substance Use Topics  . Smoking status: Passive Smoke Exposure - Never Smoker  . Smokeless tobacco: Never Used  . Alcohol Use: No   Family History  Problem Relation Age of  Onset  . Coronary artery disease Father   . Diabetes Father     ?   Marland Kitchen. Heart attack Sister [id number]  . Coronary artery disease Brother     CABG  . Coronary artery disease Brother     CABG  . Coronary artery disease Brother     CABG  . Angelman syndrome Paternal Grandfather    No Known Allergies Current Outpatient Prescriptions on File Prior to Visit  Medication Sig Dispense Refill  . amitriptyline (ELAVIL) 25 MG tablet Take 1  tablet by mouth at bedtime 30 tablet 11  . cholecalciferol (VITAMIN D) 1000 UNITS tablet Take 1,000 Units by mouth daily.      . hydrochlorothiazide (HYDRODIURIL) 25 MG tablet Take 1 tablet (25 mg total) by mouth daily. 30 tablet 11   No current facility-administered medications on file prior to visit.      Review of Systems    Review of Systems  Constitutional: Negative for fever, appetite change, fatigue and unexpected weight change.  Eyes: Negative for pain and visual disturbance.  Respiratory: Negative for cough and shortness of breath.   Cardiovascular: Negative for cp or palpitations    Gastrointestinal: Negative for nausea, diarrhea and pos for  constipation. neg for abd pain or blood in stool Genitourinary: Negative for urgency  and frequency.  Skin: Negative for pallor or rash   MSK pos for chronic hip pain  Neurological: Negative for weakness, light-headedness, numbness and headaches.  Hematological: Negative for adenopathy. Does not bruise/bleed easily.  Psychiatric/Behavioral: Negative for dysphoric mood. The patient is not nervous/anxious.      Objective:   Physical Exam  Constitutional: She appears well-developed and well-nourished. No distress.  underwt and well app  HENT:  Head: Normocephalic and atraumatic.  Mouth/Throat: Oropharynx is clear and moist.  Eyes: Conjunctivae and EOM are normal. Pupils are equal, round, and reactive to light. No scleral icterus.  Neck: Normal range of motion. Neck supple.  Cardiovascular: Normal rate,  regular rhythm and normal heart sounds.   Pulmonary/Chest: Effort normal and breath sounds normal. No respiratory distress. She has no wheezes. She has no rales.  Abdominal: Soft. Bowel sounds are normal. She exhibits no distension and no mass. There is no tenderness. There is no rebound and no guarding.  Musculoskeletal:  Favors L leg due to OA of R hip   Lymphadenopathy:    She has no cervical adenopathy.  Neurological: She is alert.  Skin: Skin is warm and dry. No erythema. No pallor.  Psychiatric: She has a normal mood and affect.  Nursing note and vitals reviewed.         Assessment & Plan:   Problem List Items Addressed This Visit      Cardiovascular and Mediastinum   HYPERTENSION, BENIGN ESSENTIAL    BP Readings from Last 3 Encounters:  03/21/15 144/78  01/23/15 128/56  01/09/15 125/65   bp goes up when she has more pain or exertion         Digestive   Constipation - Primary    On chronic oxycodone Also poor po intake Also poor fluid intake   Outlined a plan for 3 meals per day with protein/fruit and veg  Prunes/prune juice Handout on dietary fiber and constipation  Inc water intake to 8 servings per day Can also try miralax daily  Will update if no improvement         Other   Elevated serum creatinine   Relevant Orders   Renal function panel   Right hip pain    Pt takes oxycontin 15 mg tid per the pain clinic-chose to stay there      Underweight    Pt has a lifelong poor appetite  Rev diet- getting very few calories Outlined a plan for three meals per day with protein/carb and a fruit or vegetable or both (this should also help her constipation)

## 2015-05-15 ENCOUNTER — Emergency Department (HOSPITAL_COMMUNITY): Payer: Medicare HMO

## 2015-05-15 ENCOUNTER — Inpatient Hospital Stay (HOSPITAL_COMMUNITY): Payer: Medicare HMO

## 2015-05-15 ENCOUNTER — Inpatient Hospital Stay (HOSPITAL_COMMUNITY)
Admission: EM | Admit: 2015-05-15 | Discharge: 2015-05-18 | DRG: 086 | Disposition: A | Discharge status: Home / Self Care | Payer: Medicare HMO | Attending: Internal Medicine | Admitting: Internal Medicine

## 2015-05-15 ENCOUNTER — Encounter (HOSPITAL_COMMUNITY): Payer: Self-pay | Admitting: Family Medicine

## 2015-05-15 DIAGNOSIS — G4089 Other seizures: Secondary | ICD-10-CM | POA: Diagnosis not present

## 2015-05-15 DIAGNOSIS — R4701 Aphasia: Secondary | ICD-10-CM | POA: Diagnosis not present

## 2015-05-15 DIAGNOSIS — M1990 Unspecified osteoarthritis, unspecified site: Secondary | ICD-10-CM | POA: Diagnosis present

## 2015-05-15 DIAGNOSIS — M25559 Pain in unspecified hip: Secondary | ICD-10-CM | POA: Diagnosis present

## 2015-05-15 DIAGNOSIS — S065X9A Traumatic subdural hemorrhage with loss of consciousness of unspecified duration, initial encounter: Secondary | ICD-10-CM

## 2015-05-15 DIAGNOSIS — W08XXXA Fall from other furniture, initial encounter: Secondary | ICD-10-CM | POA: Diagnosis present

## 2015-05-15 DIAGNOSIS — E785 Hyperlipidemia, unspecified: Secondary | ICD-10-CM | POA: Diagnosis present

## 2015-05-15 DIAGNOSIS — I6200 Nontraumatic subdural hemorrhage, unspecified: Secondary | ICD-10-CM | POA: Diagnosis not present

## 2015-05-15 DIAGNOSIS — I10 Essential (primary) hypertension: Secondary | ICD-10-CM | POA: Diagnosis present

## 2015-05-15 DIAGNOSIS — R569 Unspecified convulsions: Secondary | ICD-10-CM | POA: Diagnosis present

## 2015-05-15 DIAGNOSIS — M25551 Pain in right hip: Secondary | ICD-10-CM

## 2015-05-15 DIAGNOSIS — Z66 Do not resuscitate: Secondary | ICD-10-CM | POA: Diagnosis present

## 2015-05-15 DIAGNOSIS — M810 Age-related osteoporosis without current pathological fracture: Secondary | ICD-10-CM | POA: Diagnosis present

## 2015-05-15 DIAGNOSIS — G8929 Other chronic pain: Secondary | ICD-10-CM | POA: Diagnosis present

## 2015-05-15 DIAGNOSIS — S065X0A Traumatic subdural hemorrhage without loss of consciousness, initial encounter: Principal | ICD-10-CM | POA: Diagnosis present

## 2015-05-15 DIAGNOSIS — S065XAA Traumatic subdural hemorrhage with loss of consciousness status unknown, initial encounter: Secondary | ICD-10-CM

## 2015-05-15 DIAGNOSIS — S0990XA Unspecified injury of head, initial encounter: Secondary | ICD-10-CM | POA: Diagnosis not present

## 2015-05-15 DIAGNOSIS — Z8679 Personal history of other diseases of the circulatory system: Secondary | ICD-10-CM | POA: Diagnosis present

## 2015-05-15 DIAGNOSIS — R400 Somnolence: Secondary | ICD-10-CM | POA: Diagnosis present

## 2015-05-15 DIAGNOSIS — L8990 Pressure ulcer of unspecified site, unspecified stage: Secondary | ICD-10-CM

## 2015-05-15 HISTORY — DX: Traumatic subdural hemorrhage with loss of consciousness of unspecified duration, initial encounter: S06.5X9A

## 2015-05-15 HISTORY — DX: Traumatic subdural hemorrhage with loss of consciousness status unknown, initial encounter: S06.5XAA

## 2015-05-15 LAB — BASIC METABOLIC PANEL
ANION GAP: 9 (ref 5–15)
BUN: 18 mg/dL (ref 6–20)
CHLORIDE: 97 mmol/L — AB (ref 101–111)
CO2: 29 mmol/L (ref 22–32)
Calcium: 8.9 mg/dL (ref 8.9–10.3)
Creatinine, Ser: 0.94 mg/dL (ref 0.44–1.00)
GFR calc Af Amer: >60 mL/min (ref >60)
GFR calc non Af Amer: 53 mL/min — ABNORMAL LOW (ref >60)
GLUCOSE: 126 mg/dL — AB (ref 65–99)
POTASSIUM: 3.5 mmol/L (ref 3.5–5.1)
Sodium: 135 mmol/L (ref 135–145)

## 2015-05-15 LAB — CBC WITH DIFFERENTIAL/PLATELET
BASOS ABS: 0 10*3/uL (ref 0.0–0.1)
Basophils Relative: 0 %
Eosinophils Absolute: 0 10*3/uL (ref 0.0–0.7)
Eosinophils Relative: 0 %
HEMATOCRIT: 37.1 % (ref 36.0–46.0)
HEMOGLOBIN: 12.3 g/dL (ref 12.0–15.0)
LYMPHS PCT: 12 %
Lymphs Abs: 1.6 10*3/uL (ref 0.7–4.0)
MCH: 29.2 pg (ref 26.0–34.0)
MCHC: 33.2 g/dL (ref 30.0–36.0)
MCV: 88.1 fL (ref 78.0–100.0)
MONO ABS: 0.8 10*3/uL (ref 0.1–1.0)
Monocytes Relative: 6 %
NEUTROS ABS: 11.1 10*3/uL — AB (ref 1.7–7.7)
NEUTROS PCT: 82 %
Platelets: 312 10*3/uL (ref 150–400)
RBC: 4.21 MIL/uL (ref 3.87–5.11)
RDW: 13.5 % (ref 11.5–15.5)
WBC: 13.5 10*3/uL — ABNORMAL HIGH (ref 4.0–10.5)

## 2015-05-15 LAB — MRSA PCR SCREENING: MRSA BY PCR: NEGATIVE

## 2015-05-15 LAB — PROTIME-INR
INR: 1.01 (ref 0.00–1.49)
Prothrombin Time: 13.1 seconds (ref 11.6–15.2)

## 2015-05-15 MED ORDER — SODIUM CHLORIDE 0.9 % IV BOLUS (SEPSIS)
500.0000 mL | Freq: Once | INTRAVENOUS | Status: AC
  Administered 2015-05-15: 500 mL via INTRAVENOUS

## 2015-05-15 MED ORDER — SENNOSIDES-DOCUSATE SODIUM 8.6-50 MG PO TABS: 1.0000 | ORAL_TABLET | Freq: Every evening | ORAL | Status: DC | PRN

## 2015-05-15 MED ORDER — IBUPROFEN 200 MG PO TABS: 400.0000 mg | ORAL_TABLET | Freq: Once | ORAL | Status: DC

## 2015-05-15 MED ORDER — SODIUM CHLORIDE 0.9% FLUSH
3.0000 mL | Freq: Two times a day (BID) | INTRAVENOUS | Status: DC
  Administered 2015-05-16 – 2015-05-18 (×4): 3 mL via INTRAVENOUS

## 2015-05-15 MED ORDER — ONDANSETRON HCL 4 MG/2ML IJ SOLN: 4.0000 mg | Freq: Four times a day (QID) | INTRAMUSCULAR | Status: DC | PRN

## 2015-05-15 MED ORDER — ACETAMINOPHEN 325 MG PO TABS
650.0000 mg | ORAL_TABLET | Freq: Four times a day (QID) | ORAL | Status: DC | PRN
  Administered 2015-05-15 (×2): 650 mg via ORAL
  Filled 2015-05-15 (×2): qty 2

## 2015-05-15 MED ORDER — SODIUM CHLORIDE 0.9 % IV BOLUS (SEPSIS): 500.0000 mL | Freq: Once | INTRAVENOUS | Status: DC

## 2015-05-15 MED ORDER — ONDANSETRON HCL 4 MG PO TABS
4.0000 mg | ORAL_TABLET | Freq: Four times a day (QID) | ORAL | Status: DC | PRN
  Administered 2015-05-15: 4 mg via ORAL
  Filled 2015-05-15: qty 1

## 2015-05-15 MED ORDER — ACETAMINOPHEN 650 MG RE SUPP: 650.0000 mg | Freq: Four times a day (QID) | RECTAL | Status: DC | PRN

## 2015-05-15 MED ORDER — OXYCODONE HCL 5 MG PO TABS
15.0000 mg | ORAL_TABLET | Freq: Four times a day (QID) | ORAL | Status: DC | PRN
  Administered 2015-05-15 – 2015-05-18 (×7): 15 mg via ORAL
  Filled 2015-05-15 (×8): qty 3

## 2015-05-15 MED ORDER — HYDROCHLOROTHIAZIDE 25 MG PO TABS
25.0000 mg | ORAL_TABLET | Freq: Every day | ORAL | Status: DC
  Administered 2015-05-15 – 2015-05-18 (×3): 25 mg via ORAL
  Filled 2015-05-15 (×4): qty 1

## 2015-05-15 MED ORDER — AMITRIPTYLINE HCL 50 MG PO TABS
25.0000 mg | ORAL_TABLET | Freq: Every evening | ORAL | Status: DC | PRN
Start: 2015-05-15 — End: 2015-05-18
  Administered 2015-05-15: 25 mg via ORAL
  Filled 2015-05-15: qty 1

## 2015-05-15 MED ORDER — SODIUM CHLORIDE 0.9 % IV SOLN
INTRAVENOUS | Status: AC
  Administered 2015-05-15: 11:00:00 via INTRAVENOUS

## 2015-05-15 NOTE — H&P (Signed)
History and Physical  Patient Name: Victoria Ball     ZOX:[id number]    DOB: 12-Apr-1926    DOA: 05/15/2015 Referring physician: Azalia Bilis, MD PCP: Roxy Manns, MD      Chief Complaint: Fall and head trauma  HPI: Victoria Ball is a 82 y.o. female with a past medical history significant for HTN and OA on chronic oxycodone who presents with fall.  The patient was in her usual state of health until tonight around 4AM when she got up to get a pain pill, and shortly after rolled off the couch (where she normally sleeps) striking her face on the floor.  She had face pain and so she came to the ER.  At this time, she describes right hip pain (chronic) as her chief complaint, resolving frontal HA, and nausea, without vomiting, confusion, focal weakness, seizure, vision changes.  She does not take aspirin, other antiplatelet, warfarin, or other AC.  In the ED, she was hemodynamically stable.  A CT of the head showed a left occipital SDH.  The case was discussed with Neurosurgery Dr. Lovell Sheehan who requested admission to [facility] for evaluation.       Review of Systems:  All other systems negative except as just noted or noted in the history of present illness.  No Known Allergies  Prior to Admission medications   Medication Sig Start Date End Date Taking? Authorizing Provider  amitriptyline (ELAVIL) 25 MG tablet Take 1  tablet by mouth at bedtime Patient taking differently: Take 25 mg by mouth at bedtime as needed for sleep.  12/20/14  Yes Judy Pimple, MD  cholecalciferol (VITAMIN D) 1000 UNITS tablet Take 1,000 Units by mouth daily.     Yes Historical Provider, MD  hydrochlorothiazide (HYDRODIURIL) 25 MG tablet Take 1 tablet (25 mg total) by mouth daily. 12/20/14  Yes Judy Pimple, MD  oxyCODONE (ROXICODONE) 15 MG immediate release tablet Take 15 mg by mouth every 6 (six) hours as needed for pain.  03/01/15  Yes Historical Provider, MD    Past Medical History  Diagnosis Date  . OP  (osteoporosis)     stopped Evista after 5 years  . HLD (hyperlipidemia)   . History of central retinal vein occlusion   . Hypertension   . Arthritis     Past Surgical History  Procedure Laterality Date  . Abdominal hysterectomy  1994    fibroid tumor    Family history: family history includes Angelman syndrome in her paternal grandfather; Coronary artery disease in her brother, brother, brother, and father; Diabetes in her father; Heart attack (age of onset: 20) in her sister.  No family history of bleeding.  Social History: Patient lives alone.  She does not smoke.  She uses a walker at baseline occasionally, usually without.  She does not drive.       Physical Exam: BP 150/80 mmHg  Pulse 109  Temp(Src) 97.8 F (36.6 C) (Oral)  Resp 16  SpO2 98%  LMP 04/14/1977 General appearance: Frail appearing elderly  female, awake but resting with her eyes closed and in no acute distress.   Eyes: Anicteric, conjunctiva pink, lids and lashes normal.  Pupils 2mm but equally reactive.   ENT: No nasal deformity, discharge, or epistaxis.  OP moist without lesions.   Lymph: No cervical or supraclavicular lymphadenopathy. Skin: Warm and dry.   Cardiac: Tachycardic, regular, nl S1-S2, no murmurs appreciated.  Capillary refill is brisk.  JVP normal.  No LE edema.  Radial  pulses 2+ and symmetric. Respiratory: Normal respiratory rate and rhythm.  CTAB without rales or wheezes. Abdomen: Abdomen soft without rigidity.  No TTP. No ascites, distension.   MSK: No deformities or effusions. Neuro: Cranial nerves 3-12 equal.  Upper and lower extremities globally weak but symmetric.  Sensorium intact and responding to questions, attention normal.  Speech is fluent.  Moves with normal bt slow coordination.  Psych: Behavior appropriate.  Affect blunted.  No evidence of aural or visual hallucinations or delusions.       Labs on Admission:  The metabolic panel shows normal electrolytes and renal  function. The complete blood count shows leukocytosis without anemia or thrombocytopenia. INR normal.   Radiological Exams on Admission: Personally reviewed: Ct Head Wo Contrast 05/15/2015   IMPRESSION: 1. Acute subdural hematoma around the left cerebral convexity with 14 mm maximal thickness at the left occipital convexity. 2. Thin right parietal subdural hematoma. Electronically Signed   By: Marnee Spring M.D.   On: 05/15/2015 05:35       Assessment/Plan 1. SDH:  Not on blood thinners.  Exam normal at present.  Patient to be transferred to SDU at [facility] today.  ER team to monitor closely until transfer. -Consult to Neurosurgery, appreciate cares -Admit to Stepdown -Neuro checks q2hrs -Ondansetron for nausea    2. HTN:  Slightly hypertensive -Continue home HCTZ  3. Chronic OA pain:  -Continue home oxycodone  4. Tachycardia and leukocytosis: Infection doubted.  Presumably reaction to SDH. -Will obtain ECG to evaluate tachycardia and QTc and administer fluid bolus.        DVT PPx: SCDs Diet: NPO except for sips, advance diet per Neurosurgery Consultants: Neurosurgery Code Status: DO NOT RESUSCITATE Family Communication: None present  Medical decision making: What exists of the patient's previous chart was reviewed in depth and the case was discussed with Dr. Patria Mane. Patient seen 7:00 AM on 05/15/2015.  Disposition Plan:  I recommend admission to Stepdown.  Clinical condition: stable but potential for deterioration.  Anticipate inpatient admission for 2-3 days, evaluation by NSG and disposition per their evaluation.      Alberteen Sam [facility] Pager [phone]

## 2015-05-15 NOTE — ED Notes (Signed)
Patient transported to CT 

## 2015-05-15 NOTE — ED Notes (Signed)
[facility] arrived at bedside.   

## 2015-05-15 NOTE — Progress Notes (Signed)
Patient arrived to 2C05 via [facility] from [place]. CHG bath completed. Patient c/o pain (chronic). Patient denies any other discomfort at this time.

## 2015-05-15 NOTE — ED Provider Notes (Signed)
Additional emergency department documentation, pending admission, transfer to [facility]   EKG Interpretation  Date/Time:  Tuesday May 15 2015 07:30:11 EST Ventricular Rate:  115 PR Interval:  152 QRS Duration: 86 QT Interval:  347 QTC Calculation: 480 R Axis:   22 Text Interpretation:  Sinus tachycardia Biatrial enlargement Minimal ST depression, lateral leads No old tracing to compare Confirmed by [facility]  MD, Jahlia Omura ([id number]) on 05/15/2015 7:49:15 AM      Evaluated by me at 0750- she complains of pain in head and right hip. She has recently been treated with oxycodone, for pain. She appears somewhat sedated at this time, but is alert and lucid.  Filed Vitals:   05/15/15 0700 05/15/15 0730  BP: 164/90 155/83  Pulse: 115 116  Temp:    Resp: 18 22    Patient remains stable for transfer.  Mancel Bale, MD 05/15/15 [phone]

## 2015-05-15 NOTE — ED Notes (Signed)
Patient has been taking her oxycodone for a Pain. Patient had 120 pills of oxycodone. EMS states that patient only had a few left consider they were just filled by Pharmacy. Patient was rollingg over and rolled onto the floor. Patient states she struck the right cheek. Patient had an episode of an headache after this

## 2015-05-15 NOTE — ED Provider Notes (Signed)
CSN: [id number]     Arrival date & time 05/15/15  0451 History   First MD Initiated Contact with Patient 05/15/15 [phone]     Chief Complaint  Patient presents with  . Fall     (Consider location/radiation/quality/duration/timing/severity/associated sxs/prior Treatment) The history is provided by the patient and the EMS personnel.     Victoria Ball is a 82 y.o. female with history of arthritis, hypertension, osteoporosis, hyperlipidemia and chronic pain and multiple joints, presents to emergency room after rolling off her couch tonight just prior to arrival. She states that she had pain at got one pain pill at 4am and then accidentally rolled off cough.  Patient struck her right cheek on the carpeted flooring. She denies loss of consciousness, neck pain, nausea, vomiting, visual distrubances. She complains of a headache across her forehead. She also complains of right hip and thigh pain which is chronic.  She denies being on blood thinners.  She is on 15 mg of oxycodone four times a day.  She denies taking more than her prescribed dose.  She denies CP, SOB, abdominal pain.  No other acute complaints.  Past Medical History  Diagnosis Date  . OP (osteoporosis)     stopped Evista after 5 years  . HLD (hyperlipidemia)   . History of central retinal vein occlusion   . Hypertension   . Arthritis    Past Surgical History  Procedure Laterality Date  . Abdominal hysterectomy  1994    fibroid tumor   Family History  Problem Relation Age of Onset  . Coronary artery disease Father   . Diabetes Father     ?   Marland Kitchen Heart attack Sister 71  . Coronary artery disease Brother     CABG  . Coronary artery disease Brother     CABG  . Coronary artery disease Brother     CABG  . Angelman syndrome Paternal Grandfather    Social History  Substance Use Topics  . Smoking status: Passive Smoke Exposure - Never Smoker  . Smokeless tobacco: Never Used  . Alcohol Use: No   OB History    No data  available     Review of Systems  Constitutional: Negative.   Eyes: Negative.   Respiratory: Negative.  Negative for chest tightness and shortness of breath.   Cardiovascular: Negative.  Negative for chest pain.  Gastrointestinal: Negative for abdominal pain and abdominal distention.  Endocrine: Negative.   Genitourinary: Negative.   Musculoskeletal: Positive for arthralgias. Negative for joint swelling, neck pain and neck stiffness.  Skin: Negative.   Allergic/Immunologic: Negative.   Neurological: Positive for headaches. Negative for dizziness, tremors, syncope, weakness and numbness.  Hematological: Negative.   Psychiatric/Behavioral: Negative.   All other systems reviewed and are negative.     Allergies  Review of patient's allergies indicates no known allergies.  Home Medications   Prior to Admission medications   Medication Sig Start Date End Date Taking? Authorizing Provider  amitriptyline (ELAVIL) 25 MG tablet Take 1  tablet by mouth at bedtime Patient taking differently: Take 25 mg by mouth at bedtime as needed for sleep.  12/20/14  Yes Judy Pimple, MD  cholecalciferol (VITAMIN D) 1000 UNITS tablet Take 1,000 Units by mouth daily.     Yes Historical Provider, MD  hydrochlorothiazide (HYDRODIURIL) 25 MG tablet Take 1 tablet (25 mg total) by mouth daily. 12/20/14  Yes Judy Pimple, MD  oxyCODONE (ROXICODONE) 15 MG immediate release tablet Take 15 mg by mouth  every 6 (six) hours as needed for pain.  03/01/15  Yes Historical Provider, MD   BP 152/78 mmHg  Pulse 101  Temp(Src) 97.8 F (36.6 C) (Oral)  Resp 18  SpO2 97%  LMP 04/14/1977 Physical Exam  Constitutional: She is oriented to person, place, and time. She appears well-developed and well-nourished. She is cooperative.  Non-toxic appearance. She does not have a sickly appearance. No distress.  Elderly female, NAD  HENT:  Head: Normocephalic and atraumatic. Head is without raccoon's eyes and without Battle's sign.    Right Ear: Tympanic membrane and external ear normal. No hemotympanum.  Left Ear: Tympanic membrane and external ear normal. No hemotympanum.  Nose: No mucosal edema or nasal deformity. Right sinus exhibits maxillary sinus tenderness. Right sinus exhibits no frontal sinus tenderness. Left sinus exhibits no maxillary sinus tenderness and no frontal sinus tenderness.  Mouth/Throat: Oropharynx is clear and moist. No oropharyngeal exudate.  Eyes: Conjunctivae, EOM and lids are normal. Pupils are equal, round, and reactive to light. Right eye exhibits no discharge. Left eye exhibits no discharge. No scleral icterus.  Neck: Trachea normal and normal range of motion. Neck supple. No JVD present. No spinous process tenderness and no muscular tenderness present. No rigidity. No tracheal deviation and normal range of motion present. No thyromegaly present.  Cardiovascular: Normal rate, regular rhythm, normal heart sounds and intact distal pulses.  Exam reveals no gallop and no friction rub.   No murmur heard. Pulmonary/Chest: Effort normal and breath sounds normal. No respiratory distress. She has no wheezes. She has no rales. She exhibits no tenderness.  Abdominal: Soft. Bowel sounds are normal. She exhibits no distension and no mass. There is no tenderness. There is no rebound and no guarding.  Musculoskeletal: Normal range of motion. She exhibits no edema.       Right hip: She exhibits tenderness. She exhibits normal range of motion, no bony tenderness, no swelling and no deformity.       Cervical back: Normal. She exhibits normal range of motion, no tenderness and no bony tenderness.       Legs: Lymphadenopathy:    She has no cervical adenopathy.  Neurological: She is alert and oriented to person, place, and time. She has normal strength. She is not disoriented. She displays no tremor. No cranial nerve deficit or sensory deficit. She exhibits normal muscle tone. She displays no seizure activity. GCS eye  subscore is 4. GCS verbal subscore is 5. GCS motor subscore is 6.  Gait not evaluated  Skin: Skin is warm and dry. No rash noted. She is not diaphoretic. No erythema. No pallor.  Psychiatric: She has a normal mood and affect. Her speech is normal and behavior is normal. Judgment and thought content normal.  Nursing note and vitals reviewed.   ED Course  Procedures (including critical care time) Labs Review Labs Reviewed  CBC WITH DIFFERENTIAL/PLATELET - Abnormal; Notable for the following:    WBC 13.5 (*)    Neutro Abs 11.1 (*)    All other components within normal limits  BASIC METABOLIC PANEL  PROTIME-INR    Imaging Review Ct Head Wo Contrast  05/15/2015  CLINICAL DATA:  Fall from couch with head injury. Initial encounter. EXAM: CT HEAD WITHOUT CONTRAST TECHNIQUE: Contiguous axial images were obtained from the base of the skull through the vertex without intravenous contrast. COMPARISON:  None. FINDINGS: Skull and Sinuses:Negative for fracture. Visualized orbits: Bilateral cataract resection. No traumatic finding Brain: High-density subdural collection around the majority of  the left cerebral hemisphere, up to 14 mm in the occipital region where there is mild to moderate cortical mass effect. No herniation; midline shift is minimal at the level of the septum pellucidum. There is a thin (2 mm) limited extent subdural hematoma in the lower right parietal region. No evidence of acute infarct. No hydrocephalus or brain swelling. Age congruent/mild small vessel ischemic change in the deep cerebral white matter. Critical Value/emergent results were called by telephone at the time of interpretation on 05/15/2015 at 5:31 am to Dr. Patria Mane, who verbally acknowledged these results. IMPRESSION: 1. Acute subdural hematoma around the left cerebral convexity with 14 mm maximal thickness at the left occipital convexity. 2. Thin right parietal subdural hematoma. Electronically Signed   By: Marnee Spring M.D.    On: 05/15/2015 05:35   I have personally reviewed and evaluated these images and lab results as part of my medical decision-making.   EKG Interpretation None      MDM   82 y.o. Female with fall and head trauma, impact to right cheek on carpeted flooring. Patient appears sleepy and has her eyes closed but is responding to all verbal commands and questions with clear speech, she is oriented 3.  She gives relatively cohesive history.  She complains of pain to her right cheek and severe headache across her forehead, as well as right hip and thigh pain which is chronic and unchanged.  She states she takes her pain medication as prescribed although there were concerns from EMS that there was very few pills in the bottle and it was recently prescribed, less than 2 weeks ago with a total number of pills equaling 120 of Oxycodone 15 mg.  The patient was looked up in [place] controlled substance database which show she has been prescribed the same dose and amount for several months. She is on the cardiac and pulse ox monitor with 97% oxygen saturation on room air, respirations per minute are normal.  She has a grossly normal neurological exam.  CT head without contrast was ordered to rule out bleed, basic labs and coags also ordered.  The patient's neck and cervical spine was nontender to palpation of the spinal processes, no step-off palpated, no paraspinal muscle tenderness.  Do not feel cervical spinal films are necessary given her exam.  Patient has tenderness to her right cheek without any abnormalities palpated of the right maxilla or right orbit. Patient has normal EOMs.  The patient was discussed with Dr. Patria Mane, who has personally seen and evaluated the patient.  He agrees with planned CT of head.  CT is significant for an acute subdural hematoma of the left cerebral convexity with 14 mm thickness and then a right parietal subdural hematoma.  Dr. Patria Mane consulted with neurosurgery who  will see the patient and [facility].  Dr. Patria Mane also contacted patient's daughter and discussed results and transferred to [facility].  At shift change, 6:09 AM pt was signed out to Dr. Patria Mane.  CRITICAL CARE Performed by: Danelle Berry Total critical care time: 40 Critical care time was exclusive of separately billable procedures and treating other patients. Critical care was necessary to treat or prevent imminent or life-threatening deterioration. Critical care was time spent personally by me on the following activities: development of treatment plan with patient and/or surrogate as well as nursing, discussions with consultants, evaluation of patient's response to treatment, examination of patient, obtaining history from patient or surrogate, ordering and performing treatments and interventions, ordering and review  of laboratory studies, ordering and review of radiographic studies, pulse oximetry and re-evaluation of patient's condition.   Final diagnoses:  Subdural hematoma (HCC)      Danelle Berry, PA-C 05/15/15 [id number]  Azalia Bilis, MD 05/15/15 [phone]

## 2015-05-15 NOTE — Consult Note (Signed)
Reason for Consult: Left subdural hematoma Referring Physician: Dr. Ayesha Rumpf is an 82 y.o. female.  HPI: The patient is an 82 year old white female who was in usual state of good health. She was sleeping on her couch last evening and rolled off onto a carpeted floor striking her head. She did not lose consciousness. She was seen at the [facility] Dr. Venora Maples and a head CT demonstrated a left subdural hematoma. A neurosurgical consultation was requested. The patient is not on  any anticoagulants. She admits to a headache. She denies neck pain back pain numbness, tingling, weakness. She has a chronic right hip pain which has not changed since her fall.  Past Medical History  Diagnosis Date  . OP (osteoporosis)     stopped Evista after 5 years  . HLD (hyperlipidemia)   . History of central retinal vein occlusion   . Hypertension   . Arthritis     Past Surgical History  Procedure Laterality Date  . Abdominal hysterectomy  1994    fibroid tumor    Family History  Problem Relation Age of Onset  . Coronary artery disease Father   . Diabetes Father     ?   Marland Kitchen Heart attack Sister 24  . Coronary artery disease Brother     CABG  . Coronary artery disease Brother     CABG  . Coronary artery disease Brother     CABG  . Angelman syndrome Paternal Grandfather     Social History:  reports that she has been passively smoking.  She has never used smokeless tobacco. She reports that she does not drink alcohol or use illicit drugs.  Allergies: No Known Allergies  Medications:  I have reviewed the patient's current medications. Prior to Admission:  (Not in a hospital admission) Scheduled:  Continuous:  MWU:[id number] (ZOFRAN) IV, oxyCODONE Anti-infectives    None       Results for orders placed or performed during the hospital encounter of 05/15/15 (from the past 48 hour(s))  CBC with Differential/Platelet     Status: Abnormal   Collection Time: 05/15/15  5:32  AM  Result Value Ref Range   WBC 13.5 (H) 4.0 - 10.5 K/uL   RBC 4.21 3.87 - 5.11 MIL/uL   Hemoglobin 12.3 12.0 - 15.0 g/dL   HCT 37.1 36.0 - 46.0 %   MCV 88.1 78.0 - 100.0 fL   MCH 29.2 26.0 - 34.0 pg   MCHC 33.2 30.0 - 36.0 g/dL   RDW 13.5 11.5 - 15.5 %   Platelets 312 150 - 400 K/uL   Neutrophils Relative % 82 %   Neutro Abs 11.1 (H) 1.7 - 7.7 K/uL   Lymphocytes Relative 12 %   Lymphs Abs 1.6 0.7 - 4.0 K/uL   Monocytes Relative 6 %   Monocytes Absolute 0.8 0.1 - 1.0 K/uL   Eosinophils Relative 0 %   Eosinophils Absolute 0.0 0.0 - 0.7 K/uL   Basophils Relative 0 %   Basophils Absolute 0.0 0.0 - 0.1 K/uL  Basic metabolic panel     Status: Abnormal   Collection Time: 05/15/15  5:32 AM  Result Value Ref Range   Sodium 135 135 - 145 mmol/L   Potassium 3.5 3.5 - 5.1 mmol/L   Chloride 97 (L) 101 - 111 mmol/L   CO2 29 22 - 32 mmol/L   Glucose, Bld 126 (H) 65 - 99 mg/dL   BUN 18 6 - 20 mg/dL   Creatinine, Ser  0.94 0.44 - 1.00 mg/dL   Calcium 8.9 8.9 - 10.3 mg/dL   GFR calc non Af Amer 53 (L) >60 mL/min   GFR calc Af Amer >60 >60 mL/min    Comment: (NOTE) The eGFR has been calculated using the CKD EPI equation. This calculation has not been validated in all clinical situations. eGFR's persistently <60 mL/min signify possible Chronic Kidney Disease.    Anion gap 9 5 - 15  Protime-INR     Status: None   Collection Time: 05/15/15  5:32 AM  Result Value Ref Range   Prothrombin Time 13.1 11.6 - 15.2 seconds   INR 1.01 0.00 - 1.49    Ct Head Wo Contrast  05/15/2015  CLINICAL DATA:  Fall from couch with head injury. Initial encounter. EXAM: CT HEAD WITHOUT CONTRAST TECHNIQUE: Contiguous axial images were obtained from the base of the skull through the vertex without intravenous contrast. COMPARISON:  None. FINDINGS: Skull and Sinuses:Negative for fracture. Visualized orbits: Bilateral cataract resection. No traumatic finding Brain: High-density subdural collection around the  majority of the left cerebral hemisphere, up to 14 mm in the occipital region where there is mild to moderate cortical mass effect. No herniation; midline shift is minimal at the level of the septum pellucidum. There is a thin (2 mm) limited extent subdural hematoma in the lower right parietal region. No evidence of acute infarct. No hydrocephalus or brain swelling. Age congruent/mild small vessel ischemic change in the deep cerebral white matter. Critical Value/emergent results were called by telephone at the time of interpretation on 05/15/2015 at 5:31 am to Dr. Venora Maples, who verbally acknowledged these results. IMPRESSION: 1. Acute subdural hematoma around the left cerebral convexity with 14 mm maximal thickness at the left occipital convexity. 2. Thin right parietal subdural hematoma. Electronically Signed   By: Monte Fantasia M.D.   On: 05/15/2015 05:35    ROS: As above Blood pressure 155/83, pulse 116, temperature 97.8 F (36.6 C), temperature source Oral, resp. rate 22, last menstrual period 04/14/1977, SpO2 98 %. Physical Exam   General: An alert and pleasant 82 year old white female in no apparent distress  HEENT: Normocephalic, atraumatic, pupils equal round reactive light, extraocular muscles intact, I don't see any obvious trauma.   Neck: Supple without masses or deformities. She has age-appropriate limited range of motion.  Thorax: Symmetric  Abdomen: Soft  Extremities: Unremarkable  Neurologic exam: The patient is alert and oriented 3. Glasgow Coma Scale 15. Cranial nerves II through XII are examined bilaterally grossly normal. The patient's motor strength is 5 over 5 in her bilateral , biceps, triceps, handgrip, quadriceps, gastrocnemius. Cerebellar function is intact to rapid alternating movements of the upper extremities bilaterally. Sensory function is intact to light touch sensation all tested dermatomes bilaterally.  I have reviewed the patient's head CT performed [facility] today. It demonstrates a left acute subdural hematoma with mild midline shift.  Assessment/Plan: Left subdural hematoma: I have discussed situation with the patient. She is doing well neurologically. She will be transferred to [facility] for further observation. Plan to repeat her CAT scan tomorrow. If the CAT scan does not demonstrate any significant enlargement of the subdural, she can be discharged tomorrow and follow-up with me in the office.  Elmyra Banwart D 05/15/2015, 7:53 AM

## 2015-05-15 NOTE — ED Notes (Signed)
Bed: UJ81 Expected date:  Expected time:  Means of arrival:  Comments: EMS 82 yo rolled off couch / cheek pain

## 2015-05-15 NOTE — Progress Notes (Signed)
Patient admitted after midnight. Please refer to admission note done 05/15/2015.  Patient is a 82 year old female who fell at home. She subsequently developed left subdural hematoma. Neurosurgical consultation was requested. Recommendation is to repeat CAT scan and if no significant enlargement and most likely patient can be discharged 05/16/2015 with follow-up with neurosurgery on outpatient basis.  Plan is meanwhile to transfer the patient to [facility] in case subdural hematoma worse.  Awaiting bed in step down unit.  Manson Passey [facility] [phone]

## 2015-05-16 ENCOUNTER — Inpatient Hospital Stay (HOSPITAL_COMMUNITY): Payer: Medicare HMO

## 2015-05-16 DIAGNOSIS — R569 Unspecified convulsions: Secondary | ICD-10-CM

## 2015-05-16 DIAGNOSIS — R400 Somnolence: Secondary | ICD-10-CM

## 2015-05-16 DIAGNOSIS — R4701 Aphasia: Secondary | ICD-10-CM

## 2015-05-16 LAB — BASIC METABOLIC PANEL
Anion gap: 8 (ref 5–15)
BUN: 10 mg/dL (ref 6–20)
CHLORIDE: 98 mmol/L — AB (ref 101–111)
CO2: 29 mmol/L (ref 22–32)
CREATININE: 0.99 mg/dL (ref 0.44–1.00)
Calcium: 8.6 mg/dL — ABNORMAL LOW (ref 8.9–10.3)
GFR calc Af Amer: 58 mL/min — ABNORMAL LOW (ref >60)
GFR, EST NON AFRICAN AMERICAN: 50 mL/min — AB (ref >60)
GLUCOSE: 91 mg/dL (ref 65–99)
POTASSIUM: 3.7 mmol/L (ref 3.5–5.1)
Sodium: 135 mmol/L (ref 135–145)

## 2015-05-16 LAB — CBC
HEMATOCRIT: 37 % (ref 36.0–46.0)
Hemoglobin: 12.6 g/dL (ref 12.0–15.0)
MCH: 30.1 pg (ref 26.0–34.0)
MCHC: 34.1 g/dL (ref 30.0–36.0)
MCV: 88.3 fL (ref 78.0–100.0)
Platelets: 321 10*3/uL (ref 150–400)
RBC: 4.19 MIL/uL (ref 3.87–5.11)
RDW: 13.4 % (ref 11.5–15.5)
WBC: 10.5 10*3/uL (ref 4.0–10.5)

## 2015-05-16 MED ORDER — ACETAMINOPHEN 325 MG PO TABS: 325.0000 mg | ORAL_TABLET | Freq: Four times a day (QID) | ORAL | Status: DC | PRN

## 2015-05-16 MED ORDER — AMITRIPTYLINE HCL 25 MG PO TABS: 25.0000 mg | ORAL_TABLET | Freq: Every evening | ORAL | Status: DC | PRN

## 2015-05-16 MED ORDER — LEVETIRACETAM 500 MG PO TABS
500.0000 mg | ORAL_TABLET | Freq: Two times a day (BID) | ORAL | Status: DC
  Administered 2015-05-16: 500 mg via ORAL
  Filled 2015-05-16: qty 1

## 2015-05-16 NOTE — Progress Notes (Signed)
Neurosurgery DR Tressie Stalker came by to see pt. & examined her

## 2015-05-16 NOTE — Progress Notes (Signed)
Going over d/c info and all of a sudden pt is not able to answer orientation questions- also has some expressive aphasia and made up words. - getting frustrated  Text page to attending DR. Next call to neuro. PERL Moving all 4 extremities   C hange from last neuro check. Will not let me put EKG monitor back on becoming agitated dressed in robe and wants to go home now.

## 2015-05-16 NOTE — Progress Notes (Signed)
Had tried to call daughter x 4 today named [name] with the phone number listed. 1 daughter has visited earlier briefly & did not leave her phone number. First few phone calls were for pt being discharged and to inquire about a ride and the last attempt was to  let daughter [name] know that there was a change in her mothers condition and that the discharge had been cancelled. Left messages for her to call [facility] - no return calls

## 2015-05-16 NOTE — Progress Notes (Signed)
Initial assessment at 1935 pt is unable to answer questions appropriately, cannot state name/DOB/location etc... Pt does follow commands and goes right back to sleep when left alone.  RN will continue to monitor.

## 2015-05-16 NOTE — Discharge Summary (Signed)
DISCHARGE SUMMARY  Victoria Ball  MR#: [id number]  DOB:Aug 13, 1925  Date of Admission: 05/15/2015 Date of Discharge: 05/16/2015  Attending Physician:Yvaine Jankowiak T  Patient's WUJ:WJXBJ Tower, MD  Consults: Neurosurgery  Disposition: D/C home   Follow-up Appts:     Follow-up Information    Follow up with Cristi Loron, MD. Schedule an appointment as soon as possible for a visit in 2 weeks.   Specialty:  Neurosurgery   Contact information:   [address] [place] [address] [phone]      Discharge Diagnoses: SDH HTN Chronic OA pain  Initial presentation: 82 y.o. female with a history significant of HTN and OA on chronic oxycodone who presented after a fall.  The patient got up to get a pain pill, and shortly after rolled off the couch (where she normally sleeps) striking her face on the floor. She had pain and came to the ER. She does not take aspirin, warfarin, or other AC.  In the ED a CT of the head showed a left occipital SDH. The case was discussed with Neurosurgery who requested admission to [facility] for observation.   Hospital Course:  L SDH  Pt not on any blood thinners - f/u CT head 2/1 noted slight regression of L SDH - clinically pt remained stable - she does not live alone - she was cleared for d/c home by NS - no other medical issues were present to require prolonged hospital stay   HTN BP reasonably controlled accounting for pain - no change in tx regimen at time of d/c   Chronic OA pain Continue home oxycodone w/o change     Medication List    TAKE these medications        acetaminophen 325 MG tablet  Commonly known as:  TYLENOL  Take 1-2 tablets (325-650 mg total) by mouth every 6 (six) hours as needed for mild pain or headache.     amitriptyline 25 MG tablet  Commonly known as:  ELAVIL  Take 1 tablet (25 mg total) by mouth at bedtime as needed for sleep.     cholecalciferol 1000 units tablet  Commonly  known as:  VITAMIN D  Take 1,000 Units by mouth daily.     hydrochlorothiazide 25 MG tablet  Commonly known as:  HYDRODIURIL  Take 1 tablet (25 mg total) by mouth daily.     oxyCODONE 15 MG immediate release tablet  Commonly known as:  ROXICODONE  Take 15 mg by mouth every 6 (six) hours as needed for pain.        Day of Discharge BP 112/65 mmHg  Pulse 99  Temp(Src) 99.4 F (37.4 C) (Oral)  Resp 25  Ht  (1.651 m)  Wt 48.6 kg (107 lb 2.3 oz)  BMI 17.83 kg/m2  SpO2 96%  LMP 04/14/1977  Physical Exam: General: No acute respiratory distress - alert and conversant  Lungs: Clear to auscultation bilaterally without wheezes or crackles Cardiovascular: Regular rate and rhythm without murmur gallop or rub normal S1 and S2 Abdomen: Nontender, nondistended, soft, bowel sounds positive, no rebound, no ascites, no appreciable mass Extremities: No significant cyanosis, clubbing, or edema bilateral lower extremities  Basic Metabolic Panel:  Recent Labs Lab 05/15/15 0532 05/16/15 0330  NA 135 135  K 3.5 3.7  CL 97* 98*  CO2 29 29  GLUCOSE 126* 91  BUN 18 10  CREATININE 0.94 0.99  CALCIUM 8.9 8.6*   Coags:  Recent Labs Lab 05/15/15 0532  INR  1.01   CBC:  Recent Labs Lab 05/15/15 0532 05/16/15 0330  WBC 13.5* 10.5  NEUTROABS 11.1*  --   HGB 12.3 12.6  HCT 37.1 37.0  MCV 88.1 88.3  PLT 312 321    Recent Results (from the past 240 hour(s))  MRSA PCR Screening     Status: None   Collection Time: 05/15/15  1:18 PM  Result Value Ref Range Status   MRSA by PCR NEGATIVE NEGATIVE Final    Comment:        The GeneXpert MRSA Assay (FDA approved for NASAL specimens only), is one component of a comprehensive MRSA colonization surveillance program. It is not intended to diagnose MRSA infection nor to guide or monitor treatment for MRSA infections.      Time spent in discharge (includes decision making & examination of pt): <30 minutes  05/16/2015, 3:14 PM     Lonia Blood, MD [facility] Office  [phone] Pager [phone]  On-Call/Text Page:      [email or web address]      password [facility]

## 2015-05-16 NOTE — Progress Notes (Signed)
Utilization review completed. Natacha Jepsen, RN, BSN. 

## 2015-05-16 NOTE — Progress Notes (Signed)
Went over all d/c info & instructions with pt. Now getting dressed for discharge says daughter will be here anytime. IV d/c'd and ready

## 2015-05-16 NOTE — Progress Notes (Signed)
Call to neuro Dr Tressie Stalker - relayed about pt's change in condition - wants stat CT of head  DR Sharon Seller here - ordered Stat CT of head and cancel discharge

## 2015-05-16 NOTE — Progress Notes (Signed)
Subjective:  The patient has done well this afternoon but suddenly became aphasic. A follow-up head CT demonstrates no change in her left-sided subdural hematoma. Presently the patient is alert and pleasant. She is in no apparent distress.  Objective: Vital signs in last 24 hours: Temp:  [98.3 F (36.8 C)-99.5 F (37.5 C)] 99.4 F (37.4 C) (02/01 1247) Pulse Rate:  [36-111] 99 (02/01 1247) Resp:  [17-25] 25 (02/01 1247) BP: (112-156)/(65-84) 112/65 mmHg (02/01 1247) SpO2:  [95 %-97 %] 96 % (02/01 1247)  Intake/Output from previous day: 01/31 0701 - 02/01 0700 In: 1150 [I.V.:1150] Out: 1050 [Urine:1050] Intake/Output this shift: Total I/O In: 720 [P.O.:720] Out: 250 [Urine:250]  Physical exam the patient is alert and pleasant. Her strength is normal in all 4 extremities. Her pupils are equal. She has an expressive dysphasia.  I have reviewed the patient's repeat head CT performed this evening. There is no change in the left subdural hematoma. There is no significant mass effect or midline shift.  Lab Results:  Recent Labs  05/15/15 0532 05/16/15 0330  WBC 13.5* 10.5  HGB 12.3 12.6  HCT 37.1 37.0  PLT 312 321   BMET  Recent Labs  05/15/15 0532 05/16/15 0330  NA 135 135  K 3.5 3.7  CL 97* 98*  CO2 29 29  GLUCOSE 126* 91  BUN 18 10  CREATININE 0.94 0.99  CALCIUM 8.9 8.6*    Studies/Results: Ct Head Wo Contrast  05/16/2015  CLINICAL DATA:  Subdural hematoma. Sudden onset of confusion and a aphasia. Headache. EXAM: CT HEAD WITHOUT CONTRAST TECHNIQUE: Contiguous axial images were obtained from the base of the skull through the vertex without intravenous contrast. COMPARISON:  CT head without contrast at 7:20 a.m., the same day. FINDINGS: There is no significant interval change and a hyperdense subdural collection on the left. The largest component is posteriorly on the parietal lobe measuring up to 10 mm. It is hyperdense blood layering on the tentorium and along the  anterior falx. There is effacement of sulci in the left parietal and occipital lobe without significant interval change. Minimal subdural blood on the right is stable. No new hemorrhage is present. No acute infarct is evident. There is no significant midline shift. The posterior fossa is unremarkable. The paranasal sinuses and mastoid air cells are clear. The calvarium is intact. IMPRESSION: 1. Stable appearance of diffuse left-sided hyperdense subdural hematoma. 2. Trace right subdural blood is stable. 3. No new hemorrhage. 4. No significant interval change. Electronically Signed   By: Marin Roberts M.D.   On: 05/16/2015 18:36   Ct Head Wo Contrast  05/16/2015  CLINICAL DATA:  82 year old female with left greater than right subdural hematoma after a fall. Initial encounter. EXAM: CT HEAD WITHOUT CONTRAST TECHNIQUE: Contiguous axial images were obtained from the base of the skull through the vertex without intravenous contrast. COMPARISON:  05/15/2015. FINDINGS: Visible paranasal sinuses and mastoids remain clear. Stable visualized osseous structures. Negative orbit and scalp soft tissues. Hyperdense left greater than right small subdural hematomas persist. Mild regression of the broad-based left side subdural, measuring 11 mm or less in thickness now (previously 14 mm). Small volume of blood again layering along the falx and left tentorium. Trace hyperdense right subdural hematoma has not significantly changed and is most apparent on series 21, image 15. No midline shift. No ventriculomegaly. Basilar cisterns remain patent. There is a small volume of subdural hematoma in the left posterior fossa. Gray-white matter differentiation remains normal. No cortically based  acute infarct identified. IMPRESSION: 1. Slight regression of the broad-based left subdural hematoma which is present throughout the supratentorial and infratentorial compartments. 2. Trace right subdural hematoma appears stable. 3. No  significant intracranial mass effect. No new intracranial abnormality. Electronically Signed   By: Odessa Fleming M.D.   On: 05/16/2015 07:42   Ct Head Wo Contrast  05/15/2015  CLINICAL DATA:  Fall from couch with head injury. Initial encounter. EXAM: CT HEAD WITHOUT CONTRAST TECHNIQUE: Contiguous axial images were obtained from the base of the skull through the vertex without intravenous contrast. COMPARISON:  None. FINDINGS: Skull and Sinuses:Negative for fracture. Visualized orbits: Bilateral cataract resection. No traumatic finding Brain: High-density subdural collection around the majority of the left cerebral hemisphere, up to 14 mm in the occipital region where there is mild to moderate cortical mass effect. No herniation; midline shift is minimal at the level of the septum pellucidum. There is a thin (2 mm) limited extent subdural hematoma in the lower right parietal region. No evidence of acute infarct. No hydrocephalus or brain swelling. Age congruent/mild small vessel ischemic change in the deep cerebral white matter. Critical Value/emergent results were called by telephone at the time of interpretation on 05/15/2015 at 5:31 am to Dr. Patria Mane, who verbally acknowledged these results. IMPRESSION: 1. Acute subdural hematoma around the left cerebral convexity with 14 mm maximal thickness at the left occipital convexity. 2. Thin right parietal subdural hematoma. Electronically Signed   By: Marnee Spring M.D.   On: 05/15/2015 05:35    Assessment/Plan: Dysphasia left subdural hematoma: The patient's head scan is without change. Her sodium is normal. A possible explanation is a seizure. She has been started on Keppra. She is not currently seizing. We will continue observation. Calls have been put into the patient's daughter unsuccessfully.  LOS: 1 day     Zygmund Passero D 05/16/2015, 6:48 PM

## 2015-05-16 NOTE — Progress Notes (Signed)
  [place] TEAM 1 - Stepdown/ICU TEAM  I was called to bedside by the patient's nurse who reported that the patient had been normal throughout the afternoon, but suffered an acute change appreciated by the RN when she was going over the discharge paperwork.  The RN noted that the patient was suddenly displaying word finding difficulty and appeared to be somewhat confused.  I presented to the room to evaluate the patient.  She was standing in the doorway about to leave the room.  She was walking without difficulty.  I coaxed her back in the room and helped her sit down.  She was alert and conversant but did in fact display significant expressive aphasia.  She otherwise displayed no focal neurologic deficits with full strength in bilateral upper and lower extremities.  Her pupils were equal and round and reactive.  She denied having any deficits and was very adamant that she wanted to go home.  When pushed her further with specific questions she appeared to become aware that she was unable to make herself speak the answers.  I have ordered a stat CT head to follow-up her known subdural hemorrhage.  Neurosurgery has been alerted by the nurse.  Her discharge has obviously been canceled.  Lonia Blood, MD [facility] Office  [phone] Pager - Text Page per Amion as per below:  On-Call/Text Page:      [email or web address]      password [email or web address]  If 7PM-7AM, please contact night-coverage [email or web address] Password [facility] 05/16/2015, 6:05 PM

## 2015-05-16 NOTE — Progress Notes (Signed)
Patient ID: Victoria Ball, female   DOB: 1926/01/12, 82 y.o.   MRN: [id number] Subjective:  The patient is alert and pleasant. She says her headache has improved.  Objective: Vital signs in last 24 hours: Temp:  [98.2 F (36.8 C)-99.5 F (37.5 C)] 99.4 F (37.4 C) (02/01 1247) Pulse Rate:  [36-111] 99 (02/01 1247) Resp:  [12-25] 25 (02/01 1247) BP: (112-156)/(65-84) 112/65 mmHg (02/01 1247) SpO2:  [95 %-97 %] 96 % (02/01 1247) Weight:  [48.6 kg (107 lb 2.3 oz)] 48.6 kg (107 lb 2.3 oz) (01/31 1529)  Intake/Output from previous day: 01/31 0701 - 02/01 0700 In: 1150 [I.V.:1150] Out: 1050 [Urine:1050] Intake/Output this shift: Total I/O In: 480 [P.O.:480] Out: 250 [Urine:250]  Physical exam the patient is alert and oriented 3. She is moving all 4 extremities well. Speech is normal.  I reviewed the patient's follow-up head CT performed today. It demonstrates that her left subdural hematoma has not changed significantly. There is mild mass effect.  Lab Results:  Recent Labs  05/15/15 0532 05/16/15 0330  WBC 13.5* 10.5  HGB 12.3 12.6  HCT 37.1 37.0  PLT 312 321   BMET  Recent Labs  05/15/15 0532 05/16/15 0330  NA 135 135  K 3.5 3.7  CL 97* 98*  CO2 29 29  GLUCOSE 126* 91  BUN 18 10  CREATININE 0.94 0.99  CALCIUM 8.9 8.6*    Studies/Results: Ct Head Wo Contrast  05/16/2015  CLINICAL DATA:  82 year old female with left greater than right subdural hematoma after a fall. Initial encounter. EXAM: CT HEAD WITHOUT CONTRAST TECHNIQUE: Contiguous axial images were obtained from the base of the skull through the vertex without intravenous contrast. COMPARISON:  05/15/2015. FINDINGS: Visible paranasal sinuses and mastoids remain clear. Stable visualized osseous structures. Negative orbit and scalp soft tissues. Hyperdense left greater than right small subdural hematomas persist. Mild regression of the broad-based left side subdural, measuring 11 mm or less in thickness now  (previously 14 mm). Small volume of blood again layering along the falx and left tentorium. Trace hyperdense right subdural hematoma has not significantly changed and is most apparent on series 21, image 15. No midline shift. No ventriculomegaly. Basilar cisterns remain patent. There is a small volume of subdural hematoma in the left posterior fossa. Gray-white matter differentiation remains normal. No cortically based acute infarct identified. IMPRESSION: 1. Slight regression of the broad-based left subdural hematoma which is present throughout the supratentorial and infratentorial compartments. 2. Trace right subdural hematoma appears stable. 3. No significant intracranial mass effect. No new intracranial abnormality. Electronically Signed   By: Odessa Fleming M.D.   On: 05/16/2015 07:42   Ct Head Wo Contrast  05/15/2015  CLINICAL DATA:  Fall from couch with head injury. Initial encounter. EXAM: CT HEAD WITHOUT CONTRAST TECHNIQUE: Contiguous axial images were obtained from the base of the skull through the vertex without intravenous contrast. COMPARISON:  None. FINDINGS: Skull and Sinuses:Negative for fracture. Visualized orbits: Bilateral cataract resection. No traumatic finding Brain: High-density subdural collection around the majority of the left cerebral hemisphere, up to 14 mm in the occipital region where there is mild to moderate cortical mass effect. No herniation; midline shift is minimal at the level of the septum pellucidum. There is a thin (2 mm) limited extent subdural hematoma in the lower right parietal region. No evidence of acute infarct. No hydrocephalus or brain swelling. Age congruent/mild small vessel ischemic change in the deep cerebral white matter. Critical Value/emergent results were called by  telephone at the time of interpretation on 05/15/2015 at 5:31 am to Dr. Patria Mane, who verbally acknowledged these results. IMPRESSION: 1. Acute subdural hematoma around the left cerebral convexity with 14  mm maximal thickness at the left occipital convexity. 2. Thin right parietal subdural hematoma. Electronically Signed   By: Marnee Spring M.D.   On: 05/15/2015 05:35    Assessment/Plan: Left subdural hematoma: The patient is clinically improving and stable radiologically. I think it's okay for her to be discharged to home, she lives with her daughter. Please have her follow-up with me in the office in about 2 weeks for follow-up head CT. Please call if I can be of further assistance.  LOS: 1 day     Sherrika Weakland D 05/16/2015, 1:32 PM

## 2015-05-17 ENCOUNTER — Inpatient Hospital Stay (HOSPITAL_COMMUNITY): Payer: Medicare HMO

## 2015-05-17 DIAGNOSIS — I6200 Nontraumatic subdural hemorrhage, unspecified: Secondary | ICD-10-CM

## 2015-05-17 DIAGNOSIS — L8990 Pressure ulcer of unspecified site, unspecified stage: Secondary | ICD-10-CM

## 2015-05-17 DIAGNOSIS — R400 Somnolence: Secondary | ICD-10-CM | POA: Diagnosis present

## 2015-05-17 DIAGNOSIS — R569 Unspecified convulsions: Secondary | ICD-10-CM | POA: Diagnosis present

## 2015-05-17 LAB — GLUCOSE, CAPILLARY: Glucose-Capillary: 72 mg/dL (ref 65–99)

## 2015-05-17 MED ORDER — HYDRALAZINE HCL 20 MG/ML IJ SOLN: 5.0000 mg | INTRAMUSCULAR | Status: DC | PRN

## 2015-05-17 MED ORDER — LORAZEPAM 2 MG/ML IJ SOLN
INTRAMUSCULAR | Status: AC
  Administered 2015-05-17: 2 mg
  Filled 2015-05-17: qty 1

## 2015-05-17 MED ORDER — SODIUM CHLORIDE 0.9 % IV SOLN
500.0000 mg | Freq: Two times a day (BID) | INTRAVENOUS | Status: DC
  Administered 2015-05-17 – 2015-05-18 (×2): 500 mg via INTRAVENOUS
  Filled 2015-05-17 (×3): qty 5

## 2015-05-17 MED ORDER — LORAZEPAM 2 MG/ML IJ SOLN
1.0000 mg | INTRAMUSCULAR | Status: AC
  Administered 2015-05-17: 1 mg via INTRAVENOUS
  Filled 2015-05-17: qty 1

## 2015-05-17 MED ORDER — MIDAZOLAM HCL 2 MG/2ML IJ SOLN
2.0000 mg | Freq: Once | INTRAMUSCULAR | Status: AC
  Administered 2015-05-17: 2 mg via INTRAVENOUS
  Filled 2015-05-17: qty 2

## 2015-05-17 MED ORDER — LORAZEPAM 2 MG/ML IJ SOLN
INTRAMUSCULAR | Status: AC
  Filled 2015-05-17: qty 1

## 2015-05-17 MED ORDER — SODIUM CHLORIDE 0.9 % IV SOLN
INTRAVENOUS | Status: DC
  Administered 2015-05-17: 21:00:00 via INTRAVENOUS

## 2015-05-17 NOTE — Progress Notes (Signed)
Bedside EEG completed, results pending. 

## 2015-05-17 NOTE — Progress Notes (Signed)
Patient ID: Victoria Ball, female   DOB: Jul 02, 1925, 82 y.o.   MRN: [id number] Subjective:  The patient is somnolent but easily arousable. She is in no apparent distress. She is currently getting an EEG.  Objective: Vital signs in last 24 hours: Temp:  [98.3 F (36.8 C)-99.5 F (37.5 C)] 99.5 F (37.5 C) (02/02 0737) Pulse Rate:  [99-117] 114 (02/02 0400) Resp:  [16-25] 19 (02/02 0737) BP: (112-165)/(49-107) 165/86 mmHg (02/02 0737) SpO2:  [95 %-99 %] 98 % (02/02 0800)  Intake/Output from previous day: 02/01 0701 - 02/02 0700 In: 840 [P.O.:840] Out: 450 [Urine:450] Intake/Output this shift:    Physical exam last coma scale 11,  [id number]. She is arousable to voice. She tracks. She will answer some questions with yes or no. She moves all 4 extremities well. Her pupils are equal.  Lab Results:  Recent Labs  05/15/15 0532 05/16/15 0330  WBC 13.5* 10.5  HGB 12.3 12.6  HCT 37.1 37.0  PLT 312 321   BMET  Recent Labs  05/15/15 0532 05/16/15 0330  NA 135 135  K 3.5 3.7  CL 97* 98*  CO2 29 29  GLUCOSE 126* 91  BUN 18 10  CREATININE 0.94 0.99  CALCIUM 8.9 8.6*    Studies/Results: Ct Head Wo Contrast  05/16/2015  CLINICAL DATA:  Subdural hematoma. Sudden onset of confusion and a aphasia. Headache. EXAM: CT HEAD WITHOUT CONTRAST TECHNIQUE: Contiguous axial images were obtained from the base of the skull through the vertex without intravenous contrast. COMPARISON:  CT head without contrast at 7:20 a.m., the same day. FINDINGS: There is no significant interval change and a hyperdense subdural collection on the left. The largest component is posteriorly on the parietal lobe measuring up to 10 mm. It is hyperdense blood layering on the tentorium and along the anterior falx. There is effacement of sulci in the left parietal and occipital lobe without significant interval change. Minimal subdural blood on the right is stable. No new hemorrhage is present. No acute infarct is evident.  There is no significant midline shift. The posterior fossa is unremarkable. The paranasal sinuses and mastoid air cells are clear. The calvarium is intact. IMPRESSION: 1. Stable appearance of diffuse left-sided hyperdense subdural hematoma. 2. Trace right subdural blood is stable. 3. No new hemorrhage. 4. No significant interval change. Electronically Signed   By: Marin Roberts M.D.   On: 05/16/2015 18:36   Ct Head Wo Contrast  05/16/2015  CLINICAL DATA:  82 year old female with left greater than right subdural hematoma after a fall. Initial encounter. EXAM: CT HEAD WITHOUT CONTRAST TECHNIQUE: Contiguous axial images were obtained from the base of the skull through the vertex without intravenous contrast. COMPARISON:  05/15/2015. FINDINGS: Visible paranasal sinuses and mastoids remain clear. Stable visualized osseous structures. Negative orbit and scalp soft tissues. Hyperdense left greater than right small subdural hematomas persist. Mild regression of the broad-based left side subdural, measuring 11 mm or less in thickness now (previously 14 mm). Small volume of blood again layering along the falx and left tentorium. Trace hyperdense right subdural hematoma has not significantly changed and is most apparent on series 21, image 15. No midline shift. No ventriculomegaly. Basilar cisterns remain patent. There is a small volume of subdural hematoma in the left posterior fossa. Gray-white matter differentiation remains normal. No cortically based acute infarct identified. IMPRESSION: 1. Slight regression of the broad-based left subdural hematoma which is present throughout the supratentorial and infratentorial compartments. 2. Trace right subdural hematoma appears  stable. 3. No significant intracranial mass effect. No new intracranial abnormality. Electronically Signed   By: Odessa Fleming M.D.   On: 05/16/2015 07:42    Assessment/Plan: Left subdural hematoma, decreased mental status, aphasia: Except for the  aphasia, the patient's exam is nonfocal. Her head CTs have been stable. Her sodium is normal. There has been no witnessed seizure activity, an EEG is pending. She is on anticonvulsants. I think observation is warranted. The subdural hematoma does not appear large enough to warrant surgery.  LOS: 2 days     Caylor Cerino D 05/17/2015, 12:06 PM

## 2015-05-17 NOTE — Progress Notes (Signed)
MD was paged that MRI was not done due to patient's inability to lay still.  MD stated that it has to be done.  MRI was called and informed them that if we have to sedate her for more, then they have to call me 15 minutes before they come here to pick her up.

## 2015-05-17 NOTE — Progress Notes (Signed)
  of Ativan was administered at 0415 when pt was about to be transferred to MRI.  Pt did not tolerate the MRI what so ever and was trying to crawl out.  Pt does not follow commands or seem to fully understand what is going on around her.  I don't imagine her being able to make it through the scan without more sedation. RN will continue to monitor.

## 2015-05-17 NOTE — Progress Notes (Signed)
[facility] TEAM 1 - Stepdown/ICU TEAM Progress Note  FLORENTINA MARQUART ZOX:[id number] DOB: Mar 01, 1926 DOA: 05/15/2015 PCP: Roxy Manns, MD  Admit HPI / Brief Narrative: 82 y.o. WF PMHx HTN, HLD, OA, Chronic Pain Syndrome on chronic oxycodone, Central Retinal Vein occlusion  Presents with fall.  The patient was in her usual state of health until tonight around 4AM when she got up to get a pain pill, and shortly after rolled off the couch (where she normally sleeps) striking her face on the floor. She had face pain and so she came to the ER. At this time, she describes right hip pain (chronic) as her chief complaint, resolving frontal HA, and nausea, without vomiting, confusion, focal weakness, seizure, vision changes. She does not take aspirin, other antiplatelet, warfarin, or other AC.  In the ED, she was hemodynamically stable. A CT of the head showed a left occipital SDH. The case was discussed with Neurosurgery Dr. Lovell Sheehan who requested admission to [facility] for evaluation.   HPI/Subjective: 2/2 A/O 0, patient unresponsive to verbal commands, will withdraw to pain   Assessment/Plan: Lt SDH  -Pt not on any blood thinners  - f/u CT head 2/1 noted slight regression of Lt SDH  - she does not live alone  Altered mental status -Currently patient laying naked in the bed, unresponsive to verbal commands/questions -Withdraws to painful stimuli -NPO until patient's mental status improves  Seizure?/CVA? -Patient was started on Keppra 500 mg twice a day -EEG pending -Brain MRI pending  HTN -Elevated BP which would be consistent with SDH/CVA will allow permissive HTN - Hydralazine 5 mg SBP> 170 or DBP> 105 -BP goal SBP 140-170   Chronic pain syndrome/OA pain -Continue home oxycodone w/o change   Conscious   Code Status: DO NOT RESUSCITATE Family Communication: no family present at time of exam Disposition Plan: SNF?    Consultants: Dr. Tressie Stalker  Neurosurgery  Procedure/Significant Events:    Culture NA  Antibiotics: NA  DVT prophylaxis: SCD   Devices NA   LINES / TUBES:      Continuous Infusions:   Objective: VITAL SIGNS: Temp: 99.5 F (37.5 C) (02/02 0737) Temp Source: Axillary (02/02 0737) BP: 165/86 mmHg (02/02 0737) Pulse Rate: 114 (02/02 0400) SPO2; FIO2:   Intake/Output Summary (Last 24 hours) at 05/17/15 1042 Last data filed at 05/16/15 1700  Gross per 24 hour  Intake    360 ml  Output    200 ml  Net    160 ml     Exam: General:A/O 0, patient unresponsive to verbal commands, will withdraw to pain, cachectic, No acute respiratory distress Eyes: Negative headache, negative scleral hemorrhage ENT: Negative Runny nose, negative gingival bleeding, Neck:  Negative scars, masses, torticollis, lymphadenopathy, JVD Lungs: Clear to auscultation bilaterally without wheezes or crackles Cardiovascular: Regular rate and rhythm without murmur gallop or rub normal S1 and S2 Abdomen:negative abdominal pain, nondistended, positive soft, bowel sounds, no rebound, no ascites, no appreciable mass Extremities: No significant cyanosis, clubbing, or edema bilateral lower extremities Psychiatric:  Unable to evaluate secondary to altered mental status Neurologic:  Patient did move all extremities spontaneously, withdraws to pain,Unable to complete evaluation  secondary to altered mental status     Data Reviewed: Basic Metabolic Panel:  Recent Labs Lab 05/15/15 0532 05/16/15 0330  NA 135 135  K 3.5 3.7  CL 97* 98*  CO2 29 29  GLUCOSE 126* 91  BUN 18 10  CREATININE 0.94 0.99  CALCIUM 8.9 8.6*  Liver Function Tests: No results for input(s): AST, ALT, ALKPHOS, BILITOT, PROT, ALBUMIN in the last 168 hours. No results for input(s): LIPASE, AMYLASE in the last 168 hours. No results for input(s): AMMONIA in the last 168 hours. CBC:  Recent Labs Lab 05/15/15 0532 05/16/15 0330  WBC 13.5* 10.5   NEUTROABS 11.1*  --   HGB 12.3 12.6  HCT 37.1 37.0  MCV 88.1 88.3  PLT 312 321   Cardiac Enzymes: No results for input(s): CKTOTAL, CKMB, CKMBINDEX, TROPONINI in the last 168 hours. BNP (last 3 results) No results for input(s): BNP in the last 8760 hours.  ProBNP (last 3 results) No results for input(s): PROBNP in the last 8760 hours.  CBG:  Recent Labs Lab 05/17/15 0727  GLUCAP 72    Recent Results (from the past 240 hour(s))  MRSA PCR Screening     Status: None   Collection Time: 05/15/15  1:18 PM  Result Value Ref Range Status   MRSA by PCR NEGATIVE NEGATIVE Final    Comment:        The GeneXpert MRSA Assay (FDA approved for NASAL specimens only), is one component of a comprehensive MRSA colonization surveillance program. It is not intended to diagnose MRSA infection nor to guide or monitor treatment for MRSA infections.      Studies:  Recent x-ray studies have been reviewed in detail by the Attending Physician  Scheduled Meds:  Scheduled Meds: . hydrochlorothiazide  25 mg Oral Daily  . levETIRAcetam  500 mg Oral BID  . midazolam  2 mg Intravenous Once  . sodium chloride flush  3 mL Intravenous Q12H    Time spent on care of this patient: 40 mins   WOODS, Roselind Messier , MD  [facility] Office  [phone] Pager - [phone]  On-Call/Text Page:      [email or web address]      password [email or web address]  If 7PM-7AM, please contact night-coverage [email or web address] Password [email or web address] 05/17/2015, 10:42 AM   LOS: 2 days   Care during the described time interval was provided by me .  I have reviewed this patient's available data, including medical history, events of note, physical examination, and all test results as part of my evaluation. I have personally reviewed and interpreted all radiology studies.   Carolyne Littles, MD [phone] Pager

## 2015-05-17 NOTE — Procedures (Signed)
History: 82 year old female with left subdural hematoma  Sedation: None  Technique: This is a 21 channel routine scalp EEG performed at the bedside with bipolar and monopolar montages arranged in accordance to the international 10/20 system of electrode placement. One channel was dedicated to EKG recording.    Background: There is focal left hemisphere delta maximal over the left temporal region. Following painful stimulation, there is a posterior dominant rhythm that is seen with a frequency of 8 Hz. In maximal wakefulness, there is mild generalized irregular delta activity as well.  Photic stimulation: Physiologic driving is now performed  EEG Abnormalities: 1) focal left hemisphere delta activity maximal in the temporal region y 2) generalized irregular delta activity  Clinical Interpretation: This EEG is consistent with a nonspecific left hemispheric focal cerebral dysfunction(likely consistent with the patient's known hematoma) in the setting of a generalized nonspecific cerebral dysfunction (encephalopathy) There was no seizure or seizure predisposition recorded on this study.   Ritta Slot, MD [facility] [phone]  If 7pm- 7am, please page neurology on call as listed in AMION.

## 2015-05-18 DIAGNOSIS — R4701 Aphasia: Secondary | ICD-10-CM | POA: Insufficient documentation

## 2015-05-18 LAB — GLUCOSE, CAPILLARY: Glucose-Capillary: 84 mg/dL (ref 65–99)

## 2015-05-18 MED ORDER — LEVETIRACETAM 500 MG PO TABS: 500.0000 mg | ORAL_TABLET | Freq: Two times a day (BID) | ORAL | Status: DC

## 2015-05-18 NOTE — Care Management Important Message (Signed)
Important Message  Patient Details  Name: MERCEDIES GANESH MRN: [id number] Date of Birth: 08-09-1925   Medicare Important Message Given:  Yes    Kyla Balzarine 05/18/2015, 11:41 AM

## 2015-05-18 NOTE — Discharge Instructions (Signed)
Subdural Hematoma A subdural hematoma is a collection of blood between the brain and its tough outermost membrane covering (the dura). Blood clots that form in this area push down on the brain and cause irritation. A subdural hematoma may cause parts of the brain to stop working and eventually cause death.  CAUSES A subdural hematoma is caused by bleeding from a ruptured blood vessel (hemorrhage). The bleeding results from trauma to the head, such as from a fall or motor vehicle accident. There are two types of subdural hemorrhages:  Acute. This type develops shortly after a serious blow to the head and causes blood to collect very quickly. If not diagnosed and treated promptly, severe brain injury or death can occur.  Chronic. This is when bleeding develops more slowly, over weeks or months. RISK FACTORS People at risk for subdural hematoma include older persons, infants, and alcoholics. SYMPTOMS An acute subdural hemorrhage develops over minutes to hours. Symptoms can include:  Temporary loss of consciousness.  Weakness of arms or legs on one side of the body.  Changes in vision or speech.  A severe headache.  Seizures.  Nausea and vomiting.  Increased sleepiness. A chronic subdural hemorrhage develops over weeks to months. Symptoms may develop slowly and produce less noticeable problems or changes. Symptoms include:  A mild headache.  A change in personality.  Loss of balance or difficulty walking.  Weakness, numbness, or tingling in the arms or legs.  Nausea or vomiting.  Memory loss.  Double vision.  Increased sleepiness. DIAGNOSIS Your health care provider will perform a thorough physical and neurological exam. A CT scan or MRI may also be done. If there is blood on the scan, its color will help your health care provider determine how long the hemorrhage has been there. TREATMENT If the cause is an acute subdural hemorrhage, immediate treatment is needed. In many  cases an emergency surgery is performed to drain accumulated blood or to remove the blood clot. Sometimes steroid or diuretic medicines or controlled breathing through a ventilator is needed to decrease pressure in the brain. This is especially true if there is any swelling of the brain. If the cause is a chronic subdural hemorrhage, treatment depends on a variety of factors. Sometimes no treatment is needed. If the subdural hematoma is small and causes minimal or no symptoms, you may be treated with bed rest, medicines, and observation. If the hemorrhage is large or if you have neurological symptoms, an emergency surgery is usually needed to remove the blood clot. People who develop a subdural hemorrhage are at risk of developing seizures, even after the subdural hematoma has been treated. You may be prescribed an anti-seizure (anticonvulsant) medicine for a year or longer. HOME CARE INSTRUCTIONS  Only take medicines as directed by your health care provider.  Rest if directed by your health care provider.  Keep all follow-up appointments with your health care provider.  If you play a contact sport such as football, hockey or soccer and you experienced a significant head injury, allow enough time for healing (up to 15 days) before you start playing again. A repeated injury that occurs during this fragile repair period is likely to result in hemorrhage. This is called the second impact syndrome. SEEK IMMEDIATE MEDICAL CARE IF:  You fall or experience minor trauma to your head and you are taking blood thinners. If you are on any blood thinners even a very small injury can cause a subdural hematoma. You should not hesitate to seek  medical attention regardless of how minor you think your symptoms are.  You experience a head injury and have:  Drowsiness or a decrease in alertness.  Confusion or forgetfulness.  Slurred speech.  Irrational or aggressive behavior.  Numbness or paralysis in any part  of the body.  A feeling of being sick to your stomach (nauseous) or you throw up (vomit).  Difficulty walking or poor coordination.  Double vision.  Seizures.  A bleeding disorder.  A history of heavy alcohol use.  Clear fluid draining from your nose or ears.  Personality changes.  Difficulty thinking.  Worsening symptoms. MAKE SURE YOU:  Understand these instructions.  Will watch your condition.  Will get help right away if you are not doing well or get worse. FOR MORE INFORMATION National Institute of Neurological Disorders and Stroke: [employer] American Association of Neurological Surgeons: www.neurosurgerytoday.org American Academy of Neurology (AAN): ComparePet[employer]cz Brain Injury Association of America: www.biausa.org   This information is not intended to replace advice given to you by your health care provider. Make sure you discuss any questions you have with your health care provider.   Document Released: 02/16/2004 Document Revised: 01/19/2013 Document Reviewed: 10/01/2012 Elsevier Interactive Patient Education 2016 [employer].   Nonepileptic Seizures  Nonepileptic seizures are seizures that are not caused by abnormal electrical signals in your brain. These seizures often seem like epileptic seizures, but they are not caused by epilepsy.  There are two types of nonepileptic seizures:  A physiologic nonepileptic seizure results from a disruption in your brain.  A psychogenic seizure results from emotional stress. These seizures are sometimes called pseudoseizures. CAUSES  Causes of physiologic nonepileptic seizures include:   Sudden drop in blood pressure.  Low blood sugar.  Low levels of salt (sodium) in your blood.  Low levels of calcium in your blood.  Migraine.  Heart rhythm problems.  Sleep disorders.  Drug and alcohol abuse. Common causes of psychogenic nonepileptic seizures include:  Stress.  Emotional trauma.  Sexual or  physical abuse.  Major life events, such as divorce or the death of a loved one.  Mental health disorders, including panic attack and hyperactivity disorder. SIGNS AND SYMPTOMS A nonepileptic seizure can look like an epileptic seizure, including uncontrollable shaking (convulsions), or changes in attention, behavior, or the ability to remain awake and alert. However, there are some differences. Nonepileptic seizures usually:  Do not cause physical injuries.  Start slowly.  Include crying or shrieking.  Last longer than 2 minutes.  Have a short recovery time without headache or exhaustion. DIAGNOSIS  Your health care provider can usually diagnose nonepileptic seizures after taking your medical history and giving you a physical exam. Your health care provider may want to talk to your friends or relatives who have seen you have a seizure.  You may also need to have tests to look for causes of physiologic nonepileptic seizures. This may include an electroencephalogram (EEG), which is a test that measures electrical activity in your brain. If you have had an epileptic seizure, the results of your EEG will be abnormal. If your health care provider thinks you have had a psychogenic nonepileptic seizure, you may need to see a mental health specialist for an evaluation. TREATMENT  Treatment depends on the type and cause of your seizures.  For physiologic nonepileptic seizures, treatment is aimed at addressing the underlying condition that caused the seizures. These seizures usually stop when the underlying condition is properly treated.  Nonepileptic seizures do not respond to the seizure  medicines used to treat epilepsy.  For psychogenic seizures, you may need to work with a mental health specialist. HOME CARE INSTRUCTIONS Home care will depend on the type of nonepileptic seizures you have.   Follow all your health care provider's instructions.  Keep all your follow-up appointments. SEEK  MEDICAL CARE IF: You continue to have seizures after treatment. SEEK IMMEDIATE MEDICAL CARE IF:  Your seizures change or become more frequent.  You injure yourself during a seizure.  You have one seizure after another.  You have trouble recovering from a seizure.  You have chest pain or trouble breathing. MAKE SURE YOU:  Understand these instructions.  Will watch your condition.  Will get help right away if you are not doing well or get worse.   This information is not intended to replace advice given to you by your health care provider. Make sure you discuss any questions you have with your health care provider.   Document Released: 05/16/2005 Document Revised: 04/21/2014 Document Reviewed: 01/25/2013 Elsevier Interactive Patient Education [employer]

## 2015-05-18 NOTE — Progress Notes (Signed)
Patient ID: Victoria Ball, female   DOB: Feb 24, 1926, 82 y.o.   MRN: [id number] Subjective:  The patient is much more alert. She is pleasant. She is in no apparent distress. She wants to go home.  Objective: Vital signs in last 24 hours: Temp:  [97.6 F (36.4 C)-100.2 F (37.9 C)] 98.5 F (36.9 C) (02/03 0442) Pulse Rate:  [94-114] 94 (02/03 0400) Resp:  [19-27] 20 (02/03 0400) BP: (132-170)/(85-98) 153/98 mmHg (02/03 0400) SpO2:  [96 %-100 %] 97 % (02/03 0400)  Intake/Output from previous day: 02/02 0701 - 02/03 0700 In: 553 [I.V.:453; IV Piggyback:100] Out: 250 [Urine:250] Intake/Output this shift:    Physical exam the patient is alert and oriented 3. Her speech is normal. Her strength is normal bilaterally. Her pupils are equal.  The patient's EEG does not demonstrate any seizure activity.  Lab Results:  Recent Labs  05/16/15 0330  WBC 10.5  HGB 12.6  HCT 37.0  PLT 321   BMET  Recent Labs  05/16/15 0330  NA 135  K 3.7  CL 98*  CO2 29  GLUCOSE 91  BUN 10  CREATININE 0.99  CALCIUM 8.6*    Studies/Results: Ct Head Wo Contrast  05/16/2015  CLINICAL DATA:  Subdural hematoma. Sudden onset of confusion and a aphasia. Headache. EXAM: CT HEAD WITHOUT CONTRAST TECHNIQUE: Contiguous axial images were obtained from the base of the skull through the vertex without intravenous contrast. COMPARISON:  CT head without contrast at 7:20 a.m., the same day. FINDINGS: There is no significant interval change and a hyperdense subdural collection on the left. The largest component is posteriorly on the parietal lobe measuring up to 10 mm. It is hyperdense blood layering on the tentorium and along the anterior falx. There is effacement of sulci in the left parietal and occipital lobe without significant interval change. Minimal subdural blood on the right is stable. No new hemorrhage is present. No acute infarct is evident. There is no significant midline shift. The posterior fossa is  unremarkable. The paranasal sinuses and mastoid air cells are clear. The calvarium is intact. IMPRESSION: 1. Stable appearance of diffuse left-sided hyperdense subdural hematoma. 2. Trace right subdural blood is stable. 3. No new hemorrhage. 4. No significant interval change. Electronically Signed   By: Marin Roberts M.D.   On: 05/16/2015 18:36   Ct Head Wo Contrast  05/16/2015  CLINICAL DATA:  82 year old female with left greater than right subdural hematoma after a fall. Initial encounter. EXAM: CT HEAD WITHOUT CONTRAST TECHNIQUE: Contiguous axial images were obtained from the base of the skull through the vertex without intravenous contrast. COMPARISON:  05/15/2015. FINDINGS: Visible paranasal sinuses and mastoids remain clear. Stable visualized osseous structures. Negative orbit and scalp soft tissues. Hyperdense left greater than right small subdural hematomas persist. Mild regression of the broad-based left side subdural, measuring 11 mm or less in thickness now (previously 14 mm). Small volume of blood again layering along the falx and left tentorium. Trace hyperdense right subdural hematoma has not significantly changed and is most apparent on series 21, image 15. No midline shift. No ventriculomegaly. Basilar cisterns remain patent. There is a small volume of subdural hematoma in the left posterior fossa. Gray-white matter differentiation remains normal. No cortically based acute infarct identified. IMPRESSION: 1. Slight regression of the broad-based left subdural hematoma which is present throughout the supratentorial and infratentorial compartments. 2. Trace right subdural hematoma appears stable. 3. No significant intracranial mass effect. No new intracranial abnormality. Electronically Signed  By: Odessa Fleming M.D.   On: 05/16/2015 07:42    Assessment/Plan: Left subdural hematoma: The patient is much better today. I suspect her son decreased mental status was secondary to a seizure. She is  better today. She can be discharged from my point of view. With me in the office a couple weeks. Please call if I can be of further assistance.  LOS: 3 days     Kellyann Ordway D 05/18/2015, 7:28 AM

## 2015-05-18 NOTE — Discharge Summary (Signed)
DISCHARGE SUMMARY  Victoria Ball  MR#: [id number]  DOB:[birth year]  Date of Admission: 05/15/2015 Date of Discharge: 05/18/2015  Attending Physician:Kyon Bentler T  Patient's UUV:OZDGU Tower, MD  Consults: Neurosurgery  Disposition: D/C home   Follow-up Appts:     Follow-up Information    Follow up with Cristi Loron, MD. Schedule an appointment as soon as possible for a visit in 2 weeks.   Specialty:  Neurosurgery   Why:  Dr. Lovell Sheehan secretary will call you with your appointment    Contact information:   [address] [place] [address] [phone]      Discharge Diagnoses: SDH Expressive aphasia - focal seizure  HTN Chronic OA pain  Initial presentation: 82 y.o. female with a history significant of HTN and OA on chronic oxycodone who presented after a fall.  The patient got up to get a pain pill, and shortly after rolled off the couch (where she normally sleeps) striking her face on the floor. She had pain and came to the ER. She does not take aspirin, warfarin, or other AC.  In the ED a CT of the head showed a left occipital SDH. The case was discussed with Neurosurgery who requested admission to [facility] for observation.   Hospital Course:  L SDH  Pt not on any blood thinners - f/u CT head 2/1 noted slight regression of L SDH - clinically pt remained stable - she does not live alone   Expressive aphasia - focal seizure  Just prior to the previously planned discharge on 05/16/15 the patient developed acute onset of expressive aphasia - stat repeat CT of head revealed stability of subdural hemorrhages - attempts were made to accomplish an MRI but the patient was not able to tolerate this due to altered mental status and agitation - the primary concern was that of a probable focal seizure related to her subdural hemorrhage - she was empirically placed on Keppra - after what appeared to be a period of postictal confusion the patient  stabilized on Keppra - on the date of discharge her mental status was clear and her response to all questions was rapid and entirely normal - she was cleared for discharge home by Neurosurgery with follow-up in their office  HTN BP reasonably controlled accounting for pain - no change in tx regimen at time of d/c   Chronic OA pain Continue home oxycodone w/o change     Medication List    TAKE these medications        acetaminophen 325 MG tablet  Commonly known as:  TYLENOL  Take 1-2 tablets (325-650 mg total) by mouth every 6 (six) hours as needed for mild pain or headache.     amitriptyline 25 MG tablet  Commonly known as:  ELAVIL  Take 1 tablet (25 mg total) by mouth at bedtime as needed for sleep.     cholecalciferol 1000 units tablet  Commonly known as:  VITAMIN D  Take 1,000 Units by mouth daily.     hydrochlorothiazide 25 MG tablet  Commonly known as:  HYDRODIURIL  Take 1 tablet (25 mg total) by mouth daily.     levETIRAcetam 500 MG tablet  Commonly known as:  KEPPRA  Take 1 tablet (500 mg total) by mouth 2 (two) times daily.     oxyCODONE 15 MG immediate release tablet  Commonly known as:  ROXICODONE  Take 15 mg by mouth every 6 (six) hours as needed for pain.  Day of Discharge BP 138/72 mmHg  Pulse 115  Temp(Src) 97.9 F (36.6 C) (Oral)  Resp 22  Ht  (1.651 m)  Wt 48.6 kg (107 lb 2.3 oz)  BMI 17.83 kg/m2  SpO2 96%  LMP 04/14/1977  Physical Exam: General: alert and conversant  Lungs: Clear to auscultation bilaterally without wheezes or crackles Cardiovascular: Regular rate and rhythm without murmur gallop or rub normal S1 and S2 Abdomen: Nontender, nondistended, soft, bowel sounds positive, no rebound, no ascites, no appreciable mass Extremities: No significant cyanosis, clubbing, or edema bilateral lower extremities Neuro:  Cranial nerves II through XII intact bilaterally, alert and oriented 4, 5 over 5 strength bilateral upper and lower  extremities  Basic Metabolic Panel:  Recent Labs Lab 05/15/15 0532 05/16/15 0330  NA 135 135  K 3.5 3.7  CL 97* 98*  CO2 29 29  GLUCOSE 126* 91  BUN 18 10  CREATININE 0.94 0.99  CALCIUM 8.9 8.6*   Coags:  Recent Labs Lab 05/15/15 0532  INR 1.01   CBC:  Recent Labs Lab 05/15/15 0532 05/16/15 0330  WBC 13.5* 10.5  NEUTROABS 11.1*  --   HGB 12.3 12.6  HCT 37.1 37.0  MCV 88.1 88.3  PLT 312 321    Recent Results (from the past 240 hour(s))  MRSA PCR Screening     Status: None   Collection Time: 05/15/15  1:18 PM  Result Value Ref Range Status   MRSA by PCR NEGATIVE NEGATIVE Final    Comment:        The GeneXpert MRSA Assay (FDA approved for NASAL specimens only), is one component of a comprehensive MRSA colonization surveillance program. It is not intended to diagnose MRSA infection nor to guide or monitor treatment for MRSA infections.      Time spent in discharge (includes decision making & examination of pt): >30 minutes  05/18/2015, 11:11 AM   Lonia Blood, MD [facility] Office  [phone] Pager [phone]  On-Call/Text Page:      [email or web address]      password [facility]

## 2015-05-22 ENCOUNTER — Telehealth: Payer: Self-pay

## 2015-05-22 NOTE — Telephone Encounter (Signed)
Attempted to reach patient to determine current health status. No VM present. Pending f/u to be scheduled with neurosurgeon.

## 2015-05-22 NOTE — Telephone Encounter (Signed)
Aware, thanks!

## 2015-06-21 ENCOUNTER — Other Ambulatory Visit: Payer: Self-pay | Admitting: Neurosurgery

## 2015-06-21 DIAGNOSIS — S065XAA Traumatic subdural hemorrhage with loss of consciousness status unknown, initial encounter: Secondary | ICD-10-CM

## 2015-06-21 DIAGNOSIS — S065X9A Traumatic subdural hemorrhage with loss of consciousness of unspecified duration, initial encounter: Secondary | ICD-10-CM

## 2015-06-27 ENCOUNTER — Ambulatory Visit
Admission: RE | Admit: 2015-06-27 | Discharge: 2015-06-27 | Disposition: A | Discharge status: Home / Self Care | Payer: Medicare HMO | Source: Ambulatory Visit | Attending: Neurosurgery | Admitting: Neurosurgery

## 2015-06-27 DIAGNOSIS — S065XAA Traumatic subdural hemorrhage with loss of consciousness status unknown, initial encounter: Secondary | ICD-10-CM

## 2015-06-27 DIAGNOSIS — S065X9A Traumatic subdural hemorrhage with loss of consciousness of unspecified duration, initial encounter: Secondary | ICD-10-CM

## 2015-08-01 ENCOUNTER — Ambulatory Visit (INDEPENDENT_AMBULATORY_CARE_PROVIDER_SITE_OTHER): Payer: Medicare HMO | Admitting: Family Medicine

## 2015-08-01 ENCOUNTER — Encounter: Payer: Self-pay | Admitting: Family Medicine

## 2015-08-01 VITALS — BP 134/80 | HR 89 | Temp 97.6°F | Ht 64.0 in | Wt 100.8 lb

## 2015-08-01 DIAGNOSIS — R569 Unspecified convulsions: Secondary | ICD-10-CM

## 2015-08-01 DIAGNOSIS — I10 Essential (primary) hypertension: Secondary | ICD-10-CM

## 2015-08-01 DIAGNOSIS — L218 Other seborrheic dermatitis: Secondary | ICD-10-CM | POA: Diagnosis not present

## 2015-08-01 DIAGNOSIS — S065X9A Traumatic subdural hemorrhage with loss of consciousness of unspecified duration, initial encounter: Secondary | ICD-10-CM

## 2015-08-01 DIAGNOSIS — K5909 Other constipation: Secondary | ICD-10-CM | POA: Diagnosis not present

## 2015-08-01 DIAGNOSIS — Z23 Encounter for immunization: Secondary | ICD-10-CM

## 2015-08-01 DIAGNOSIS — L219 Seborrheic dermatitis, unspecified: Secondary | ICD-10-CM

## 2015-08-01 DIAGNOSIS — S065XAA Traumatic subdural hemorrhage with loss of consciousness status unknown, initial encounter: Secondary | ICD-10-CM

## 2015-08-01 DIAGNOSIS — I6200 Nontraumatic subdural hemorrhage, unspecified: Secondary | ICD-10-CM

## 2015-08-01 MED ORDER — KETOCONAZOLE 2 % EX SHAM: 1.0000 "application " | MEDICATED_SHAMPOO | CUTANEOUS | Status: DC

## 2015-08-01 MED ORDER — LINACLOTIDE 145 MCG PO CAPS: 145.0000 ug | ORAL_CAPSULE | Freq: Every day | ORAL | Status: DC

## 2015-08-01 NOTE — Assessment & Plan Note (Signed)
Per pt -noted by her hair dresser  Will try nizoral shampoo twice weekly and update if no improvement

## 2015-08-01 NOTE — Progress Notes (Signed)
Subjective:    Patient ID: Victoria Ball, female    DOB: 1925/09/22, 82 y.o.   MRN: [id number]  HPI Here for f/u of chronic health problems   Had a SDH from a fall off the couch in Feb - and had seizure when in the hiospital  Still sees neuro surgeon - Dr Linus Galas f/u in June   Otherwise - doing ok except for chronic constipation from narcotics  Has tried otc remedies  Tried miralax - helps a bit but not entirely  Also prune juice  Fiber helps only temporarily   Would not have a bm at all unless she takes something  Either fiber or miralax or a laxative otc (she takes a brand from [employer])  Does eat a fair amt of fruits and veg -not enough Drinks quite a bit of water   Wt is back down to baseline with bmi of 17 (she thinks she eats about the same)   Still on hctz for her HTN bp is stable today  No cp or palpitations or headaches or edema  No side effects to medicines  BP Readings from Last 3 Encounters:  08/01/15 134/80  05/18/15 138/72  03/21/15 144/78      On oxycodone for the chronic pain - from the pain clinic   Needs something for flaking of scalp and also some sores  Not using medicated shampoo   Patient Active Problem List   Diagnosis Date Noted  . Seborrheic dermatitis of scalp 08/01/2015  . Expressive aphasia   . Seizures (HCC)   . Subdural hematoma (HCC) 05/15/2015  . Underweight 03/21/2015  . Constipation 03/21/2015  . Elevated serum creatinine 03/21/2015  . Insomnia 08/08/2014  . Loss of weight 03/28/2014  . Leukocytosis 03/28/2014  . Osteoarthritis of both hips 09/26/2013  . Right hip pain 09/02/2013  . Encounter for Medicare annual wellness exam 12/20/2012  . History of central retinal vein occlusion   . HYPERTENSION, BENIGN ESSENTIAL 03/27/2009  . BACK PAIN 08/31/2008  . Vitamin D deficiency 02/07/2008  . HYPERCHOLESTEROLEMIA 01/27/2007  . RETINAL VEIN OCCLUSION 01/27/2007  . HIP PAIN, RIGHT, CHRONIC 01/27/2007  . Osteoporosis  01/27/2007   Past Medical History  Diagnosis Date  . OP (osteoporosis)     stopped Evista after 5 years  . HLD (hyperlipidemia)   . History of central retinal vein occlusion   . Hypertension   . Arthritis   . Subdural hematoma, acute (HCC) 05/15/2015    left; "rolled off couch and hit head"   Past Surgical History  Procedure Laterality Date  . Abdominal hysterectomy  1994    fibroid tumor  . Cataract extraction w/ intraocular lens  implant, bilateral Bilateral    Social History  Substance Use Topics  . Smoking status: Passive Smoke Exposure - Never Smoker  . Smokeless tobacco: Never Used  . Alcohol Use: No   Family History  Problem Relation Age of Onset  . Coronary artery disease Father   . Diabetes Father     ?   Marland Kitchen Heart attack Sister 62  . Coronary artery disease Brother     CABG  . Coronary artery disease Brother     CABG  . Coronary artery disease Brother     CABG  . Angelman syndrome Paternal Grandfather    No Known Allergies Current Outpatient Prescriptions on File Prior to Visit  Medication Sig Dispense Refill  . acetaminophen (TYLENOL) 325 MG tablet Take 1-2 tablets (325-650 mg total) by mouth  every 6 (six) hours as needed for mild pain or headache.    Marland Kitchen. amitriptyline (ELAVIL) 25 MG tablet Take 1 tablet (25 mg total) by mouth at bedtime as needed for sleep. 30 tablet 11  . cholecalciferol (VITAMIN D) 1000 UNITS tablet Take 1,000 Units by mouth daily.      . hydrochlorothiazide (HYDRODIURIL) 25 MG tablet Take 1 tablet (25 mg total) by mouth daily. 30 tablet 11  . levETIRAcetam (KEPPRA) 500 MG tablet Take 1 tablet (500 mg total) by mouth 2 (two) times daily. 60 tablet 0  . oxyCODONE (ROXICODONE) 15 MG immediate release tablet Take 15 mg by mouth every 6 (six) hours as needed for pain.   0   No current facility-administered medications on file prior to visit.    Review of Systems Review of Systems  Constitutional: Negative for fever, appetite change,  and  unexpected weight change.  Eyes: Negative for pain and visual disturbance.  Respiratory: Negative for cough and shortness of breath.   Cardiovascular: Negative for cp or palpitations    Gastrointestinal: Negative for nausea, diarrhea and pos for chronic constipation. neg for blood in stool, pos for straining  Genitourinary: Negative for urgency and frequency.  Skin: Negative for pallor or rash  MSK pos for hip pain controlled by oxycodone from pain clinic   Neurological: Negative for weakness, light-headedness, numbness and headaches.  Hematological: Negative for adenopathy. Does not bruise/bleed easily.  Psychiatric/Behavioral: Negative for dysphoric mood. The patient is not nervous/anxious.         Objective:   Physical Exam  Constitutional: She appears well-developed and well-nourished. No distress.  Underweight-baseline  HENT:  Head: Normocephalic and atraumatic.  Mouth/Throat: Oropharynx is clear and moist.  Eyes: Conjunctivae and EOM are normal. Pupils are equal, round, and reactive to light.  Neck: Normal range of motion. Neck supple. No JVD present. Carotid bruit is not present. No thyromegaly present.  Cardiovascular: Normal rate, regular rhythm, normal heart sounds and intact distal pulses.  Exam reveals no gallop.   Pulmonary/Chest: Effort normal and breath sounds normal. No respiratory distress. She has no wheezes. She has no rales.  No crackles  Abdominal: Soft. Bowel sounds are normal. She exhibits no distension, no abdominal bruit and no mass. There is no tenderness. There is no rebound and no guarding.  Musculoskeletal: She exhibits no edema or tenderness.  Lymphadenopathy:    She has no cervical adenopathy.  Neurological: She is alert. She has normal reflexes. She displays no tremor. No cranial nerve deficit. She exhibits normal muscle tone. Coordination normal.  Skin: Skin is warm and dry. No rash noted. No pallor.  Psychiatric: She has a normal mood and affect.           Assessment & Plan:   Problem List Items Addressed This Visit      Cardiovascular and Mediastinum   HYPERTENSION, BENIGN ESSENTIAL - Primary    bp in fair control at this time  BP Readings from Last 1 Encounters:  08/01/15 134/80   No changes needed Disc lifstyle change with low sodium diet and exercise  Continues hctz  Labs rev from feb         Digestive   Constipation    Ongoing with chronic opiate use from pain clinic  Enc inc water and fiber Failed otc agents  Will try linzess at the 145 mg dose daily -inst in use - daily as needed  If side eff or persistent diarrhea inst to stop it and update  [place]  F/u 2 mo         Nervous and Auditory   Subdural hematoma (HCC)    In 2/17 after falling off couch Per pt now doing well - she is symptom and seizure free Sees Dr Lovell Sheehan -neuro surg , next visit this summer  Disc fall prev        Musculoskeletal and Integument   Seborrheic dermatitis of scalp    Per pt -noted by her hair dresser  Will try nizoral shampoo twice weekly and update if no improvement        Other   Seizures (HCC)    After SDH Per pt seizure free since hospitalization -seeing neuro surg Dr Lovell Sheehan       Other Visit Diagnoses    Need for vaccination with 13-polyvalent pneumococcal conjugate vaccine        Relevant Orders    Pneumococcal conjugate vaccine 13-valent (Completed)

## 2015-08-01 NOTE — Patient Instructions (Signed)
Try the Linzess one pill as needed before breakfast with water  You can decide how often you need it  If this gives you diarrhea that you cannot stop- let us know  Try to include more fruits and vegetables with your meals  Follow up with me in about 2 months to see how you are doing   Try the nizoral shampoo for seborrheic dermatitis

## 2015-08-01 NOTE — Assessment & Plan Note (Signed)
bp in fair control at this time  BP Readings from Last 1 Encounters:  08/01/15 134/80   No changes needed Disc lifstyle change with low sodium diet and exercise  Continues hctz  Labs rev from feb

## 2015-08-01 NOTE — Assessment & Plan Note (Signed)
In 2/17 after falling off couch Per pt now doing well - she is symptom and seizure free Sees Dr Lovell SheehanJenkins -neuro surg , next visit this summer  Disc fall prev

## 2015-08-01 NOTE — Assessment & Plan Note (Signed)
Ongoing with chronic opiate use from pain clinic  Enc inc water and fiber Failed otc agents  Will try linzess at the 145 mg dose daily -inst in use - daily as needed  If side eff or persistent diarrhea inst to stop it and update us  F/u 2 mo

## 2015-08-01 NOTE — Progress Notes (Signed)
Pre visit review using our clinic review tool, if applicable. No additional management support is needed unless otherwise documented below in the visit note. 

## 2015-08-01 NOTE — Assessment & Plan Note (Signed)
After SDH Per pt seizure free since hospitalization -seeing neuro surg Dr Lovell SheehanJenkins

## 2015-10-02 ENCOUNTER — Ambulatory Visit (INDEPENDENT_AMBULATORY_CARE_PROVIDER_SITE_OTHER): Payer: Medicare HMO | Admitting: Family Medicine

## 2015-10-02 ENCOUNTER — Encounter: Payer: Self-pay | Admitting: Family Medicine

## 2015-10-02 VITALS — BP 134/70 | HR 77 | Temp 98.6°F | Ht 64.0 in | Wt 97.5 lb

## 2015-10-02 DIAGNOSIS — E7800 Pure hypercholesterolemia, unspecified: Secondary | ICD-10-CM | POA: Diagnosis not present

## 2015-10-02 DIAGNOSIS — I10 Essential (primary) hypertension: Secondary | ICD-10-CM | POA: Diagnosis not present

## 2015-10-02 DIAGNOSIS — S065X9A Traumatic subdural hemorrhage with loss of consciousness of unspecified duration, initial encounter: Secondary | ICD-10-CM

## 2015-10-02 DIAGNOSIS — S065XAA Traumatic subdural hemorrhage with loss of consciousness status unknown, initial encounter: Secondary | ICD-10-CM

## 2015-10-02 DIAGNOSIS — R636 Underweight: Secondary | ICD-10-CM

## 2015-10-02 DIAGNOSIS — K5909 Other constipation: Secondary | ICD-10-CM | POA: Diagnosis not present

## 2015-10-02 DIAGNOSIS — R634 Abnormal weight loss: Secondary | ICD-10-CM

## 2015-10-02 DIAGNOSIS — I6200 Nontraumatic subdural hemorrhage, unspecified: Secondary | ICD-10-CM

## 2015-10-02 MED ORDER — AMITRIPTYLINE HCL 25 MG PO TABS: 25.0000 mg | ORAL_TABLET | Freq: Every day | ORAL | Status: DC

## 2015-10-02 NOTE — Progress Notes (Signed)
Subjective:    Patient ID: Victoria Ball, female    DOB: Jul 03, 1925, 82 y.o.   MRN: [id number]  HPI Here for f/u of chronic medical problems   Wt is down 3 lb with bmi of 16.7 She does not know why  Thinks she eats as well as ever  Usual diet  Oatmeal-bkfst  Sandwich-lunch  Dinner veg and a meat and bread  Does not think she was eating less  She likes meal replacement shakes   Last visit px linzess 145 mg for chronic constipation worsened by opiate use It did not work very well for her - was worth trying  Metamucil is working better over the counter Has a BM almost every day  No blood in stool  Occ has to strain Eats vegetables but not much fruit    No symptoms of cancer of any kind  Mood has been good    Hip hurts her a lot - oxycodone   Also nizoral shampoo for seb derm of scalp-that helped   BP Readings from Last 3 Encounters:  10/02/15 134/70  08/01/15 134/80  05/18/15 138/72  stable control    Chemistry      Component Value Date/Time   NA 135 05/16/2015 0330   K 3.7 05/16/2015 0330   CL 98* 05/16/2015 0330   CO2 29 05/16/2015 0330   BUN 10 05/16/2015 0330   CREATININE 0.99 05/16/2015 0330      Component Value Date/Time   CALCIUM 8.6* 05/16/2015 0330   ALKPHOS 85 01/09/2015 1536   AST 19 01/09/2015 1536   ALT 14 01/09/2015 1536   BILITOT 0.4 01/09/2015 1536      Lab Results  Component Value Date   CHOL 196 01/09/2015   HDL 55.50 01/09/2015   LDLCALC 116* 01/09/2015   LDLDIRECT 118.8 12/14/2012   TRIG 121.0 01/09/2015   CHOLHDL 4 01/09/2015    Hx of vit D def   Has not been back to the neurosurgeon for her subdural injury No headaches  Signed off on that   Declines cancer screening at her age- mammograms  Declines colonoscopy  Is open to cologuard    Patient Active Problem List   Diagnosis Date Noted  . Seborrheic dermatitis of scalp 08/01/2015  . Expressive aphasia   . Subdural hematoma (HCC) 05/15/2015  . Underweight  03/21/2015  . Constipation 03/21/2015  . Elevated serum creatinine 03/21/2015  . Insomnia 08/08/2014  . Loss of weight 03/28/2014  . Leukocytosis 03/28/2014  . Osteoarthritis of both hips 09/26/2013  . Right hip pain 09/02/2013  . Encounter for Medicare annual wellness exam 12/20/2012  . History of central retinal vein occlusion   . HYPERTENSION, BENIGN ESSENTIAL 03/27/2009  . BACK PAIN 08/31/2008  . Vitamin D deficiency 02/07/2008  . HYPERCHOLESTEROLEMIA 01/27/2007  . RETINAL VEIN OCCLUSION 01/27/2007  . HIP PAIN, RIGHT, CHRONIC 01/27/2007  . Osteoporosis 01/27/2007   Past Medical History  Diagnosis Date  . OP (osteoporosis)     stopped Evista after 5 years  . HLD (hyperlipidemia)   . History of central retinal vein occlusion   . Hypertension   . Arthritis   . Subdural hematoma, acute (HCC) 05/15/2015    left; "rolled off couch and hit head"   Past Surgical History  Procedure Laterality Date  . Abdominal hysterectomy  1994    fibroid tumor  . Cataract extraction w/ intraocular lens  implant, bilateral Bilateral    Social History  Substance Use Topics  . Smoking  status: Passive Smoke Exposure - Never Smoker  . Smokeless tobacco: Never Used  . Alcohol Use: No   Family History  Problem Relation Age of Onset  . Coronary artery disease Father   . Diabetes Father     ?   Marland Kitchen Heart attack Sister 73  . Coronary artery disease Brother     CABG  . Coronary artery disease Brother     CABG  . Coronary artery disease Brother     CABG  . Angelman syndrome Paternal Grandfather    No Known Allergies Current Outpatient Prescriptions on File Prior to Visit  Medication Sig Dispense Refill  . acetaminophen (TYLENOL) 325 MG tablet Take 1-2 tablets (325-650 mg total) by mouth every 6 (six) hours as needed for mild pain or headache.    . cholecalciferol (VITAMIN D) 1000 UNITS tablet Take 1,000 Units by mouth daily.      . hydrochlorothiazide (HYDRODIURIL) 25 MG tablet Take 1 tablet  (25 mg total) by mouth daily. 30 tablet 11  . ketoconazole (NIZORAL) 2 % shampoo Apply 1 application topically 2 (two) times a week. Shampoo scalp twice weekly and leave on several minutes before rinsing 120 mL 3  . oxyCODONE (ROXICODONE) 15 MG immediate release tablet Take 15 mg by mouth every 6 (six) hours as needed for pain.   0   No current facility-administered medications on file prior to visit.    Review of Systems Review of Systems  Constitutional: Negative for fever, appetite change,  and pos for unexpected weight change.  Eyes: Negative for pain and visual disturbance.  Respiratory: Negative for cough and shortness of breath.   Cardiovascular: Negative for cp or palpitations    Gastrointestinal: Negative for nausea, diarrhea and constipation.  Genitourinary: Negative for urgency and frequency.  Skin: Negative for pallor or rash   Neurological: Negative for weakness, light-headedness, numbness and headaches.  Hematological: Negative for adenopathy. Does not bruise/bleed easily.  Psychiatric/Behavioral: Negative for dysphoric mood. The patient is not nervous/anxious.         Objective:   Physical Exam  Constitutional: She appears well-developed and well-nourished. No distress.  Underweight and frail appearing elderly female   As usual- smells of smoke she claims is from 2nd hand exposure  HENT:  Head: Normocephalic and atraumatic.  Mouth/Throat: Oropharynx is clear and moist.  Eyes: Conjunctivae and EOM are normal. Pupils are equal, round, and reactive to light. Right eye exhibits no discharge. Left eye exhibits no discharge. No scleral icterus.  Neck: Normal range of motion. Neck supple. No JVD present. Carotid bruit is not present. No thyromegaly present.  Cardiovascular: Normal rate, regular rhythm, normal heart sounds and intact distal pulses.  Exam reveals no gallop.   Pulmonary/Chest: Effort normal and breath sounds normal. No respiratory distress. She has no wheezes.  She has no rales.  No crackles  Abdominal: Soft. Bowel sounds are normal. She exhibits no distension, no abdominal bruit and no mass. There is no tenderness.  Musculoskeletal: She exhibits no edema or tenderness.  Lymphadenopathy:    She has no cervical adenopathy.  Neurological: She is alert. She has normal reflexes. No cranial nerve deficit. She exhibits normal muscle tone. Coordination normal.  Skin: Skin is warm and dry. No rash noted. No pallor.  Psychiatric: She has a normal mood and affect.  Mentally sharp          Assessment & Plan:   Problem List Items Addressed This Visit      Cardiovascular and Mediastinum  HYPERTENSION, BENIGN ESSENTIAL - Primary    bp in fair control at this time  BP Readings from Last 1 Encounters:  10/02/15 134/70   No changes needed Disc lifstyle change with low sodium diet and exercise  Labs today      Relevant Orders   CBC with Differential/Platelet   Comprehensive metabolic panel   TSH   Lipid panel     Digestive   Constipation    linzess did not work for her so she stopped it (also $) Now using daily metamucil with water increase-that is helping  Will continue to follow        Nervous and Auditory   Subdural hematoma (HCC)    Doing well -now off seizure medication         Other   Underweight    Likely due to geriatric failure to thrive Will start back on amitriptyline to help appetite and sleep  Continue to follow rec 3 meal repl shakes daily in addn to meals  Declines cancer screening       Loss of weight    Suspect due to low intake and hx of always being thin to underweight Disc plan for 3 meal replacement shakes per day -in between meals and at bedtime  Disc poss of cancer causing wt loss She declines mammograms and most other cancer screening due to age  Did order cologuard-if her ins will cover over [id number] would like to try  Will continue to follow       HYPERCHOLESTEROLEMIA    Lipid panel today  Working on  getting enough calories in -more important than low fat as she is malnourished       Relevant Orders   Lipid panel

## 2015-10-02 NOTE — Assessment & Plan Note (Signed)
linzess did not work for her so she stopped it (also $) Now using daily metamucil with water increase-that is helping  Will continue to follow

## 2015-10-02 NOTE — Assessment & Plan Note (Signed)
bp in fair control at this time  BP Readings from Last 1 Encounters:  10/02/15 134/70   No changes needed Disc lifstyle change with low sodium diet and exercise  Labs today

## 2015-10-02 NOTE — Assessment & Plan Note (Signed)
Lipid panel today  Working on getting enough calories in -more important than low fat as she is malnourished

## 2015-10-02 NOTE — Assessment & Plan Note (Signed)
Suspect due to low intake and hx of always being thin to underweight Disc plan for 3 meal replacement shakes per day -in between meals and at bedtime  Disc poss of cancer causing wt loss She declines mammograms and most other cancer screening due to age  Did order cologuard-if her ins will cover over [phone] would like to try  Will continue to follow

## 2015-10-02 NOTE — Patient Instructions (Signed)
Labs today  We will enroll you in cologuard program to screen for colon cancer  Try to start increasing calories - get a meal replacement shake like boost or ensure or carnation instant breakfast  Drink one in between each meal and one at bedtime  We want to see this weight come up - you are underweight  I'm glad the constipation is improved

## 2015-10-02 NOTE — Progress Notes (Signed)
Pre visit review using our clinic review tool, if applicable. No additional management support is needed unless otherwise documented below in the visit note. 

## 2015-10-02 NOTE — Assessment & Plan Note (Signed)
Likely due to geriatric failure to thrive Will start back on amitriptyline to help appetite and sleep  Continue to follow rec 3 meal repl shakes daily in addn to meals  Declines cancer screening

## 2015-10-02 NOTE — Assessment & Plan Note (Signed)
Doing well -now off seizure medication

## 2015-10-03 LAB — COMPREHENSIVE METABOLIC PANEL
ALBUMIN: 4 g/dL (ref 3.5–5.2)
ALK PHOS: 63 U/L (ref 39–117)
ALT: 9 U/L (ref 0–35)
AST: 16 U/L (ref 0–37)
BUN: 16 mg/dL (ref 6–23)
CHLORIDE: 103 meq/L (ref 96–112)
CO2: 32 mEq/L (ref 19–32)
Calcium: 9.3 mg/dL (ref 8.4–10.5)
Creatinine, Ser: 1.12 mg/dL (ref 0.40–1.20)
GFR: 48.84 mL/min — AB (ref >60.00)
Glucose, Bld: 96 mg/dL (ref 70–99)
POTASSIUM: 4.3 meq/L (ref 3.5–5.1)
SODIUM: 140 meq/L (ref 135–145)
Total Bilirubin: 0.5 mg/dL (ref 0.2–1.2)
Total Protein: 6.7 g/dL (ref 6.0–8.3)

## 2015-10-03 LAB — CBC WITH DIFFERENTIAL/PLATELET
BASOS PCT: 0.6 % (ref 0.0–3.0)
Basophils Absolute: 0.1 10*3/uL (ref 0.0–0.1)
EOS PCT: 0.7 % (ref 0.0–5.0)
Eosinophils Absolute: 0.1 10*3/uL (ref 0.0–0.7)
HCT: 40.7 % (ref 36.0–46.0)
HEMOGLOBIN: 13.6 g/dL (ref 12.0–15.0)
LYMPHS ABS: 2.2 10*3/uL (ref 0.7–4.0)
Lymphocytes Relative: 27.5 % (ref 12.0–46.0)
MCHC: 33.3 g/dL (ref 30.0–36.0)
MCV: 89.6 fl (ref 78.0–100.0)
MONO ABS: 0.4 10*3/uL (ref 0.1–1.0)
MONOS PCT: 5.5 % (ref 3.0–12.0)
NEUTROS PCT: 65.7 % (ref 43.0–77.0)
Neutro Abs: 5.2 10*3/uL (ref 1.4–7.7)
Platelets: 291 10*3/uL (ref 150.0–400.0)
RBC: 4.54 Mil/uL (ref 3.87–5.11)
RDW: 14.1 % (ref 11.5–15.5)
WBC: 7.9 10*3/uL (ref 4.0–10.5)

## 2015-10-03 LAB — LIPID PANEL
Cholesterol: 218 mg/dL — ABNORMAL HIGH (ref 0–200)
HDL: 63.2 mg/dL (ref >39.00)
LDL Cholesterol: 135 mg/dL — ABNORMAL HIGH (ref 0–99)
NONHDL: 154.42
Total CHOL/HDL Ratio: 3
Triglycerides: 97 mg/dL (ref 0.0–149.0)
VLDL: 19.4 mg/dL (ref 0.0–40.0)

## 2015-10-03 LAB — TSH: TSH: 1.23 u[IU]/mL (ref 0.35–4.50)

## 2015-10-04 ENCOUNTER — Encounter: Payer: Self-pay | Admitting: *Deleted

## 2015-10-09 ENCOUNTER — Telehealth: Payer: Self-pay | Admitting: *Deleted

## 2015-10-09 NOTE — Telephone Encounter (Signed)
Additional info form received and filled out and faxed back to insurance

## 2015-10-09 NOTE — Telephone Encounter (Signed)
PA for amitriptyline done at [email or web address] I will await response

## 2015-10-10 NOTE — Telephone Encounter (Signed)
PA was approved and pharmacy notified, letter placed in Dr. Royden Purlower's inbox to sign

## 2016-03-15 IMAGING — CR DG HIP COMPLETE 2+V*R*
3 series · 3 of 3 positions shown · non-contrast
Comparison: 10/24/2013, 09/02/2013.

CLINICAL DATA: Hip pain.  Initial evaluation.

EXAM:
RIGHT HIP - COMPLETE 2+ VIEW

[view not recorded (1 of 3)]
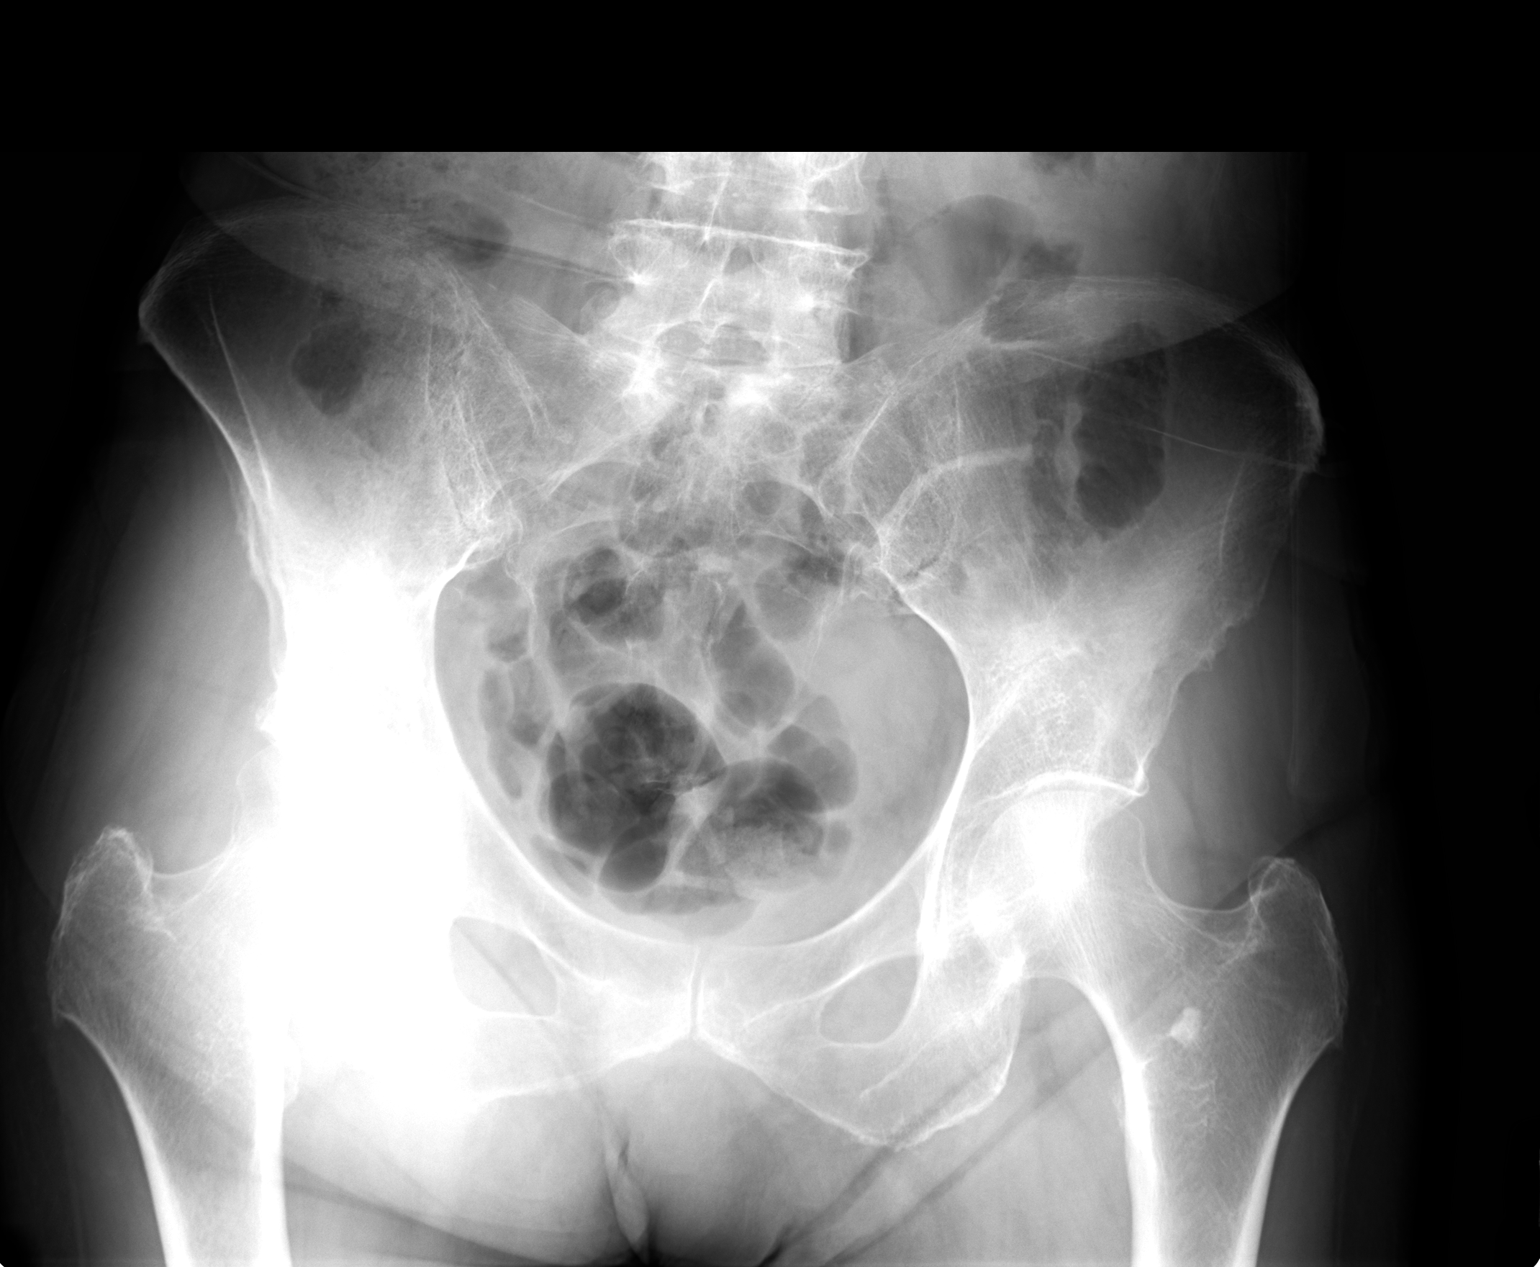

[view not recorded (2 of 3)]
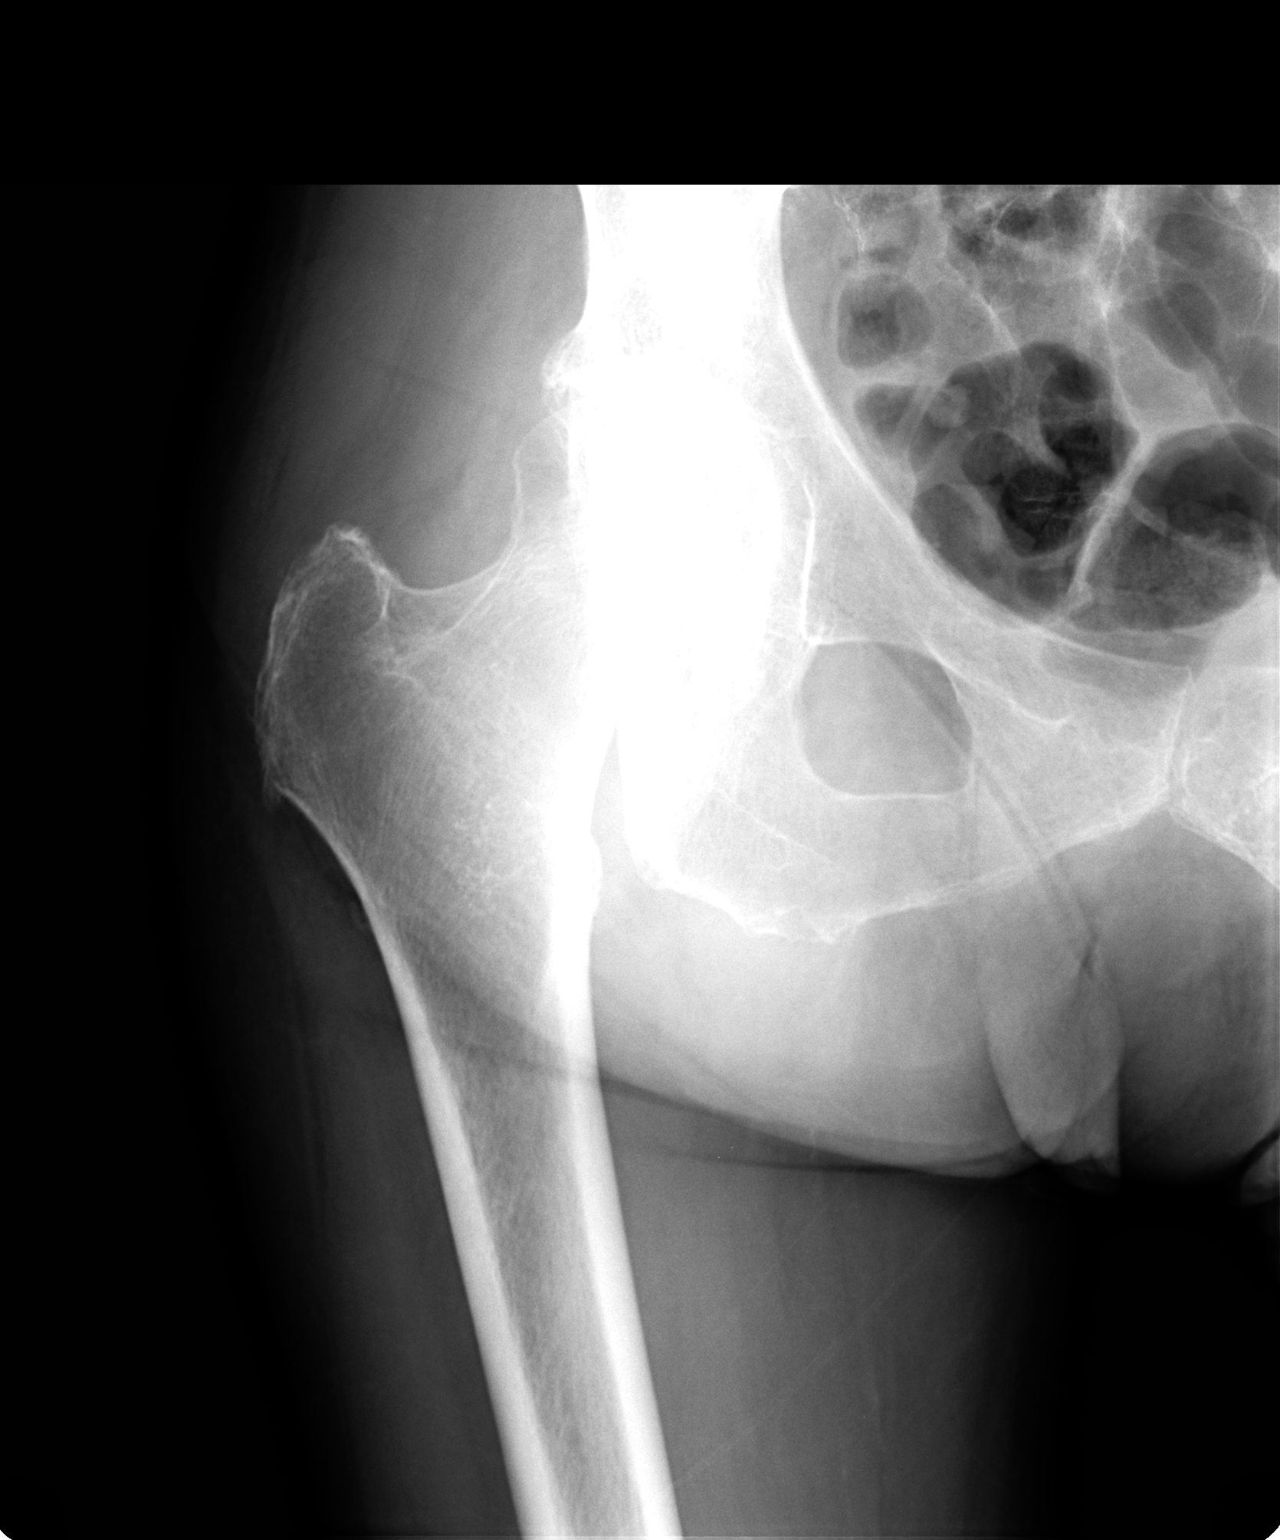

[view not recorded (3 of 3)]
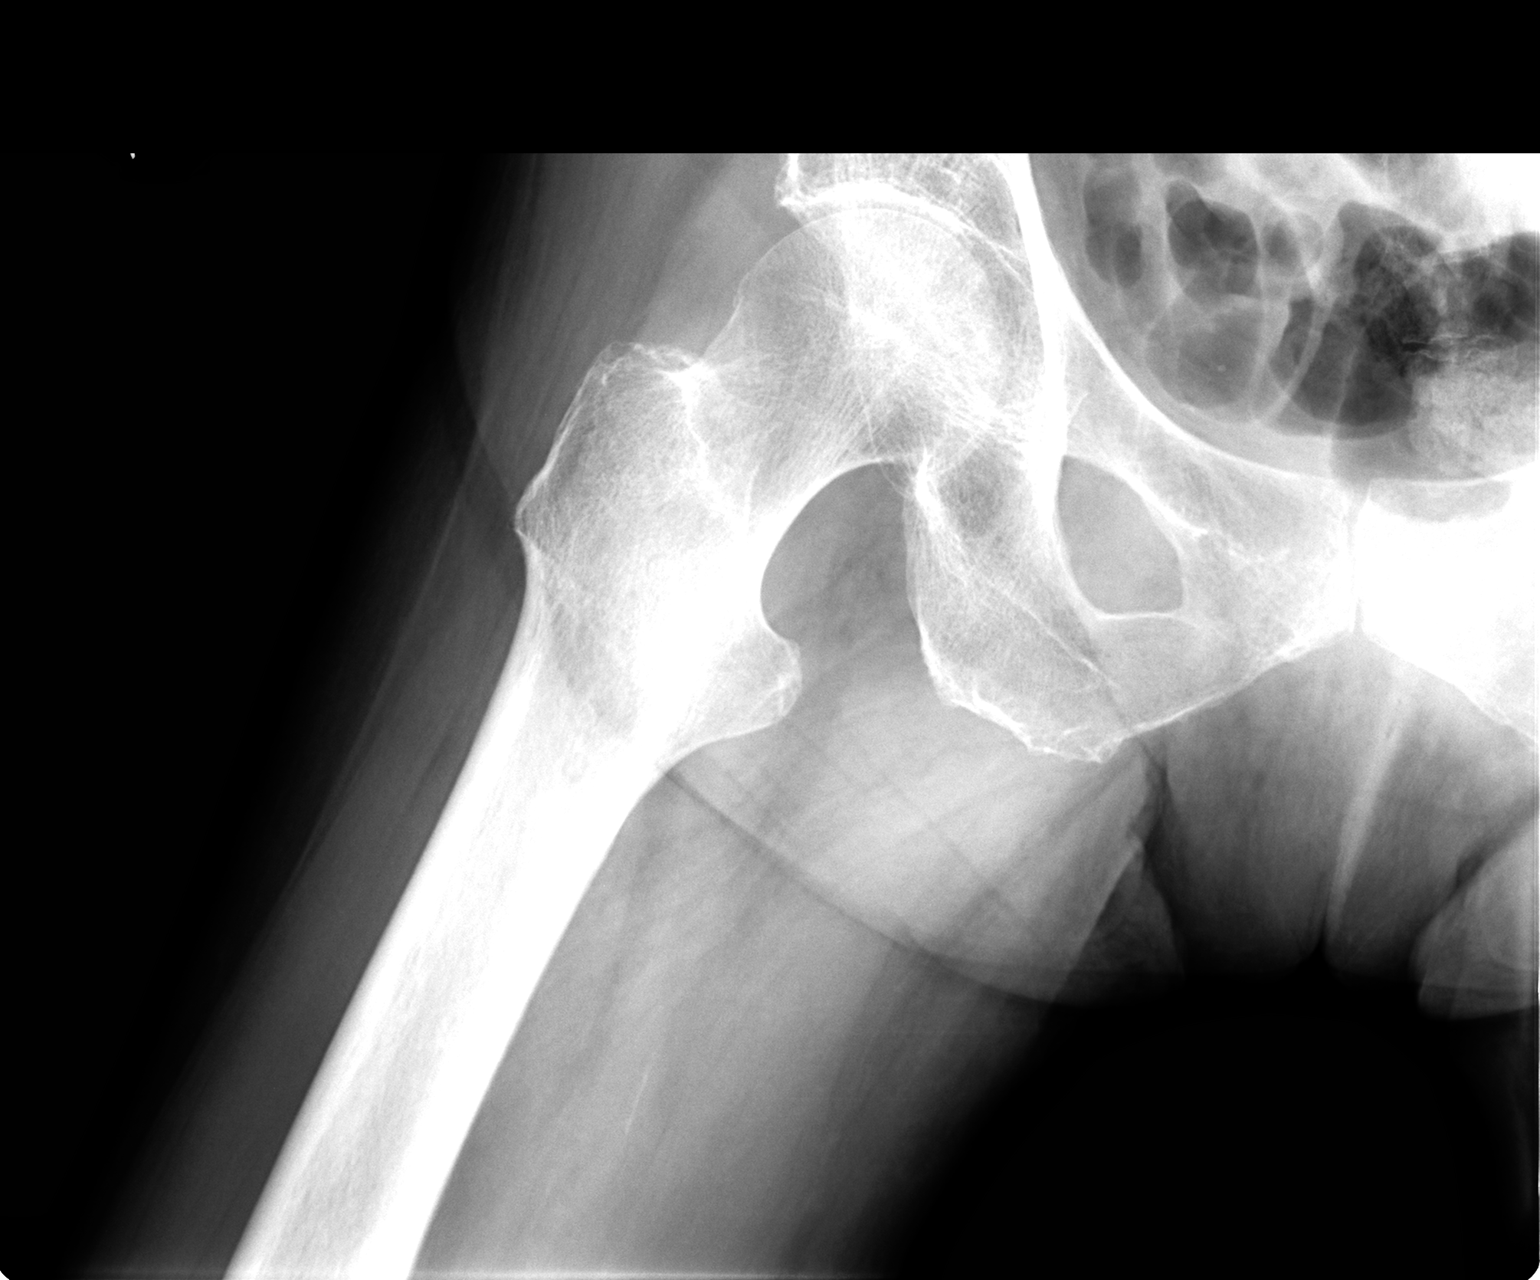

[3 of 3 positions shown; findings below may reference images not displayed]

FINDINGS: Degenerative changes lumbar spine and both hips. Tiny sclerotic
density in the left hip consistent bone island. This is unchanged.
No acute bony or joint abnormality identified.
IMPRESSION: No acute abnormality. Degenerative changes lumbar spine and both
hips.

## 2016-05-12 DIAGNOSIS — G8929 Other chronic pain: Secondary | ICD-10-CM | POA: Diagnosis not present

## 2016-05-12 DIAGNOSIS — Z79891 Long term (current) use of opiate analgesic: Secondary | ICD-10-CM | POA: Diagnosis not present

## 2016-05-12 DIAGNOSIS — M169 Osteoarthritis of hip, unspecified: Secondary | ICD-10-CM | POA: Diagnosis not present

## 2016-05-12 DIAGNOSIS — M25559 Pain in unspecified hip: Secondary | ICD-10-CM | POA: Diagnosis not present

## 2016-05-13 ENCOUNTER — Telehealth: Payer: Self-pay | Admitting: Family Medicine

## 2016-05-13 NOTE — Telephone Encounter (Signed)
Please schedule her a 30 min f/u when able Get back on hctz  thanks

## 2016-05-13 NOTE — Telephone Encounter (Signed)
Notified patient of Dr. Royden Purlower's recommendations and set patient up for a 30 min f/u to discuss on 05/20/26.  Patient verbalizes understanding.

## 2016-05-13 NOTE — Telephone Encounter (Signed)
Called to report BP 180/90 with a HR of 90 Pt had not taken Hydrochlorothiazide but asked pt to take it upon arrival to the home and bp is unchanged.  Pt has not taken med at all.   Does pt need a follow up or how would you like to proceed?  BP cuff is big and pt has small arm.  Please contact pt at [phone] to discuss.

## 2016-05-20 ENCOUNTER — Ambulatory Visit (INDEPENDENT_AMBULATORY_CARE_PROVIDER_SITE_OTHER): Payer: PPO | Admitting: Family Medicine

## 2016-05-20 ENCOUNTER — Ambulatory Visit (INDEPENDENT_AMBULATORY_CARE_PROVIDER_SITE_OTHER): Payer: PPO

## 2016-05-20 ENCOUNTER — Encounter: Payer: Self-pay | Admitting: Family Medicine

## 2016-05-20 VITALS — BP 130/80 | HR 93 | Temp 98.5°F | Ht 64.0 in | Wt 96.5 lb

## 2016-05-20 DIAGNOSIS — I10 Essential (primary) hypertension: Secondary | ICD-10-CM

## 2016-05-20 DIAGNOSIS — Z0000 Encounter for general adult medical examination without abnormal findings: Secondary | ICD-10-CM | POA: Diagnosis not present

## 2016-05-20 DIAGNOSIS — M810 Age-related osteoporosis without current pathological fracture: Secondary | ICD-10-CM | POA: Diagnosis not present

## 2016-05-20 DIAGNOSIS — E7800 Pure hypercholesterolemia, unspecified: Secondary | ICD-10-CM

## 2016-05-20 DIAGNOSIS — E559 Vitamin D deficiency, unspecified: Secondary | ICD-10-CM | POA: Diagnosis not present

## 2016-05-20 DIAGNOSIS — R636 Underweight: Secondary | ICD-10-CM

## 2016-05-20 DIAGNOSIS — H349 Unspecified retinal vascular occlusion: Secondary | ICD-10-CM

## 2016-05-20 DIAGNOSIS — M160 Bilateral primary osteoarthritis of hip: Secondary | ICD-10-CM

## 2016-05-20 MED ORDER — AMITRIPTYLINE HCL 25 MG PO TABS: 25.0000 mg | ORAL_TABLET | Freq: Every day | ORAL | 11 refills | Status: DC

## 2016-05-20 MED ORDER — HYDROCHLOROTHIAZIDE 25 MG PO TABS: 25.0000 mg | ORAL_TABLET | Freq: Every day | ORAL | 11 refills | Status: DC

## 2016-05-20 NOTE — Patient Instructions (Signed)
Victoria Ball , Thank you for taking time to come for your Medicare Wellness Visit. I appreciate your ongoing commitment to your health goals. Please review the following plan we discussed and let me know if I can assist you in the future.   These are the goals we discussed: Goals    . Increase water intake          Starting 05/21/2016, I will attempt to drink at least 6-8 glasses of water daily.        This is a list of the screening recommended for you and due dates:  Health Maintenance  Topic Date Due  . Tetanus Vaccine  08/06/2022*  . Mammogram  05/20/2025*  . Flu Shot  Addressed  . DEXA scan (bone density measurement)  Completed  . Shingles Vaccine  Completed  . Pneumonia vaccines  Completed  *Topic was postponed. The date shown is not the original due date.   Preventive Care for Adults  A healthy lifestyle and preventive care can promote health and wellness. Preventive health guidelines for adults include the following key practices.  . A routine yearly physical is a good way to check with your health care provider about your health and preventive screening. It is a chance to share any concerns and updates on your health and to receive a thorough exam.  . Visit your dentist for a routine exam and preventive care every 6 months. Brush your teeth twice a day and floss once a day. Good oral hygiene prevents tooth decay and gum disease.  . The frequency of eye exams is based on your age, health, family medical history, use  of contact lenses, and other factors. Follow your health care provider's ecommendations for frequency of eye exams.  . Eat a healthy diet. Foods like vegetables, fruits, whole grains, low-fat dairy products, and lean protein foods contain the nutrients you need without too many calories. Decrease your intake of foods high in solid fats, added sugars, and salt. Eat the right amount of calories for you. Get information about a proper diet from your health care  provider, if necessary.  . Regular physical exercise is one of the most important things you can do for your health. Most adults should get at least 150 minutes of moderate-intensity exercise (any activity that increases your heart rate and causes you to sweat) each week. In addition, most adults need muscle-strengthening exercises on 2 or more days a week.  Silver Sneakers may be a benefit available to you. To determine eligibility, you may visit the website: www.silversneakers.com or contact program at [phone] Mon-Fri between 8AM-8PM.   . Maintain a healthy weight. The body mass index (BMI) is a screening tool to identify possible weight problems. It provides an estimate of body fat based on height and weight. Your health care provider can find your BMI and can help you achieve or maintain a healthy weight.   For adults 20 years and older: ? A BMI below 18.5 is considered underweight. ? A BMI of 18.5 to 24.9 is normal. ? A BMI of 25 to 29.9 is considered overweight. ? A BMI of 30 and above is considered obese.   . Maintain normal blood lipids and cholesterol levels by exercising and minimizing your intake of saturated fat. Eat a balanced diet with plenty of fruit and vegetables. Blood tests for lipids and cholesterol should begin at age 81 and be repeated every 5 years. If your lipid or cholesterol levels are high, you are over  50, or you are at high risk for heart disease, you may need your cholesterol levels checked more frequently. Ongoing high lipid and cholesterol levels should be treated with medicines if diet and exercise are not working.  . If you smoke, find out from your health care provider how to quit. If you do not use tobacco, please do not start.  . If you choose to drink alcohol, please do not consume more than 2 drinks per day. One drink is considered to be 12 ounces (355 mL) of beer, 5 ounces (148 mL) of wine, or 1.5 ounces (44 mL) of liquor.  . If you are 46-79 years  old, ask your health care provider if you should take aspirin to prevent strokes.  . Use sunscreen. Apply sunscreen liberally and repeatedly throughout the day. You should seek shade when your shadow is shorter than you. Protect yourself by wearing long sleeves, pants, a wide-brimmed hat, and sunglasses year round, whenever you are outdoors.  . Once a month, do a whole body skin exam, using a mirror to look at the skin on your back. Tell your health care provider of new moles, moles that have irregular borders, moles that are larger than a pencil eraser, or moles that have changed in shape or color.

## 2016-05-20 NOTE — Assessment & Plan Note (Signed)
Due for check in summer  Have not limited diet due to low wt  Disc healthy protein intake

## 2016-05-20 NOTE — Assessment & Plan Note (Signed)
L eye No change in vision  bp is better controlled now

## 2016-05-20 NOTE — Progress Notes (Signed)
PCP notes:   Health maintenance:  Tetanus - postponed/insurance Mammogram - declined  Abnormal screenings:   Hearing - failed  Patient concerns:   None  Nurse concerns:  None  Next PCP appt:   05/20/16 @ 1415  I reviewed health advisor's note, was available for consultation, and agree with documentation and plan. Roxy MannsMarne Tower MD

## 2016-05-20 NOTE — Progress Notes (Signed)
Pre visit review using our clinic review tool, if applicable. No additional management support is needed unless otherwise documented below in the visit note. 

## 2016-05-20 NOTE — Patient Instructions (Addendum)
Take a look at the paper I gave you regarding tetanus shot coverage  Get back on your vitamin D - that is important for your bones  You are still underweight - do make yourself eat 3 meals a day with protein (whether you are hungry or not)  Blood pressure is much better on hctz - stay on it   Walking with a cane would help you get around better with hip pain   Follow up in 6 months for visit with labs   Take care of herself

## 2016-05-20 NOTE — Assessment & Plan Note (Signed)
Pt declines hip replacement surgery  Recommend use of cane for ambulation  She continues to go to the pain clinic

## 2016-05-20 NOTE — Progress Notes (Signed)
Subjective:   Victoria Ball is a 83 y.o. female who presents for Medicare Annual (Subsequent) preventive examination.  Review of Systems:  N/A Cardiac Risk Factors include: advanced age (>4955men, 69>65 women);dyslipidemia;hypertension     Objective:     Vitals: BP 130/80 (BP Location: Right Arm, Patient Position: Sitting, Cuff Size: Normal)   Pulse 93   Temp 98.5 F (36.9 C) (Oral)   Ht 5\' 4"  (1.626 m)   Wt 96 lb 8 oz (43.8 kg)   LMP 04/14/1977   SpO2 96%   BMI 16.56 kg/m   Body mass index is 16.56 kg/m.   Tobacco History  Smoking Status  . Passive Smoke Exposure - Never Smoker  Smokeless Tobacco  . Never Used     Counseling given: No   Past Medical History:  Diagnosis Date  . Arthritis   . History of central retinal vein occlusion   . HLD (hyperlipidemia)   . Hypertension   . OP (osteoporosis)    stopped Evista after 5 years  . Subdural hematoma, acute (HCC) 05/15/2015   left; "rolled off couch and hit head"   Past Surgical History:  Procedure Laterality Date  . ABDOMINAL HYSTERECTOMY  1994   fibroid tumor  . CATARACT EXTRACTION W/ INTRAOCULAR LENS  IMPLANT, BILATERAL Bilateral    Family History  Problem Relation Age of Onset  . Coronary artery disease Father   . Diabetes Father     ?   Marland Kitchen. Heart attack Sister [id number]  . Coronary artery disease Brother     CABG  . Coronary artery disease Brother     CABG  . Coronary artery disease Brother     CABG  . Angelman syndrome Paternal Grandfather    History  Sexual Activity  . Sexual activity: Not Currently    Outpatient Encounter Prescriptions as of 05/20/2016  Medication Sig  . amitriptyline (ELAVIL) 25 MG tablet Take 1 tablet (25 mg total) by mouth at bedtime.  . cholecalciferol (VITAMIN D) 1000 UNITS tablet Take 1,000 Units by mouth daily.    . hydrochlorothiazide (HYDRODIURIL) 25 MG tablet Take 1 tablet (25 mg total) by mouth daily.  Marland Kitchen. ketoconazole (NIZORAL) 2 % shampoo Apply 1 application topically 2  (two) times a week. Shampoo scalp twice weekly and leave on several minutes before rinsing  . oxyCODONE (ROXICODONE) 15 MG immediate release tablet Take 15 mg by mouth every 6 (six) hours as needed for pain.   Marland Kitchen. acetaminophen (TYLENOL) 325 MG tablet Take 1-2 tablets (325-650 mg total) by mouth every 6 (six) hours as needed for mild pain or headache. (Patient not taking: Reported on 05/20/2016)   No facility-administered encounter medications on file as of 05/20/2016.     Activities of Daily Living In your present state of health, do you have any difficulty performing the following activities: 05/20/2016 05/20/2016  Hearing? N N  Vision? Y Y  Difficulty concentrating or making decisions? N N  Walking or climbing stairs? Y Y  Dressing or bathing? N N  Doing errands, shopping? Y Y  Quarry managerreparing Food and eating ? - N  Using the Toilet? - N  In the past six months, have you accidently leaked urine? - N  Do you have problems with loss of bowel control? - N  Managing your Medications? - N  Managing your Finances? - N  Housekeeping or managing your Housekeeping? - N  Some recent data might be hidden    Patient Care Team: Judy PimpleMarne A Tower, MD  as PCP - General    Assessment:     Hearing Screening   125Hz  250Hz  500Hz  1000Hz  2000Hz  3000Hz  4000Hz  6000Hz  8000Hz   Right ear:   40 40 40  40    Left ear:   40 40 40  0      Visual Acuity Screening   Right eye Left eye Both eyes  Without correction: 20/30-1 20/100 20/30  With correction:       Exercise Activities and Dietary recommendations Current Exercise Habits: The patient does not participate in regular exercise at present, Exercise limited by: orthopedic condition(s)  Goals    . Increase water intake          Starting 05/21/2016, I will attempt to drink at least 6-8 glasses of water daily.       Fall Risk Fall Risk  05/20/2016 05/20/2016 01/20/2014 12/20/2012  Falls in the past year? No No No No   Depression Screen PHQ 2/9 Scores 05/20/2016 05/20/2016  01/20/2014 12/20/2012  PHQ - 2 Score 0 0 0 0     Cognitive Function MMSE - Mini Mental State Exam 05/20/2016 05/20/2016  Orientation to time 5 5  Orientation to Place 5 5  Registration 3 3  Attention/ Calculation 0 0  Recall 3 3  Language- name 2 objects 0 0  Language- repeat 1 1  Language- follow 3 step command 3 3  Language- read & follow direction 0 0  Write a sentence 0 0  Copy design 0 0  Total score 20 20       PLEASE NOTE: A Mini-Cog screen was completed. Maximum score is 20. A value of 0 denotes this part of Folstein MMSE was not completed or the patient failed this part of the Mini-Cog screening.   Mini-Cog Screening Orientation to Time - Max 5 pts Orientation to Place - Max 5 pts Registration - Max 3 pts Recall - Max 3 pts Language Repeat - Max 1 pts Language Follow 3 Step Command - Max 3 pts   Immunization History  Administered Date(s) Administered  . Influenza Whole 02/24/2006, 02/07/2008  . Influenza, High Dose Seasonal PF 01/03/2016  . Influenza,inj,Quad PF,36+ Mos 12/20/2012, 01/20/2014, 12/20/2014  . Pneumococcal Conjugate-13 08/01/2015  . Pneumococcal Polysaccharide-23 07/18/2002  . Td 07/18/2002  . Zoster 04/24/2008   Screening Tests Health Maintenance  Topic Date Due  . TETANUS/TDAP  08/06/2022 (Originally 07/17/2012)  . MAMMOGRAM  05/20/2025 (Originally 11/19/2012)  . INFLUENZA VACCINE  Addressed  . DEXA SCAN  Completed  . ZOSTAVAX  Completed  . PNA vac Low Risk Adult  Completed      Plan:     I have personally reviewed and addressed the Medicare Annual Wellness questionnaire and have noted the following in the patient's chart:  A. Medical and social history B. Use of alcohol, tobacco or illicit drugs  C. Current medications and supplements D. Functional ability and status E.  Nutritional status F.  Physical activity G. Advance directives H. List of other physicians I.  Hospitalizations, surgeries, and ER visits in previous 12 months J.   Vitals K. Screenings to include hearing, vision, cognitive, depression L. Referrals and appointments - none  In addition, I have reviewed and discussed with patient certain preventive protocols, quality metrics, and best practice recommendations. A written personalized care plan for preventive services as well as general preventive health recommendations were provided to patient.  See attached scanned questionnaire for additional information.   Signed,   Randa Evens, MHA, BS, LPN [facility]

## 2016-05-20 NOTE — Assessment & Plan Note (Signed)
Pt has not been compliant with dosing Disc imp to bone and overall health  Enc use of a pill box  2000 iu daily or more recommended Will get level with next lab in 6 mo

## 2016-05-20 NOTE — Progress Notes (Signed)
Subjective:    Patient ID: Victoria Ball, female    DOB: 01-May-1925, 83 y.o.   MRN: [id number]  HPI  Here for f/u of chronic health problems   Rev AMW visit with Virl AxeLesia earlier today   Vision stable 20/100 in L eye baseline but 20/30 bilat   (hx of retinal vein occlusion) Hearing is unchanged  Declines much health mt incl mammogram  Not utd tetanus shot (2004) Has had a hysterectomy for fibroids in the past   dexa 9/14-took evista for 5 y Declines further dexa No falls or fx  Has not taken her vitamin D lately   No gyn symptoms   utd other imms   Wt Readings from Last 3 Encounters:  05/20/16 96 lb 8 oz (43.8 kg)  05/20/16 96 lb 8 oz (43.8 kg)  10/02/15 97 lb 8 oz (44.2 kg)   bmi is low as usual at 16.5 Still underweight  She not a big eater  She can make herself eat meals even if not hungry    Some BP concerns  She called on 1/30 to report her bp was 180/90 with pulse of 90 but had not taken hctz She had not taken med for a while - now back on it daily  Does not know why she quit    bp is stable today / much better  No cp or palpitations or headaches or edema  No side effects to medicines  BP Readings from Last 3 Encounters:  05/20/16 130/80  05/20/16 130/80  10/02/15 134/70    Takes hctz 25 mg     Chemistry      Component Value Date/Time   NA 140 10/02/2015 1553   K 4.3 10/02/2015 1553   CL 103 10/02/2015 1553   CO2 32 10/02/2015 1553   BUN 16 10/02/2015 1553   CREATININE 1.12 10/02/2015 1553      Component Value Date/Time   CALCIUM 9.3 10/02/2015 1553   ALKPHOS 63 10/02/2015 1553   AST 16 10/02/2015 1553   ALT 9 10/02/2015 1553   BILITOT 0.5 10/02/2015 1553     has worked on drinking enough water    Hx of subdural hematoma in the past   Hx of hyperlipidemia Lab Results  Component Value Date   CHOL 218 (H) 10/02/2015   HDL 63.20 10/02/2015   LDLCALC 135 (H) 10/02/2015   LDLDIRECT 118.8 12/14/2012   TRIG 97.0 10/02/2015   CHOLHDL 3  10/02/2015   Afraid to restrict diet due to low weight   Hip is about the same -no changes  Limits her activity  Does not use a cane but should  Still goes to the pain clinic for oxycodone  Declines hip replacement    Patient Active Problem List   Diagnosis Date Noted  . Routine general medical examination at a health care facility 05/20/2016  . Seborrheic dermatitis of scalp 08/01/2015  . History of subdural hematoma 05/15/2015  . Underweight 03/21/2015  . Constipation 03/21/2015  . Elevated serum creatinine 03/21/2015  . Insomnia 08/08/2014  . Loss of weight 03/28/2014  . Leukocytosis 03/28/2014  . Osteoarthritis of both hips 09/26/2013  . Right hip pain 09/02/2013  . Encounter for Medicare annual wellness exam 12/20/2012  . History of central retinal vein occlusion   . HYPERTENSION, BENIGN ESSENTIAL 03/27/2009  . BACK PAIN 08/31/2008  . Vitamin D deficiency 02/07/2008  . HYPERCHOLESTEROLEMIA 01/27/2007  . Retinal vascular occlusion 01/27/2007  . HIP PAIN, RIGHT, CHRONIC 01/27/2007  .  Osteoporosis 01/27/2007   Past Medical History:  Diagnosis Date  . Arthritis   . History of central retinal vein occlusion   . HLD (hyperlipidemia)   . Hypertension   . OP (osteoporosis)    stopped Evista after 5 years  . Subdural hematoma, acute (HCC) 05/15/2015   left; "rolled off couch and hit head"   Past Surgical History:  Procedure Laterality Date  . ABDOMINAL HYSTERECTOMY  1994   fibroid tumor  . CATARACT EXTRACTION W/ INTRAOCULAR LENS  IMPLANT, BILATERAL Bilateral    Social History  Substance Use Topics  . Smoking status: Passive Smoke Exposure - Never Smoker  . Smokeless tobacco: Never Used  . Alcohol use No   Family History  Problem Relation Age of Onset  . Coronary artery disease Father   . Diabetes Father     ?   Marland Kitchen Heart attack Sister 60  . Coronary artery disease Brother     CABG  . Coronary artery disease Brother     CABG  . Coronary artery disease  Brother     CABG  . Angelman syndrome Paternal Grandfather    No Known Allergies Current Outpatient Prescriptions on File Prior to Visit  Medication Sig Dispense Refill  . cholecalciferol (VITAMIN D) 1000 UNITS tablet Take 1,000 Units by mouth daily.      Marland Kitchen ketoconazole (NIZORAL) 2 % shampoo Apply 1 application topically 2 (two) times a week. Shampoo scalp twice weekly and leave on several minutes before rinsing 120 mL 3  . oxyCODONE (ROXICODONE) 15 MG immediate release tablet Take 15 mg by mouth every 6 (six) hours as needed for pain.   0  . acetaminophen (TYLENOL) 325 MG tablet Take 1-2 tablets (325-650 mg total) by mouth every 6 (six) hours as needed for mild pain or headache. (Patient not taking: Reported on 05/20/2016)     No current facility-administered medications on file prior to visit.     Review of Systems Review of Systems  Constitutional: Negative for fever, appetite change,  and unexpected weight change.  Eyes: Negative for pain and visual disturbance.  Respiratory: Negative for cough and shortness of breath.   Cardiovascular: Negative for cp or palpitations    Gastrointestinal: Negative for nausea, diarrhea and constipation.  Genitourinary: Negative for urgency and frequency.  Skin: Negative for pallor or rash   MSK pos for bilat hip pain with mobility impairment  Neurological: Negative for weakness, light-headedness, numbness and headaches.  Hematological: Negative for adenopathy. Does not bruise/bleed easily.  Psychiatric/Behavioral: Negative for dysphoric mood. The patient is not nervous/anxious.         Objective:   Physical Exam  Constitutional: She appears well-developed and well-nourished. No distress.  Frail appearing underwt elderly female   HENT:  Head: Normocephalic and atraumatic.  Right Ear: External ear normal.  Left Ear: External ear normal.  Nose: Nose normal.  Mouth/Throat: Oropharynx is clear and moist.  Nares are boggy  Eyes: Conjunctivae and  EOM are normal. Pupils are equal, round, and reactive to light. Right eye exhibits no discharge. Left eye exhibits no discharge. No scleral icterus.  Neck: Normal range of motion. Neck supple. No JVD present. Carotid bruit is not present. No thyromegaly present.  Cardiovascular: Normal rate, regular rhythm, normal heart sounds and intact distal pulses.  Exam reveals no gallop.   Pulmonary/Chest: Effort normal and breath sounds normal. No respiratory distress. She has no wheezes. She has no rales.  Abdominal: Soft. Bowel sounds are normal. She exhibits no  distension and no mass. There is no tenderness.  Musculoskeletal: She exhibits no edema or tenderness.  Kyphosis noted   Poor rom hips and affected gait/ mobility imp from her hip oa   Lymphadenopathy:    She has no cervical adenopathy.  Neurological: She is alert. She has normal reflexes. No cranial nerve deficit. She exhibits normal muscle tone. Coordination normal.  Skin: Skin is warm and dry. No rash noted. No erythema. No pallor.  Some lentigines and sks  Psychiatric: She has a normal mood and affect.  Guarded affect somewhat-not unusual for her           Assessment & Plan:   Problem List Items Addressed This Visit      Cardiovascular and Mediastinum   HYPERTENSION, BENIGN ESSENTIAL    Now she is back on hctz and bp is much improved   bp in fair control at this time  BP Readings from Last 1 Encounters:  05/20/16 130/80   No changes needed Disc lifstyle change with low sodium diet and exercise   Will check lab at f/u in summer  With better water intake her cr was improved on last check       Relevant Medications   hydrochlorothiazide (HYDRODIURIL) 25 MG tablet   Retinal vascular occlusion    L eye No change in vision  bp is better controlled now       Relevant Medications   hydrochlorothiazide (HYDRODIURIL) 25 MG tablet     Musculoskeletal and Integument   Osteoarthritis of both hips    Pt declines hip  replacement surgery  Recommend use of cane for ambulation  She continues to go to the pain clinic       Osteoporosis - Primary    Declines further dexa 5 y of evista in the past  Urged her to be more compliant with vit D and proper nutrition  No falls or fractures           Other   HYPERCHOLESTEROLEMIA    Due for check in summer  Have not limited diet due to low wt  Disc healthy protein intake      Relevant Medications   hydrochlorothiazide (HYDRODIURIL) 25 MG tablet   Underweight    bmi is 16.5 Again stressed importance of 3 meals per day with protein and she thinks she can manage this  Lives with daughter who can help with meals       Vitamin D deficiency    Pt has not been compliant with dosing Disc imp to bone and overall health  Enc use of a pill box  2000 iu daily or more recommended Will get level with next lab in 6 mo

## 2016-05-20 NOTE — Assessment & Plan Note (Signed)
Declines further dexa 5 y of evista in the past  Urged her to be more compliant with vit D and proper nutrition  No falls or fractures

## 2016-05-20 NOTE — Assessment & Plan Note (Signed)
Now she is back on hctz and bp is much improved   bp in fair control at this time  BP Readings from Last 1 Encounters:  05/20/16 130/80   No changes needed Disc lifstyle change with low sodium diet and exercise   Will check lab at f/u in summer  With better water intake her cr was improved on last check

## 2016-05-21 NOTE — Assessment & Plan Note (Signed)
bmi is 16.5 Again stressed importance of 3 meals per day with protein and she thinks she can manage this  Lives with daughter who can help with meals

## 2016-05-29 ENCOUNTER — Telehealth: Payer: Self-pay | Admitting: Family Medicine

## 2016-05-29 NOTE — Telephone Encounter (Signed)
Pt called wanting to know if you would fill out a handicap placard form.  DOT told her we had the forms

## 2016-05-29 NOTE — Telephone Encounter (Signed)
Yes- but I cannot do it until I'm back tomorrow

## 2016-05-30 NOTE — Telephone Encounter (Signed)
Pt notified form ready for pick up 

## 2016-05-30 NOTE — Telephone Encounter (Signed)
Form in your inbox 

## 2016-05-30 NOTE — Telephone Encounter (Signed)
Done and in IN box 

## 2016-07-08 DIAGNOSIS — M159 Polyosteoarthritis, unspecified: Secondary | ICD-10-CM | POA: Diagnosis not present

## 2016-07-08 DIAGNOSIS — G8929 Other chronic pain: Secondary | ICD-10-CM | POA: Diagnosis not present

## 2016-07-08 DIAGNOSIS — Z79891 Long term (current) use of opiate analgesic: Secondary | ICD-10-CM | POA: Diagnosis not present

## 2016-07-08 DIAGNOSIS — M47817 Spondylosis without myelopathy or radiculopathy, lumbosacral region: Secondary | ICD-10-CM | POA: Diagnosis not present

## 2016-09-16 DIAGNOSIS — Z79891 Long term (current) use of opiate analgesic: Secondary | ICD-10-CM | POA: Diagnosis not present

## 2016-09-16 DIAGNOSIS — M159 Polyosteoarthritis, unspecified: Secondary | ICD-10-CM | POA: Diagnosis not present

## 2016-09-16 DIAGNOSIS — G8929 Other chronic pain: Secondary | ICD-10-CM | POA: Diagnosis not present

## 2016-09-16 DIAGNOSIS — M47817 Spondylosis without myelopathy or radiculopathy, lumbosacral region: Secondary | ICD-10-CM | POA: Diagnosis not present

## 2016-09-16 DIAGNOSIS — M169 Osteoarthritis of hip, unspecified: Secondary | ICD-10-CM | POA: Diagnosis not present

## 2016-10-23 ENCOUNTER — Encounter (HOSPITAL_COMMUNITY): Payer: Self-pay

## 2016-10-23 ENCOUNTER — Emergency Department (HOSPITAL_COMMUNITY)
Admission: EM | Admit: 2016-10-23 | Discharge: 2016-10-23 | Disposition: A | Discharge status: Home / Self Care | Payer: PPO | Attending: Emergency Medicine | Admitting: Emergency Medicine

## 2016-10-23 ENCOUNTER — Telehealth: Payer: Self-pay | Admitting: Family Medicine

## 2016-10-23 ENCOUNTER — Emergency Department (HOSPITAL_COMMUNITY): Payer: PPO

## 2016-10-23 DIAGNOSIS — G8929 Other chronic pain: Secondary | ICD-10-CM | POA: Diagnosis not present

## 2016-10-23 DIAGNOSIS — Z79899 Other long term (current) drug therapy: Secondary | ICD-10-CM | POA: Diagnosis not present

## 2016-10-23 DIAGNOSIS — Z7722 Contact with and (suspected) exposure to environmental tobacco smoke (acute) (chronic): Secondary | ICD-10-CM | POA: Diagnosis not present

## 2016-10-23 DIAGNOSIS — M25551 Pain in right hip: Secondary | ICD-10-CM | POA: Insufficient documentation

## 2016-10-23 DIAGNOSIS — I10 Essential (primary) hypertension: Secondary | ICD-10-CM | POA: Insufficient documentation

## 2016-10-23 DIAGNOSIS — M1611 Unilateral primary osteoarthritis, right hip: Secondary | ICD-10-CM | POA: Diagnosis not present

## 2016-10-23 DIAGNOSIS — F1193 Opioid use, unspecified with withdrawal: Secondary | ICD-10-CM

## 2016-10-23 DIAGNOSIS — F1123 Opioid dependence with withdrawal: Secondary | ICD-10-CM | POA: Diagnosis not present

## 2016-10-23 DIAGNOSIS — R52 Pain, unspecified: Secondary | ICD-10-CM | POA: Diagnosis not present

## 2016-10-23 LAB — RAPID URINE DRUG SCREEN, HOSP PERFORMED
AMPHETAMINES: NOT DETECTED
BENZODIAZEPINES: NOT DETECTED
Barbiturates: NOT DETECTED
COCAINE: NOT DETECTED
OPIATES: NOT DETECTED
TETRAHYDROCANNABINOL: NOT DETECTED

## 2016-10-23 LAB — CBC WITH DIFFERENTIAL/PLATELET
BASOS PCT: 0 %
Basophils Absolute: 0 10*3/uL (ref 0.0–0.1)
EOS ABS: 0 10*3/uL (ref 0.0–0.7)
EOS PCT: 0 %
HCT: 37.5 % (ref 36.0–46.0)
Hemoglobin: 12.6 g/dL (ref 12.0–15.0)
Lymphocytes Relative: 21 %
Lymphs Abs: 1.7 10*3/uL (ref 0.7–4.0)
MCH: 30.2 pg (ref 26.0–34.0)
MCHC: 33.6 g/dL (ref 30.0–36.0)
MCV: 89.9 fL (ref 78.0–100.0)
MONO ABS: 0.5 10*3/uL (ref 0.1–1.0)
MONOS PCT: 6 %
Neutro Abs: 5.7 10*3/uL (ref 1.7–7.7)
Neutrophils Relative %: 73 %
PLATELETS: 264 10*3/uL (ref 150–400)
RBC: 4.17 MIL/uL (ref 3.87–5.11)
RDW: 12.6 % (ref 11.5–15.5)
WBC: 7.9 10*3/uL (ref 4.0–10.5)

## 2016-10-23 LAB — COMPREHENSIVE METABOLIC PANEL
ALBUMIN: 3.6 g/dL (ref 3.5–5.0)
ALT: 11 U/L — ABNORMAL LOW (ref 14–54)
ANION GAP: 7 (ref 5–15)
AST: 16 U/L (ref 15–41)
Alkaline Phosphatase: 68 U/L (ref 38–126)
BUN: 16 mg/dL (ref 6–20)
CHLORIDE: 96 mmol/L — AB (ref 101–111)
CO2: 26 mmol/L (ref 22–32)
Calcium: 8.6 mg/dL — ABNORMAL LOW (ref 8.9–10.3)
Creatinine, Ser: 1.45 mg/dL — ABNORMAL HIGH (ref 0.44–1.00)
GFR calc non Af Amer: 31 mL/min — ABNORMAL LOW (ref >60)
GFR, EST AFRICAN AMERICAN: 36 mL/min — AB (ref >60)
GLUCOSE: 95 mg/dL (ref 65–99)
Potassium: 4.2 mmol/L (ref 3.5–5.1)
SODIUM: 129 mmol/L — AB (ref 135–145)
Total Bilirubin: 0.8 mg/dL (ref 0.3–1.2)
Total Protein: 6.1 g/dL — ABNORMAL LOW (ref 6.5–8.1)

## 2016-10-23 LAB — ETHANOL: Alcohol, Ethyl (B): <5 mg/dL (ref <5)

## 2016-10-23 MED ORDER — OXYCODONE-ACETAMINOPHEN 5-325 MG PO TABS
2.0000 | ORAL_TABLET | Freq: Once | ORAL | Status: AC
  Administered 2016-10-23: 2 via ORAL
  Filled 2016-10-23: qty 2

## 2016-10-23 MED ORDER — SODIUM CHLORIDE 0.9 % IV BOLUS (SEPSIS)
500.0000 mL | Freq: Once | INTRAVENOUS | Status: AC
  Administered 2016-10-23: 500 mL via INTRAVENOUS

## 2016-10-23 MED ORDER — LORAZEPAM 2 MG/ML IJ SOLN: 0.5000 mg | Freq: Once | INTRAMUSCULAR | Status: DC

## 2016-10-23 MED ORDER — ACETAMINOPHEN 325 MG PO TABS
650.0000 mg | ORAL_TABLET | Freq: Once | ORAL | Status: AC
  Administered 2016-10-23: 650 mg via ORAL
  Filled 2016-10-23: qty 2

## 2016-10-23 MED ORDER — IBUPROFEN 400 MG PO TABS
400.0000 mg | ORAL_TABLET | Freq: Once | ORAL | Status: AC
  Administered 2016-10-23: 400 mg via ORAL
  Filled 2016-10-23: qty 1

## 2016-10-23 MED ORDER — OXYCODONE-ACETAMINOPHEN 5-325 MG PO TABS: 2.0000 | ORAL_TABLET | ORAL | 0 refills | Status: DC | PRN

## 2016-10-23 NOTE — Telephone Encounter (Signed)
Pt called to request a referral for arthritis. [place] and she needs a new  Dr. [facility] [place] [phone]

## 2016-10-23 NOTE — ED Triage Notes (Signed)
Pt brought in by EMS due to having chronic hip/leg pain that gotten worse since yesterday. Pt was being managed at pain clinic and clinic closed down unexpectedly. Pt attempted to get in other places, but there is a month wait. Pt did received of fentanyl then refused the rest of the dose. Pt a&ox4.

## 2016-10-23 NOTE — Discharge Instructions (Signed)
Please follow up with pain management asap.  Return if you wish assistance with narcotic withdrawal.

## 2016-10-23 NOTE — ED Notes (Signed)
Pt refusing to change out of gown at this time.

## 2016-10-23 NOTE — ED Notes (Signed)
Pt refused po fluid, glass of water left beside.

## 2016-10-23 NOTE — Telephone Encounter (Signed)
Left voicemail requesting pt to call the office back 

## 2016-10-23 NOTE — Telephone Encounter (Signed)
I looked him up and I think he is a neurologist/not orthopedic  Who was she seeing prior?

## 2016-10-23 NOTE — ED Notes (Signed)
Asked pt to please provide urine sample. Pt states she cannot go at this time.

## 2016-10-23 NOTE — ED Notes (Signed)
Got patient vitals on patient patient is resting waiting provider

## 2016-10-23 NOTE — ED Provider Notes (Signed)
[facility]-EM[facility]ENCY DEPT Provider Note   CSN: [id number] Arrival date & time: 10/23/16  1638     History   Chief Complaint Chief Complaint  Patient presents with  . Hip Pain    HPI Victoria Ball is a 83 y.o. female.  HPI  83 year old female with a history of chronic hip pain. She states that she has been being treated at a pain clinic with oxycodone 15 mg 4 times a day. The pain clinic closed and she has been out of her oxycodone since yesterday. She is complaining of right hip pain. She states that the pain is severe and nothing else works for it besides the oxycodone. She denies nausea, vomiting, or diarrhea. She was given fentanyl and route by EMS. She states that she came by EMS because she did not feel like driving. She states that she was third daughter. She has primary care at [facility]. She states Dr. Milinda Antis has told her that she could not give her narcotic medicine. She has an appointment with a new pain clinic but is unclear as to when she'll be seen by them. She has been eating and drinking but has had some decreased intake today. Past Medical History:  Diagnosis Date  . Arthritis   . History of central retinal vein occlusion   . HLD (hyperlipidemia)   . Hypertension   . OP (osteoporosis)    stopped Evista after 5 years  . Subdural hematoma, acute (HCC) 05/15/2015   left; "rolled off couch and hit head"    Patient Active Problem List   Diagnosis Date Noted  . Routine general medical examination at a health care facility 05/20/2016  . Seborrheic dermatitis of scalp 08/01/2015  . History of subdural hematoma 05/15/2015  . Underweight 03/21/2015  . Constipation 03/21/2015  . Elevated serum creatinine 03/21/2015  . Insomnia 08/08/2014  . Loss of weight 03/28/2014  . Leukocytosis 03/28/2014  . Osteoarthritis of both hips 09/26/2013  . Right hip pain 09/02/2013  . Encounter for Medicare annual wellness exam 12/20/2012  . History of central retinal vein occlusion   .  HYPERTENSION, BENIGN ESSENTIAL 03/27/2009  . BACK PAIN 08/31/2008  . Vitamin D deficiency 02/07/2008  . HYPERCHOLESTEROLEMIA 01/27/2007  . Retinal vascular occlusion 01/27/2007  . HIP PAIN, RIGHT, CHRONIC 01/27/2007  . Osteoporosis 01/27/2007    Past Surgical History:  Procedure Laterality Date  . ABDOMINAL HYSTERECTOMY  1994   fibroid tumor  . CATARACT EXTRACTION W/ INTRAOCULAR LENS  IMPLANT, BILATERAL Bilateral     OB History    No data available       Home Medications    Prior to Admission medications   Medication Sig Start Date End Date Taking? Authorizing Provider  amitriptyline (ELAVIL) 25 MG tablet Take 1 tablet (25 mg total) by mouth at bedtime. 05/20/16   Tower, Audrie Gallus, MD  cholecalciferol (VITAMIN D) 1000 UNITS tablet Take 1,000 Units by mouth daily.      [provider]  hydrochlorothiazide (HYDRODIURIL) 25 MG tablet Take 1 tablet (25 mg total) by mouth daily. 05/20/16   Tower, Audrie Gallus, MD  ketoconazole (NIZORAL) 2 % shampoo Apply 1 application topically 2 (two) times a week. Shampoo scalp twice weekly and leave on several minutes before rinsing 08/01/15   Tower, Idamae Schuller A, MD  oxyCODONE (ROXICODONE) 15 MG immediate release tablet Take 15 mg by mouth every 6 (six) hours as needed for pain.  03/01/15   [provider]    Family History Family History  Problem Relation Age of Onset  . Coronary artery disease Father   . Diabetes Father        ?   Marland Kitchen Heart attack Sister 68  . Coronary artery disease Brother        CABG  . Coronary artery disease Brother        CABG  . Coronary artery disease Brother        CABG  . Angelman syndrome Paternal Grandfather     Social History Social History  Substance Use Topics  . Smoking status: Passive Smoke Exposure - Never Smoker  . Smokeless tobacco: Never Used  . Alcohol use No     Allergies   Patient has no known allergies.   Review of Systems Review of Systems  All other systems reviewed and are  negative.    Physical Exam Updated Vital Signs BP (!) 155/95   Pulse 88   Temp 98.6 F (37 C) (Oral)   Resp 15   LMP 04/14/1977   SpO2 100%   Physical Exam  Constitutional: She is oriented to person, place, and time. She appears well-developed and well-nourished.  HENT:  Head: Normocephalic and atraumatic.  Eyes: Pupils are equal, round, and reactive to light.  Neck: Normal range of motion.  Cardiovascular: Normal rate.   Pulmonary/Chest: Effort normal.  Abdominal: Soft.  Musculoskeletal: Normal range of motion.  Full active range of motion of bilateral hips. No point tenderness is noted. No trauma to the skin, erythema, swelling, or warmth noted.  Neurological: She is alert and oriented to person, place, and time.  Skin: Skin is warm.  Psychiatric: She has a normal mood and affect.  Nursing note and vitals reviewed.    ED Treatments / Results  Labs (all labs ordered are listed, but only abnormal results are displayed) Labs Reviewed  COMPREHENSIVE METABOLIC PANEL - Abnormal; Notable for the following:       Result Value   Sodium 129 (*)    Chloride 96 (*)    Creatinine, Ser 1.45 (*)    Calcium 8.6 (*)    Total Protein 6.1 (*)    ALT 11 (*)    GFR calc non Af Amer 31 (*)    GFR calc Af Amer 36 (*)    All other components within normal limits  ETHANOL  CBC WITH DIFFERENTIAL/PLATELET  RAPID URINE DRUG SCREEN, HOSP PERFORMED    EKG  EKG Interpretation None       Radiology Dg Hip Unilat W Or Wo Pelvis 2-3 Views Right  Result Date: 10/23/2016 CLINICAL DATA:  Right hip pain. EXAM: DG HIP (WITH OR WITHOUT PELVIS) 2-3V RIGHT COMPARISON:  Radiographs 08/01/2014, additional priors FINDINGS: The cortical margins of the bony pelvis and right hip are intact. No fracture. Pubic symphysis and sacroiliac joints are congruent. Both femoral heads are well-seated in the respective acetabula. Mild age-related osteoarthritis of both hips. Diffuse bony under mineralization.  Bone island in the left hip is stable. IMPRESSION: No evidence of pelvis or right hip fracture. Bony under mineralization and mild degenerative change, similar to prior exam. Electronically Signed   By: Rubye Oaks M.D.   On: 10/23/2016 19:30    Procedures Procedures (including critical care time)  Medications Ordered in ED Medications  sodium chloride 0.9 % bolus 500 mL (500 mLs Intravenous New Bag/Given 10/23/16 1946)  oxyCODONE-acetaminophen (PERCOCET/ROXICET) 5-325 MG per tablet 2 tablet (not administered)  acetaminophen (TYLENOL) tablet 650 mg (650 mg Oral Given 10/23/16 1757)  ibuprofen (ADVIL,MOTRIN)  tablet 400 mg (400 mg Oral Given 10/23/16 1757)     Initial Impression / Assessment and Plan / ED Course  I have reviewed the triage vital signs and the nursing notes.  Pertinent labs & imaging results that were available during my care of the patient were reviewed by me and considered in my medical decision making (see chart for details).     Patient given Tylenol and ibuprofen here. She is given 500 mL of normal saline. She is taking by mouth. She is moving her extremities well. She is given 10 mg of oxycodone here. I discussed alternative therapies. She states that the Tylenol and ibuprofen do not work for her and that nothing works besides the oxycodone. She feels that she will be able to get into pain clinic possibly tomorrow. I have given her instructions regarding narcotics withdrawal. She does not wish to seek any further treatment at this time. She appears stable for discharge. She is given prescription for 10 percocet 5mg   Final Clinical Impressions(s) / ED Diagnoses   Final diagnoses:  Chronic right hip pain  Narcotic withdrawal [facility](HCC)    New Prescriptions New Prescriptions   No medications on file     Margarita Grizzleay, Shayden Bobier, MD 10/23/16 2009

## 2016-10-27 ENCOUNTER — Encounter: Payer: Self-pay | Admitting: Family Medicine

## 2016-10-27 ENCOUNTER — Ambulatory Visit (INDEPENDENT_AMBULATORY_CARE_PROVIDER_SITE_OTHER): Payer: PPO | Admitting: Family Medicine

## 2016-10-27 VITALS — BP 148/76 | HR 103 | Temp 98.3°F | Ht 64.0 in | Wt 95.2 lb

## 2016-10-27 DIAGNOSIS — M25551 Pain in right hip: Secondary | ICD-10-CM

## 2016-10-27 DIAGNOSIS — R7989 Other specified abnormal findings of blood chemistry: Secondary | ICD-10-CM

## 2016-10-27 DIAGNOSIS — Z79891 Long term (current) use of opiate analgesic: Secondary | ICD-10-CM | POA: Diagnosis not present

## 2016-10-27 DIAGNOSIS — I10 Essential (primary) hypertension: Secondary | ICD-10-CM

## 2016-10-27 MED ORDER — AMLODIPINE BESYLATE 5 MG PO TABS: 5.0000 mg | ORAL_TABLET | Freq: Every day | ORAL | 11 refills | Status: DC

## 2016-10-27 MED ORDER — OXYCODONE-ACETAMINOPHEN 5-325 MG PO TABS: 2.0000 | ORAL_TABLET | ORAL | 0 refills | Status: DC | PRN

## 2016-10-27 NOTE — Patient Instructions (Signed)
Sign the narcotics contract please and we will do the urine drug screen  We will refer you to Dr Ollen BowlHarkins   Here is a month supply ot the oxycodone 5-325  I will px this until you get in with a new pain doctor  Stop your hctz - it is not good for your kidneys / and causing sodium level to be low  Start amlodipine instead for high blood pressure 1 pill daily   Follow up in 2-3 weeks for blood pressure

## 2016-10-27 NOTE — Telephone Encounter (Signed)
Pt came in to office requesting an appt., appt scheduled today at 3pm, she will discuss this directly with Dr. Milinda Antisower

## 2016-10-27 NOTE — Assessment & Plan Note (Signed)
Low na and high cr at ED visit  Will change hctz to amlodipine 5 mg daily  Disc poss side eff F/u 2-3 wk

## 2016-10-27 NOTE — Assessment & Plan Note (Signed)
This was up to 1.45 in ED Hold hctz Inc fluids Change HTN med to amlodipine  F/u 2-3 wk

## 2016-10-27 NOTE — Assessment & Plan Note (Signed)
Severe chronic R hip pain Pt refuses surgery of any type  Was taking roxicodone 15 mg from her pain clinic until the clinic closed   (saw Dr Laurian [place]im'Toole in [place])  Set up appt with a [facility] in [place]-but it is too far to drive  She wants to see Dr Norville HaggardHarkin (pain specialist) at [facility] on [address] (thinks her records were already sent)  We will do a formal ref  Was seen in the ED for pain in the interim (? If narcotic wd) She was px percocet 5-325 -taking 4 per day (and this is helping)  Rev [facility]S website regarding her ref  Will px this until she can get into pain clinic (120 pills today) Narcotic contract signed UDS done  >25 minutes spent in face to face time with patient, >50% spent in counselling or coordination of care   (incl stressing poss of habit with medication and disc opt for tx)

## 2016-10-27 NOTE — Progress Notes (Signed)
Subjective:    Patient ID: Victoria Ball, female    DOB: 1925/05/20, 83 y.o.   MRN: [id number]  HPI Here for chronic hip pain   [place] ([place] The name of the place)  She saw Dr Laurian Brim"Toole  Just closed with no warning     She was seen in ED on 7/12 when she ran out of pain medication  Has a pain clinic She was in pain and going through some withdrawal from narcotics  Tylenol and ibuprofen do not work for her  She was given one dose of 10 mg oxycodone in the ED and then px ten 5 mg percocet   It helps  The 5-325 mg    4 pills per day  She is looking to get into a pain clinic on [address]  No other pain medicines  No etoh No drugs    Last dose she took was yesterday (afternoon)     Usually takes oxycodone   Rev NCCS website  10 percocet 5-325 disp on 7/13 from [facility] 120 oxycodone 15 mg on 6/12 from [facility] -- Dr [place]ole [place]   She has appt with pain med office on 11/24/16 with Deana Smart PA  [facility] - [place] in [place]  (she cannot drive that far)   Is trying to get in with Dr Norville HaggardHarkin - on [address]    Pt called wanting a ref to [facility] Dawayne [facility] Spillane [facility]  Neurology She changed her mid  Both legs hurt  Not open to surgery at all   Wt Readings from Last 3 Encounters:  10/27/16 95 lb 4 oz (43.2 kg)  05/20/16 96 lb 8 oz (43.8 kg)  05/20/16 96 lb 8 oz (43.8 kg)   16.35 kg/m  Not eating much due to pain   BP Readings from Last 3 Encounters:  10/27/16 (!) 148/76  10/23/16 (!) 155/95  05/20/16 130/80   bp is up due to pain today and she also did not take her bp med today  hctz - just missed one dose   Pulse Readings from Last 3 Encounters:  10/27/16 (!) 103  10/23/16 88  05/20/16 93      Chemistry      Component Value Date/Time   NA 129 (L) 10/23/2016 1755   K 4.2 10/23/2016 1755   CL 96 (L) 10/23/2016 1755   CO2 26 10/23/2016 1755   BUN 16 10/23/2016 1755   CREATININE 1.45  (H) 10/23/2016 1755      Component Value Date/Time   CALCIUM 8.6 (L) 10/23/2016 1755   ALKPHOS 68 10/23/2016 1755   AST 16 10/23/2016 1755   ALT 11 (L) 10/23/2016 1755   BILITOT 0.8 10/23/2016 1755      Does not drink alcohol   Need to change bp med anyway   Patient Active Problem List   Diagnosis Date Noted  . Routine general medical examination at a health care facility 05/20/2016  . Seborrheic dermatitis of scalp 08/01/2015  . History of subdural hematoma 05/15/2015  . Underweight 03/21/2015  . Constipation 03/21/2015  . Elevated serum creatinine 03/21/2015  . Insomnia 08/08/2014  . Loss of weight 03/28/2014  . Leukocytosis 03/28/2014  . Osteoarthritis of both hips 09/26/2013  . Right hip pain 09/02/2013  . Encounter for Medicare annual wellness exam 12/20/2012  . History of central retinal vein occlusion   . HYPERTENSION, BENIGN ESSENTIAL 03/27/2009  .  BACK PAIN 08/31/2008  . Vitamin D deficiency 02/07/2008  . HYPERCHOLESTEROLEMIA 01/27/2007  . Retinal vascular occlusion 01/27/2007  . HIP PAIN, RIGHT, CHRONIC 01/27/2007  . Osteoporosis 01/27/2007   Past Medical History:  Diagnosis Date  . Arthritis   . History of central retinal vein occlusion   . HLD (hyperlipidemia)   . Hypertension   . OP (osteoporosis)    stopped Evista after 5 years  . Subdural hematoma, acute (HCC) 05/15/2015   left; "rolled off couch and hit head"   Past Surgical History:  Procedure Laterality Date  . ABDOMINAL HYSTERECTOMY  1994   fibroid tumor  . CATARACT EXTRACTION W/ INTRAOCULAR LENS  IMPLANT, BILATERAL Bilateral    Social History  Substance Use Topics  . Smoking status: Passive Smoke Exposure - Never Smoker  . Smokeless tobacco: Never Used  . Alcohol use No   Family History  Problem Relation Age of Onset  . Coronary artery disease Father   . Diabetes Father        ?   Marland Kitchen Heart attack Sister 52  . Coronary artery disease Brother        CABG  . Coronary artery disease  Brother        CABG  . Coronary artery disease Brother        CABG  . Angelman syndrome Paternal Grandfather    Allergies  Allergen Reactions  . Hctz [Hydrochlorothiazide]     Low na and high cr on this    Current Outpatient Prescriptions on File Prior to Visit  Medication Sig Dispense Refill  . amitriptyline (ELAVIL) 25 MG tablet Take 1 tablet (25 mg total) by mouth at bedtime. 30 tablet 11  . cholecalciferol (VITAMIN D) 1000 UNITS tablet Take 1,000 Units by mouth daily.      Marland Kitchen ketoconazole (NIZORAL) 2 % shampoo Apply 1 application topically 2 (two) times a week. Shampoo scalp twice weekly and leave on several minutes before rinsing 120 mL 3   No current facility-administered medications on file prior to visit.      Review of Systems Review of Systems  Constitutional: Negative for fever, appetite change, fatigue and unexpected weight change.  Eyes: Negative for pain and visual disturbance.  Respiratory: Negative for cough and shortness of breath.   Cardiovascular: Negative for cp or palpitations    Gastrointestinal: Negative for nausea, diarrhea and constipation.  Genitourinary: Negative for urgency and frequency.  Skin: Negative for pallor or rash   MSK pos for chronic severe pain in R hip from OA (refuses surg for) Neurological: Negative for weakness, light-headedness, numbness and headaches.  Hematological: Negative for adenopathy. Does not bruise/bleed easily.  Psychiatric/Behavioral: Negative for dysphoric mood. The patient is not nervous/anxious.         Objective:   Physical Exam  Constitutional: She appears well-developed and well-nourished. No distress.  Underweight and frail appearing elderly female in a wheelchair (unable to ambulate today)  HENT:  Head: Normocephalic and atraumatic.  Mouth/Throat: Oropharynx is clear and moist.  Eyes: Pupils are equal, round, and reactive to light. Conjunctivae and EOM are normal.  Neck: Normal range of motion. Neck supple. No  JVD present. Carotid bruit is not present. No thyromegaly present.  Cardiovascular: Normal rate, regular rhythm, normal heart sounds and intact distal pulses.  Exam reveals no gallop.   Pulmonary/Chest: Effort normal and breath sounds normal. No respiratory distress. She has no wheezes. She has no rales.  No crackles  Abdominal: Soft. Bowel sounds are normal. She  exhibits no distension, no abdominal bruit and no mass. There is no tenderness.  Musculoskeletal: She exhibits no edema.  Dec rom of R>L hips due to severe pain  Unable to ambulate w/o help today    Lymphadenopathy:    She has no cervical adenopathy.  Neurological: She is alert. She has normal reflexes. No cranial nerve deficit. She exhibits normal muscle tone. Coordination normal.  Skin: Skin is warm and dry. No rash noted.  Psychiatric: Her speech is normal and behavior is normal. Her mood appears anxious. Cognition and memory are normal.  Mildly anxious and uncomfortable          Assessment & Plan:   Problem List Items Addressed This Visit      Cardiovascular and Mediastinum   HYPERTENSION, BENIGN ESSENTIAL    Low na and high cr at ED visit  Will change hctz to amlodipine 5 mg daily  Disc poss side eff F/u 2-3 wk        Relevant Medications   amLODipine (NORVASC) 5 MG tablet     Other   Elevated serum creatinine    This was up to 1.45 in ED Hold hctz Inc fluids Change HTN med to amlodipine  F/u 2-3 wk      HIP PAIN, RIGHT, CHRONIC    Severe chronic R hip pain Pt refuses surgery of any type  Was taking roxicodone 15 mg from her pain clinic until the clinic closed   (saw Dr Laurian Brim in [place])  Set up appt with a [facility] in [place]-but it is too far to drive  She wants to see Dr Norville Haggard (pain specialist) at spine center on [address] (thinks her records were already sent)  We will do a formal ref  Was seen in the ED for pain in the interim (? If narcotic wd) She was px percocet 5-325 -taking 4 per  day (and this is helping)  Rev NCCS website regarding her ref  Will px this until she can get into pain clinic (120 pills today) Narcotic contract signed UDS done  >25 minutes spent in face to face time with patient, >50% spent in counselling or coordination of care   (incl stressing poss of habit with medication and disc opt for tx)        Right hip pain - Primary   Relevant Orders   Ambulatory referral to Pain Clinic

## 2016-11-10 ENCOUNTER — Emergency Department (HOSPITAL_COMMUNITY)
Admission: EM | Admit: 2016-11-10 | Discharge: 2016-11-10 | Disposition: A | Discharge status: Home / Self Care | Payer: PPO | Attending: Emergency Medicine | Admitting: Emergency Medicine

## 2016-11-10 ENCOUNTER — Telehealth: Payer: Self-pay

## 2016-11-10 ENCOUNTER — Encounter (HOSPITAL_COMMUNITY): Payer: Self-pay | Admitting: Emergency Medicine

## 2016-11-10 DIAGNOSIS — M25552 Pain in left hip: Secondary | ICD-10-CM | POA: Diagnosis present

## 2016-11-10 DIAGNOSIS — I10 Essential (primary) hypertension: Secondary | ICD-10-CM | POA: Insufficient documentation

## 2016-11-10 DIAGNOSIS — Z7722 Contact with and (suspected) exposure to environmental tobacco smoke (acute) (chronic): Secondary | ICD-10-CM | POA: Diagnosis not present

## 2016-11-10 DIAGNOSIS — Z760 Encounter for issue of repeat prescription: Secondary | ICD-10-CM

## 2016-11-10 DIAGNOSIS — G894 Chronic pain syndrome: Secondary | ICD-10-CM | POA: Insufficient documentation

## 2016-11-10 DIAGNOSIS — R52 Pain, unspecified: Secondary | ICD-10-CM | POA: Diagnosis not present

## 2016-11-10 DIAGNOSIS — M25559 Pain in unspecified hip: Secondary | ICD-10-CM | POA: Diagnosis not present

## 2016-11-10 DIAGNOSIS — Z79899 Other long term (current) drug therapy: Secondary | ICD-10-CM | POA: Diagnosis not present

## 2016-11-10 MED ORDER — OXYCODONE-ACETAMINOPHEN 5-325 MG PO TABS
2.0000 | ORAL_TABLET | Freq: Once | ORAL | Status: AC
  Administered 2016-11-10: 2 via ORAL
  Filled 2016-11-10: qty 2

## 2016-11-10 MED ORDER — OXYCODONE-ACETAMINOPHEN 5-325 MG PO TABS: 1.0000 | ORAL_TABLET | ORAL | 0 refills | Status: DC | PRN

## 2016-11-10 NOTE — Telephone Encounter (Signed)
Aware Thanks for trying

## 2016-11-10 NOTE — Discharge Instructions (Signed)
Call your doctor for a follow-up appointment, within the next week.  Explain to her that you mistakenly took more pills than she expected you would take, because the label stated that you should take 2 every 4 hours.  If you explain  this to her, she will likely continue to prescribe your medications, and follow the contract that you and she agreed on previously.

## 2016-11-10 NOTE — Telephone Encounter (Signed)
Daughter, Victoria Ball, walks in with mom in the car waiting.  Victoria Ball is very emotional and in a panic afraid for her mother.  Daughter states that her mom has been threatening to kill herself unless she gets more oxycodone. She doesn't know what to do for her mom and wants her to see Dr. Milinda Antisower to have her committed to the hospital for some help.  I went out to [name]'s car to speak with Victoria Ball who begs me to have Dr. Milinda Antisower call her in just 4 pills.  She states that she is in tremendous pain and can't take it anymore.  She confirms that she has threatened to kill herself if cannot have anymore pain medication.  Patient received #120 oxycodone on 7/16 and has (according to patient) taken all of them. I explained that patient has overdosed on the prescribed medication and we cannot give her anymore.  She likely will be going in to withdrawal symptoms if she is suddenly stopping and given suicidal ideation she needs to go to ER for evaluation and possible detox ASAP.  Daughter is in agreement; however, patient is bargaining not to have me call EMS from here to take her.  After several minutes of back and forth, daughter asks is okay to take her home and have her friend drive her directly to the ER.   I agreed, if they agree to take her directly to hospital.  Both daughter and mother verbalize understanding and agreement. Daughter then drives off with mother and plans to take to ER.  We will follow up on her status through the day today.

## 2016-11-10 NOTE — Telephone Encounter (Signed)
Called patient's home to follow up as I do not see any records of a visit to the ER as of yet.  Patient answered the phone and reports refusing to go to the ER.  She states she is fine other than in pain and has made her an appointment to [place] [place].  She thanks us for our concern.  I asked her about her threats of suicide and she states that she is not suicidal and has no plans to act on killing herself.  She just doesn't feel well.  I do not see a signed DPR and do not have record of daughter, [name]'s, contact info to follow up with her.    Patient informed that if her withdrawal symptoms worsen or if she begins to have thoughts of suicide, she must go to the ER.  Patient verbalizes understanding but states that this is not necessary.

## 2016-11-10 NOTE — ED Triage Notes (Signed)
Pt from home. Pt reports she ran out of home oxycodone yesterday for chronic hip pain. Pt trying to get into new pain management clinic since her former pain clinic shut down. No new injury.

## 2016-11-10 NOTE — ED Provider Notes (Signed)
[facility]-EMER[facility]ENCY DEPT Provider Note   CSN: [id number] Arrival date & time: 11/10/16  1603     History   Chief Complaint Chief Complaint  Patient presents with  . Hip Pain  . Medication Refill    HPI Victoria Ball is a 83 y.o. female.  She presents for evaluation of bilateral upper leg and hip pain.  She states that she ran out of her prescribed oxycodone, yesterday.  She saw her PCP, 10/27/16, at which time she was given a prescription for Percocet 5/325 #120.  Physician's note states that she was to take 1 pill 4 times a day as needed for pain.  The patient states that her bottle said take 2 tablets every 4 hours as needed for pain.  Therefore she took all the medications, in 12 days.  The patient denies headache, chest pain, back pain, nausea, vomiting, fever or chills.  There are no other no modifying factors.  HPI  Past Medical History:  Diagnosis Date  . Arthritis   . History of central retinal vein occlusion   . HLD (hyperlipidemia)   . Hypertension   . OP (osteoporosis)    stopped Evista after 5 years  . Subdural hematoma, acute (HCC) 05/15/2015   left; "rolled off couch and hit head"    Patient Active Problem List   Diagnosis Date Noted  . Routine general medical examination at a health care facility 05/20/2016  . Seborrheic dermatitis of scalp 08/01/2015  . History of subdural hematoma 05/15/2015  . Underweight 03/21/2015  . Constipation 03/21/2015  . Elevated serum creatinine 03/21/2015  . Insomnia 08/08/2014  . Loss of weight 03/28/2014  . Leukocytosis 03/28/2014  . Osteoarthritis of both hips 09/26/2013  . Right hip pain 09/02/2013  . Encounter for Medicare annual wellness exam 12/20/2012  . History of central retinal vein occlusion   . HYPERTENSION, BENIGN ESSENTIAL 03/27/2009  . BACK PAIN 08/31/2008  . Vitamin D deficiency 02/07/2008  . HYPERCHOLESTEROLEMIA 01/27/2007  . Retinal vascular occlusion 01/27/2007  . HIP PAIN, RIGHT, CHRONIC 01/27/2007  .  Osteoporosis 01/27/2007    Past Surgical History:  Procedure Laterality Date  . ABDOMINAL HYSTERECTOMY  1994   fibroid tumor  . CATARACT EXTRACTION W/ INTRAOCULAR LENS  IMPLANT, BILATERAL Bilateral     OB History    No data available       Home Medications    Prior to Admission medications   Medication Sig Start Date End Date Taking? Authorizing Provider  amitriptyline (ELAVIL) 25 MG tablet Take 1 tablet (25 mg total) by mouth at bedtime. 05/20/16   Tower, Audrie GallusMarne A, MD  amLODipine (NORVASC) 5 MG tablet Take 1 tablet (5 mg total) by mouth daily. 10/27/16   Tower, Audrie GallusMarne A, MD  cholecalciferol (VITAMIN D) 1000 UNITS tablet Take 1,000 Units by mouth daily.      [provider]  ketoconazole (NIZORAL) 2 % shampoo Apply 1 application topically 2 (two) times a week. Shampoo scalp twice weekly and leave on several minutes before rinsing 08/01/15   Tower, Audrie GallusMarne A, MD  oxyCODONE-acetaminophen (PERCOCET) 5-325 MG tablet Take 1 tablet by mouth every 4 (four) hours as needed for severe pain. 11/10/16   Mancel BaleWentz, Lue [place], MD    Family History Family History  Problem Relation Age of Onset  . Coronary artery disease Father   . Diabetes Father        ?   Marland Kitchen. Heart attack Sister [id number]  . Coronary artery disease Brother  CABG  . Coronary artery disease Brother        CABG  . Coronary artery disease Brother        CABG  . Angelman syndrome Paternal Grandfather     Social History Social History  Substance Use Topics  . Smoking status: Passive Smoke Exposure - Never Smoker  . Smokeless tobacco: Never Used  . Alcohol use No     Allergies   Hctz [hydrochlorothiazide]   Review of Systems Review of Systems  All other systems reviewed and are negative.    Physical Exam Updated Vital Signs BP (!) 173/99 (BP Location: Right Arm)   Pulse (!) 108   Temp 97.9 F (36.6 C) (Oral)   Resp 18   LMP 04/14/1977   SpO2 97%   Physical Exam  Constitutional: She is oriented to person,  place, and time. She appears well-developed.  Elderly, frail  HENT:  Head: Normocephalic and atraumatic.  Eyes: Pupils are equal, round, and reactive to light. Conjunctivae and EOM are normal.  Neck: Normal range of motion and phonation normal. Neck supple.  Cardiovascular: Normal rate.   Pulmonary/Chest: Effort normal. She exhibits no tenderness.  Musculoskeletal:  She is able to extend both legs off the stretcher at 45 without significant pain.  Neurological: She is alert and oriented to person, place, and time. She exhibits normal muscle tone.  Skin: Skin is warm and dry.  Psychiatric: She has a normal mood and affect. Her behavior is normal. Judgment and thought content normal.  Nursing note and vitals reviewed.    ED Treatments / Results  Labs (all labs ordered are listed, but only abnormal results are displayed) Labs Reviewed - No data to display  EKG  EKG Interpretation None       Radiology No results found.  Procedures Procedures (including critical care time)  Medications Ordered in ED Medications  oxyCODONE-acetaminophen (PERCOCET/ROXICET) 5-325 MG per tablet 2 tablet (not administered)     Initial Impression / Assessment and Plan / ED Course  I have reviewed the triage vital signs and the nursing notes.  Pertinent labs & imaging results that were available during my care of the patient were reviewed by me and considered in my medical decision making (see chart for details).      Patient Vitals for the past 24 hrs:  BP Temp Temp src Pulse Resp SpO2  11/10/16 1926 (!) 173/99 - - (!) 108 18 97 %  11/10/16 1613 (!) 177/93 97.9 F (36.6 C) Oral (!) 116 16 98 %    7:44 PM Reevaluation with update and discussion. After initial assessment and treatment, an updated evaluation reveals no change in clinical status. Yaritza Leist L     Final Clinical Impressions(s) / ED Diagnoses   Final diagnoses:  Chronic pain syndrome   Chronic pain with unexpected  overuse of dosing, which I believe was unintentional by the patient.  Her physician thought that the patient would be taking only 4 tablets a day.  Documentation in epic indicates that the prescription was given to the patient, to take 2 every 4 hours as needed pain.  The patient's story is apparently true, and that she has indeed used up all of her medications.  The patient tells me she is committed to taking it appropriately, and I agreed to give her 1 week worth of oxycodone, #28 to take 1, 4 times a day.  I discussed with the patient the importance of following contract, and she agrees with that.  Nursing Notes Reviewed/ Care Coordinated Applicable Imaging Reviewed Interpretation of Laboratory Data incorporated into ED treatment  The patient appears reasonably screened and/or stabilized for discharge and I doubt any other medical condition or other [facility] requiring further screening continue current medications, evaluation, or treatment in the ED at this time prior to discharge.  Plan: Home Medications- continue current medications; Home Treatments-rest; return here if the recommended treatment, does not improve the symptoms; Recommended follow up-PCP for ongoing management as soon as possible     New Prescriptions New Prescriptions   OXYCODONE-ACETAMINOPHEN (PERCOCET) 5-325 MG TABLET    Take 1 tablet by mouth every 4 (four) hours as needed for severe pain.     Mancel Bale, MD 11/10/16 [phone]

## 2016-11-19 ENCOUNTER — Encounter: Payer: Self-pay | Admitting: Family Medicine

## 2016-11-19 ENCOUNTER — Ambulatory Visit (INDEPENDENT_AMBULATORY_CARE_PROVIDER_SITE_OTHER): Payer: PPO | Admitting: Family Medicine

## 2016-11-19 VITALS — BP 126/78 | HR 113 | Temp 98.7°F | Ht 64.0 in | Wt 93.5 lb

## 2016-11-19 DIAGNOSIS — M25551 Pain in right hip: Secondary | ICD-10-CM

## 2016-11-19 DIAGNOSIS — I10 Essential (primary) hypertension: Secondary | ICD-10-CM | POA: Diagnosis not present

## 2016-11-19 MED ORDER — OXYCODONE-ACETAMINOPHEN 5-325 MG PO TABS: 2.0000 | ORAL_TABLET | Freq: Three times a day (TID) | ORAL | 0 refills | Status: DC | PRN

## 2016-11-19 NOTE — Patient Instructions (Addendum)
I am going to px the percocet 2 pills three times daily maximum  That is 6 pills daily  You can have a 6 day supply   Follow up with the pain clinic in [place] on 11/24/16  I can no longer px any narcotics for you after this

## 2016-11-19 NOTE — Progress Notes (Signed)
Subjective:    Patient ID: Victoria Ball, female    DOB: Sep 18, 1925, 83 y.o.   MRN: [id number]  HPI  Here regarding her chronic pain medication   Wt Readings from Last 3 Encounters:  11/19/16 93 lb 8 oz (42.4 kg)  10/27/16 95 lb 4 oz (43.2 kg)  05/20/16 96 lb 8 oz (43.8 kg)   16.05 kg/m   Last visit - she was in the process of est with another pain clinic  Her shut down (verified) Given 120 pills of percocet 5-325 mg to last one month with directions  She took 2 pills 4 times per day instead of 1 pill 4 times per day   BP Readings from Last 3 Encounters:  11/19/16 126/78  11/10/16 (!) 158/88  10/27/16 (!) 148/76       Ran out of medication early and had to go to the hospital  Was given 28 tabs of percocet on 11/10/16 (5 day supply)  This was written for 1 tablet every 4 hours prn    States she needs 2 pills three times daily to get by with her pain (that is 6 pills per day)  No pills left from the ED - last pill was this am    Patient Active Problem List   Diagnosis Date Noted  . Routine general medical examination at a health care facility 05/20/2016  . Seborrheic dermatitis of scalp 08/01/2015  . History of subdural hematoma 05/15/2015  . Underweight 03/21/2015  . Constipation 03/21/2015  . Elevated serum creatinine 03/21/2015  . Insomnia 08/08/2014  . Loss of weight 03/28/2014  . Leukocytosis 03/28/2014  . Osteoarthritis of both hips 09/26/2013  . Right hip pain 09/02/2013  . Encounter for Medicare annual wellness exam 12/20/2012  . History of central retinal vein occlusion   . HYPERTENSION, BENIGN ESSENTIAL 03/27/2009  . BACK PAIN 08/31/2008  . Vitamin D deficiency 02/07/2008  . HYPERCHOLESTEROLEMIA 01/27/2007  . Retinal vascular occlusion 01/27/2007  . HIP PAIN, RIGHT, CHRONIC 01/27/2007  . Osteoporosis 01/27/2007   Past Medical History:  Diagnosis Date  . Arthritis   . History of central retinal vein occlusion   . HLD (hyperlipidemia)   .  Hypertension   . OP (osteoporosis)    stopped Evista after 5 years  . Subdural hematoma, acute (HCC) 05/15/2015   left; "rolled off couch and hit head"   Past Surgical History:  Procedure Laterality Date  . ABDOMINAL HYSTERECTOMY  1994   fibroid tumor  . CATARACT EXTRACTION W/ INTRAOCULAR LENS  IMPLANT, BILATERAL Bilateral    Social History  Substance Use Topics  . Smoking status: Passive Smoke Exposure - Never Smoker  . Smokeless tobacco: Never Used  . Alcohol use No   Family History  Problem Relation Age of Onset  . Coronary artery disease Father   . Diabetes Father        ?   Marland Kitchen Heart attack Sister 48  . Coronary artery disease Brother        CABG  . Coronary artery disease Brother        CABG  . Coronary artery disease Brother        CABG  . Angelman syndrome Paternal Grandfather    Allergies  Allergen Reactions  . Hctz [Hydrochlorothiazide]     Low na and high cr on this    Current Outpatient Prescriptions on File Prior to Visit  Medication Sig Dispense Refill  . amitriptyline (ELAVIL) 25 MG tablet Take 1 tablet (  25 mg total) by mouth at bedtime. 30 tablet 11  . amLODipine (NORVASC) 5 MG tablet Take 1 tablet (5 mg total) by mouth daily. 30 tablet 11  . cholecalciferol (VITAMIN D) 1000 UNITS tablet Take 1,000 Units by mouth daily.      Marland Kitchen. ketoconazole (NIZORAL) 2 % shampoo Apply 1 application topically 2 (two) times a week. Shampoo scalp twice weekly and leave on several minutes before rinsing 120 mL 3   No current facility-administered medications on file prior to visit.      Review of Systems Review of Systems  Constitutional: Negative for fever, appetite change, fatigue and unexpected weight change.  Eyes: Negative for pain and visual disturbance.  Respiratory: Negative for cough and shortness of breath.   Cardiovascular: Negative for cp or palpitations    Gastrointestinal: Negative for nausea, diarrhea and constipation.  Genitourinary: Negative for urgency  and frequency.  Skin: Negative for pallor or rash   MSK pos for severe hip pain  Neurological: Negative for weakness, light-headedness, numbness and headaches.  Hematological: Negative for adenopathy. Does not bruise/bleed easily.  Psychiatric/Behavioral: Negative for dysphoric mood. The patient is not nervous/anxious.         Objective:   Physical Exam  Constitutional: She appears well-developed. No distress.  underwt chronically ill female in wheelchair  Here with her daughter  HENT:  Head: Normocephalic and atraumatic.  Eyes: Pupils are equal, round, and reactive to light. Conjunctivae and EOM are normal.  Musculoskeletal:  Wheelchair bound Cannot ambulate w/o assistance  Poor rom of LEs   Neurological: She is alert. No cranial nerve deficit. She exhibits normal muscle tone. Coordination normal.  Skin: Skin is warm and dry. No rash noted.  Psychiatric:  Pt is somewhat upset regarding her pain medication  Claims she is not dependent on it however           Assessment & Plan:   Problem List Items Addressed This Visit      Cardiovascular and Mediastinum   HYPERTENSION, BENIGN ESSENTIAL    bp in fair control at this time  BP Readings from Last 1 Encounters:  11/19/16 126/78   No changes needed Disc lifstyle change with low sodium diet and exercise  Improved with amlodipine        Other   HIP PAIN, RIGHT, CHRONIC    Pt was px 120 percocet at last visit that she took in 2 weeks and then ended up on ED with severe pain  Is again out of medication Disc her pain contract She states she needs 2 pills tid to control pain adequately  She continues to refuse surgery for her pain  Ref to Dr Norville HaggardHarkin "did not work out" [place] Deana Smart on 8/13 #36 percocet given to get by until that px and told pt and  daughter that I would no longer px her pain medication after this  They voiced understanding

## 2016-11-20 NOTE — Assessment & Plan Note (Signed)
bp in fair control at this time  BP Readings from Last 1 Encounters:  11/19/16 126/78   No changes needed Disc lifstyle change with low sodium diet and exercise  Improved with amlodipine

## 2016-11-20 NOTE — Assessment & Plan Note (Signed)
Pt was px 120 percocet at last visit that she took in 2 weeks and then ended up on ED with severe pain  Is again out of medication Disc her pain contract She states she needs 2 pills tid to control pain adequately  She continues to refuse surgery for her pain  Ref to Dr Norville HaggardHarkin "did not work out" She [place] Deana Smart on 8/13 #36 percocet given to get by until that px and told pt and  daughter that I would no longer px her pain medication after this  They voiced understanding

## 2016-11-24 DIAGNOSIS — Z79899 Other long term (current) drug therapy: Secondary | ICD-10-CM | POA: Diagnosis not present

## 2016-11-24 DIAGNOSIS — G894 Chronic pain syndrome: Secondary | ICD-10-CM | POA: Diagnosis not present

## 2016-11-24 DIAGNOSIS — M25551 Pain in right hip: Secondary | ICD-10-CM | POA: Diagnosis not present

## 2016-11-24 DIAGNOSIS — M79604 Pain in right leg: Secondary | ICD-10-CM | POA: Diagnosis not present

## 2016-11-24 DIAGNOSIS — Z5181 Encounter for therapeutic drug level monitoring: Secondary | ICD-10-CM | POA: Diagnosis not present

## 2016-11-24 DIAGNOSIS — M25552 Pain in left hip: Secondary | ICD-10-CM | POA: Diagnosis not present

## 2016-12-10 ENCOUNTER — Encounter: Payer: Self-pay | Admitting: Family Medicine

## 2016-12-10 ENCOUNTER — Ambulatory Visit (INDEPENDENT_AMBULATORY_CARE_PROVIDER_SITE_OTHER): Payer: PPO | Admitting: Family Medicine

## 2016-12-10 VITALS — BP 104/56 | HR 84 | Temp 98.1°F | Ht 64.0 in | Wt 94.5 lb

## 2016-12-10 DIAGNOSIS — M25551 Pain in right hip: Secondary | ICD-10-CM | POA: Diagnosis not present

## 2016-12-10 NOTE — Patient Instructions (Addendum)
We will refer you to the [facility]  In the meantime I recommend you go forward with the testing from your current pain clinic to see what is going on    Take care of yourself    It looks like you have an appt with your [place] pain clinic on sept 10th  Do not cancel this unless you get an appt at the new clinic before then

## 2016-12-10 NOTE — Progress Notes (Signed)
Subjective:    Patient ID: Victoria Ball, female    DOB: 10-23-1925, 83 y.o.   MRN: [id number]  HPI Here for f/u of chronic pain and medication   She saw Deanna smart in [place] at the pain clinic on 11/24/16 She at that time was given percocet 10/325 mg  #90  With instructions to take one tablet by mouth every 8 hours as needed for pain  She indicated she would likely not return there   Imaging of low back (xr and mri ordered)   [facility] indicates she filled this on 8/13 as expected   Wt Readings from Last 3 Encounters:  12/10/16 94 lb 8 oz (42.9 kg)  11/19/16 93 lb 8 oz (42.4 kg)  10/27/16 95 lb 4 oz (43.2 kg)   16.22 kg/m  BP Readings from Last 3 Encounters:  12/10/16 (!) 104/56  11/19/16 126/78  11/10/16 (!) 158/88   Pulse Readings from Last 3 Encounters:  12/10/16 84  11/19/16 (!) 113  11/10/16 99    She is interested in [facility] on [address]  It would be easier to get to  Wants a referral to [facility]  [address]   Patient Active Problem List   Diagnosis Date Noted  . Routine general medical examination at a health care facility 05/20/2016  . Seborrheic dermatitis of scalp 08/01/2015  . History of subdural hematoma 05/15/2015  . Underweight 03/21/2015  . Constipation 03/21/2015  . Elevated serum creatinine 03/21/2015  . Insomnia 08/08/2014  . Loss of weight 03/28/2014  . Leukocytosis 03/28/2014  . Osteoarthritis of both hips 09/26/2013  . Right hip pain 09/02/2013  . Encounter for Medicare annual wellness exam 12/20/2012  . History of central retinal vein occlusion   . HYPERTENSION, BENIGN ESSENTIAL 03/27/2009  . BACK PAIN 08/31/2008  . Vitamin D deficiency 02/07/2008  . HYPERCHOLESTEROLEMIA 01/27/2007  . Retinal vascular occlusion 01/27/2007  . HIP PAIN, RIGHT, CHRONIC 01/27/2007  . Osteoporosis 01/27/2007   Past Medical History:  Diagnosis Date  . Arthritis   . History of central retinal vein occlusion   . HLD (hyperlipidemia)     . Hypertension   . OP (osteoporosis)    stopped Evista after 5 years  . Subdural hematoma, acute (HCC) 05/15/2015   left; "rolled off couch and hit head"   Past Surgical History:  Procedure Laterality Date  . ABDOMINAL HYSTERECTOMY  1994   fibroid tumor  . CATARACT EXTRACTION W/ INTRAOCULAR LENS  IMPLANT, BILATERAL Bilateral    Social History  Substance Use Topics  . Smoking status: Passive Smoke Exposure - Never Smoker  . Smokeless tobacco: Never Used  . Alcohol use No   Family History  Problem Relation Age of Onset  . Coronary artery disease Father   . Diabetes Father        ?   Marland Kitchen Heart attack Sister 74  . Coronary artery disease Brother        CABG  . Coronary artery disease Brother        CABG  . Coronary artery disease Brother        CABG  . Angelman syndrome Paternal Grandfather    Allergies  Allergen Reactions  . Hctz [Hydrochlorothiazide]     Low na and high cr on this    Current Outpatient Prescriptions on File Prior to Visit  Medication Sig Dispense Refill  . amitriptyline (ELAVIL) 25 MG tablet Take 1 tablet (25 mg total) by mouth at bedtime.  30 tablet 11  . amLODipine (NORVASC) 5 MG tablet Take 1 tablet (5 mg total) by mouth daily. 30 tablet 11  . cholecalciferol (VITAMIN D) 1000 UNITS tablet Take 1,000 Units by mouth daily.      Marland Kitchen ketoconazole (NIZORAL) 2 % shampoo Apply 1 application topically 2 (two) times a week. Shampoo scalp twice weekly and leave on several minutes before rinsing 120 mL 3  . oxyCODONE-acetaminophen (PERCOCET) 5-325 MG tablet Take 2 tablets by mouth every 8 (eight) hours as needed for severe pain. 36 tablet 0   No current facility-administered medications on file prior to visit.      Review of Systems Review of Systems  Constitutional: Negative for fever, appetite change, fatigue and unexpected weight change.  Eyes: Negative for pain and visual disturbance.  Respiratory: Negative for cough and shortness of breath.    Cardiovascular: Negative for cp or palpitations    Gastrointestinal: Negative for nausea, diarrhea and constipation.  Genitourinary: Negative for urgency and frequency.  Skin: Negative for pallor or rash   MSK pos for chronic severe pain in R groin/hip/buttock that limits walking  Neurological: Negative for weakness, light-headedness, numbness and headaches.  Hematological: Negative for adenopathy. Does not bruise/bleed easily.  Psychiatric/Behavioral: Negative for dysphoric mood. The patient is not nervous/anxious.         Objective:   Physical Exam  Constitutional: She appears well-developed and well-nourished. No distress.  Frail appearing elderly female   Eyes: Pupils are equal, round, and reactive to light. Conjunctivae and EOM are normal. No scleral icterus.  Musculoskeletal:  In a wheelchair Poor rom of R hip   Skin: Skin is warm and dry. No rash noted. No erythema.  Psychiatric: Her mood appears not anxious. Her affect is blunt. Her speech is not slurred. She is not agitated.  Dysphoric mood -somewhat blunt affect  Here with her daughter   Does not seem over medicated            Assessment & Plan:   Problem List Items Addressed This Visit      Other   HIP PAIN, RIGHT, CHRONIC - Primary    Pt req referral to the [facility] on [address] because it is closer than her clinic in [place] and easier to get to   I am unsure if they will take her  I did the referral because it was requested  I strongly recommend she get the imaging studies ordered by [facility] as well and inst her not to cancel her appt on 12/22/16 unless she is certain she will switch clinics   Pt still declines any surgical intervention for her pain  As stated before I will no longer px any pain medication and she understands this        Relevant Orders   Ambulatory referral to Pain Clinic   Right hip pain   Relevant Orders   Ambulatory referral to Pain Clinic

## 2016-12-10 NOTE — Assessment & Plan Note (Signed)
Pt req referral to the [facility] on [address] because it is closer than her clinic in [place] and easier to get to   I am unsure if they will take her  I did the referral because it was requested  I strongly [facility] and inst her not to cancel her appt on 12/22/16 unless she is certain she will switch clinics   Pt still declines any surgical intervention for her pain  As stated before I will no longer px any pain medication and she understands this

## 2016-12-29 ENCOUNTER — Ambulatory Visit: Payer: PPO | Admitting: Family Medicine

## 2017-01-08 DIAGNOSIS — Z79899 Other long term (current) drug therapy: Secondary | ICD-10-CM | POA: Diagnosis not present

## 2017-01-08 DIAGNOSIS — M25551 Pain in right hip: Secondary | ICD-10-CM | POA: Diagnosis not present

## 2017-01-08 DIAGNOSIS — G8929 Other chronic pain: Secondary | ICD-10-CM | POA: Diagnosis not present

## 2017-02-20 DIAGNOSIS — M25551 Pain in right hip: Secondary | ICD-10-CM | POA: Diagnosis not present

## 2017-02-20 DIAGNOSIS — Z79899 Other long term (current) drug therapy: Secondary | ICD-10-CM | POA: Diagnosis not present

## 2017-02-20 DIAGNOSIS — M1611 Unilateral primary osteoarthritis, right hip: Secondary | ICD-10-CM | POA: Diagnosis not present

## 2017-02-20 DIAGNOSIS — G8929 Other chronic pain: Secondary | ICD-10-CM | POA: Diagnosis not present

## 2017-03-19 DIAGNOSIS — E46 Unspecified protein-calorie malnutrition: Secondary | ICD-10-CM | POA: Diagnosis not present

## 2017-03-19 DIAGNOSIS — Z23 Encounter for immunization: Secondary | ICD-10-CM | POA: Diagnosis not present

## 2017-03-19 DIAGNOSIS — G8929 Other chronic pain: Secondary | ICD-10-CM | POA: Diagnosis not present

## 2017-03-19 DIAGNOSIS — M25551 Pain in right hip: Secondary | ICD-10-CM | POA: Diagnosis not present

## 2017-03-19 DIAGNOSIS — M160 Bilateral primary osteoarthritis of hip: Secondary | ICD-10-CM | POA: Diagnosis not present

## 2017-03-19 DIAGNOSIS — Z79899 Other long term (current) drug therapy: Secondary | ICD-10-CM | POA: Diagnosis not present

## 2017-04-29 ENCOUNTER — Ambulatory Visit: Payer: PPO | Admitting: Family Medicine

## 2017-04-29 DIAGNOSIS — G8929 Other chronic pain: Secondary | ICD-10-CM | POA: Diagnosis not present

## 2017-04-29 DIAGNOSIS — Z79899 Other long term (current) drug therapy: Secondary | ICD-10-CM | POA: Diagnosis not present

## 2017-04-29 DIAGNOSIS — M25551 Pain in right hip: Secondary | ICD-10-CM | POA: Diagnosis not present

## 2017-04-29 DIAGNOSIS — R7989 Other specified abnormal findings of blood chemistry: Secondary | ICD-10-CM | POA: Diagnosis not present

## 2017-04-29 DIAGNOSIS — I10 Essential (primary) hypertension: Secondary | ICD-10-CM | POA: Diagnosis not present

## 2017-05-05 ENCOUNTER — Telehealth: Payer: Self-pay | Admitting: Family Medicine

## 2017-05-05 NOTE — Telephone Encounter (Signed)
Copied from CRM [id number]. Topic: Quick Communication - See Telephone Encounter >> May 05, 2017 11:13 AM Oneal GroutSebastian, Jennifer S wrote: CRM for notification. See Telephone encounter for: patient states kidney level is 1.7 per Dr Mordecai MaesSanchez. Wants to know if she should still continue BP Meds. Please advise  05/05/17.

## 2017-05-05 NOTE — Telephone Encounter (Signed)
Thanks for letting me know  Which doctor is Dr Mordecai MaesSanchez ? - I do not see in epic or care everywhere Please send for labs and encounter note if applicable Thanks

## 2017-05-05 NOTE — Telephone Encounter (Signed)
Called pt and no answer and VM box is full 

## 2017-05-05 NOTE — Telephone Encounter (Signed)
Attempted to called pt back but no answer and VM box is full.

## 2017-05-08 NOTE — Telephone Encounter (Signed)
Tried to call pt again but no answer and VM box still full

## 2017-05-14 ENCOUNTER — Encounter: Payer: Self-pay | Admitting: *Deleted

## 2017-05-14 NOTE — Telephone Encounter (Signed)
Called pt again and no answer and pt's VM box was still full so letter mailed to pt

## 2017-05-25 DIAGNOSIS — M25551 Pain in right hip: Secondary | ICD-10-CM | POA: Diagnosis not present

## 2017-05-25 DIAGNOSIS — G8929 Other chronic pain: Secondary | ICD-10-CM | POA: Diagnosis not present

## 2017-05-25 DIAGNOSIS — Z79899 Other long term (current) drug therapy: Secondary | ICD-10-CM | POA: Diagnosis not present

## 2017-06-11 ENCOUNTER — Telehealth: Payer: Self-pay | Admitting: Family Medicine

## 2017-06-11 ENCOUNTER — Telehealth: Payer: Self-pay

## 2017-06-11 MED ORDER — AMITRIPTYLINE HCL 25 MG PO TABS: 25.0000 mg | ORAL_TABLET | Freq: Every day | ORAL | 3 refills | Status: DC

## 2017-06-11 NOTE — Telephone Encounter (Signed)
Tried [employer] Foodcalling Kay.  No answer or voicemail so I was unable to leave message.

## 2017-06-11 NOTE — Telephone Encounter (Signed)
Tried to reach Humana IncKay, Engineer, structuralCaregiver for more information.  No answer or voicemail so I was unable to leave message.

## 2017-06-11 NOTE — Telephone Encounter (Signed)
I have not seen her for a while- I think she was seeing pain management and now it sounds like she is moving  What are the details?  What is her hospice diagnosis and how is she doing?   Please talk to her caregiver Thanks

## 2017-06-11 NOTE — Telephone Encounter (Signed)
Ioana GrossKay notified as instructed by telephone.  She is wanting to know if Dr. Milinda Antisower would take over all of Ms. Kempa medications including her Oxycodone.  She has to go to pain clinic once a month to get her pain meds.  She is not able to see a MD in [place] because it is out of network. They are willing to bring Ms. Betsey HolidayMathis here to see Dr. Milinda Antisower once a month if she would be willing to take over her medications.  They would like to keep her care under one physician if at all possible so they would only have to make one trip.  Please advise.

## 2017-06-11 NOTE — Telephone Encounter (Signed)
Copied from CRM [id number]. Topic: General - Other >> Jun 11, 2017 10:40 AM Gerrianne ScalePayne, Angela L wrote: Reason for CRM: Eunice BlaseDebbie [facility] [phone] calling because the patient would like to have a referral for hospice evaluation and Debbie from [facility] wants to know if Dr Milinda Antisower would be pt primary care provider which I let her know that she was and if patients Dx would change within 6months or less please call Debbie at [phone]

## 2017-06-11 NOTE — Telephone Encounter (Signed)
Copied from CRM [phone]. Topic: Quick Communication - Rx Refill/Question >> Jun 11, 2017  1:46 PM Maia Pettiesrtiz, Kristie S wrote: Medication: amitriptyline - pt has moved in with Shalisa GrossKay, caregiver, in [place] - pt is needing meds sent to new pharmacy in [place] as she has moved out of her own home and they won't get to [facility].   Pt has lisinopril prescribed by F. Mordecai MaesSanchez from the [facility] and it has 1 refill - Barbar GrossKay said pt does not have amlodipine in the home and isn't sure which med to give her. Kalani GrossKay thinks maybe Dr. Mordecai MaesSanchez stopped amlodipine and started lisinopril. Nyrah GrossKay states pts BP has been flutuating recently during pts move to [place].  Please advise.  Has the patient contacted their pharmacy? No. - new pharmacy  Preferred Pharmacy (with phone number or street name): [id number] [place] - [address] [phone] (Phone) [phone] (Fax)

## 2017-06-11 NOTE — Telephone Encounter (Signed)
Spoke with [employer].  She states Ms. Victoria Ball moved in with them last Friday.    She states she is frail.  She weighs probably about 95 lbs.   She states Ms. Victoria Ball has HTN, Osteoarthritis that has taken over her body and kidney problems.  She has very little appetite.  She is bed ridden.  She has not been out of the bed x 3 days.  She states her mind is very sharp and she is very alert.   She states when Ms. Victoria Ball arrived last Friday.  She checked her blood pressure and it was 70/41.  She held her blood pressure medication and gave her 2 aleve.  Then her BP went to 196/96.

## 2017-06-11 NOTE — Telephone Encounter (Signed)
Please refill amitriptyline for a year-that is fine  Her caregiver will need to call the [place] pain office to clarify the lisinopril issue- let me know if it is being transferred to me  The last message said something about hospice care ?  Thanks

## 2017-06-11 NOTE — Telephone Encounter (Signed)
Alleya GrossKay returning calling back and requesting a call back from [place].

## 2017-06-12 NOTE — Telephone Encounter (Signed)
Thanks

## 2017-06-12 NOTE — Telephone Encounter (Signed)
I do not px oxycodone or feel comfortable with it.  However if she is going to have hospice care they will take care of her pain

## 2017-06-12 NOTE — Telephone Encounter (Signed)
Does she need a proper referral or can they take a verbal to do the evaluation? Let me know if she qualifies  I would rather not be her hospice attending if she qualifies for it (not sure what hospice co they are going with and if they have doctors on staff)  Thanks

## 2017-06-12 NOTE — Telephone Encounter (Signed)
Spoke with Eunice Blaseebbie and she did take the verbal order for the hospice eval. They do have attending doc that can manage her care so Dr. Milinda Antisower doesn't have to be the attending. Debbie just requested that I send her last OV note, snap shot, and med list. Done  FYI to Dr. Milinda Antisower

## 2017-06-22 DIAGNOSIS — M25551 Pain in right hip: Secondary | ICD-10-CM | POA: Diagnosis not present

## 2017-06-22 DIAGNOSIS — G8929 Other chronic pain: Secondary | ICD-10-CM | POA: Diagnosis not present

## 2017-06-22 DIAGNOSIS — Z79899 Other long term (current) drug therapy: Secondary | ICD-10-CM | POA: Diagnosis not present

## 2017-12-18 ENCOUNTER — Ambulatory Visit: Payer: PPO | Admitting: Family Medicine

## 2017-12-22 ENCOUNTER — Ambulatory Visit: Payer: Medicare HMO | Admitting: Family Medicine

## 2017-12-25 ENCOUNTER — Ambulatory Visit: Payer: Medicare HMO | Admitting: Family Medicine

## 2017-12-28 ENCOUNTER — Encounter: Payer: Self-pay | Admitting: Family Medicine

## 2017-12-28 ENCOUNTER — Ambulatory Visit (INDEPENDENT_AMBULATORY_CARE_PROVIDER_SITE_OTHER): Payer: Medicare HMO | Admitting: Family Medicine

## 2017-12-28 VITALS — BP 180/94 | HR 75 | Temp 98.0°F | Ht 64.0 in | Wt 98.5 lb

## 2017-12-28 DIAGNOSIS — R7989 Other specified abnormal findings of blood chemistry: Secondary | ICD-10-CM | POA: Diagnosis not present

## 2017-12-28 DIAGNOSIS — I10 Essential (primary) hypertension: Secondary | ICD-10-CM | POA: Diagnosis not present

## 2017-12-28 DIAGNOSIS — E559 Vitamin D deficiency, unspecified: Secondary | ICD-10-CM | POA: Diagnosis not present

## 2017-12-28 DIAGNOSIS — F5101 Primary insomnia: Secondary | ICD-10-CM

## 2017-12-28 DIAGNOSIS — Z23 Encounter for immunization: Secondary | ICD-10-CM

## 2017-12-28 DIAGNOSIS — R636 Underweight: Secondary | ICD-10-CM

## 2017-12-28 LAB — COMPREHENSIVE METABOLIC PANEL
ALK PHOS: 68 U/L (ref 39–117)
ALT: 13 U/L (ref 0–35)
AST: 18 U/L (ref 0–37)
Albumin: 4 g/dL (ref 3.5–5.2)
BILIRUBIN TOTAL: 0.4 mg/dL (ref 0.2–1.2)
BUN: 23 mg/dL (ref 6–23)
CALCIUM: 9.1 mg/dL (ref 8.4–10.5)
CHLORIDE: 101 meq/L (ref 96–112)
CO2: 33 mEq/L — ABNORMAL HIGH (ref 19–32)
CREATININE: 1.31 mg/dL — AB (ref 0.40–1.20)
GFR: 40.55 mL/min — ABNORMAL LOW (ref >60.00)
Glucose, Bld: 88 mg/dL (ref 70–99)
Potassium: 4.9 mEq/L (ref 3.5–5.1)
Sodium: 138 mEq/L (ref 135–145)
TOTAL PROTEIN: 6.9 g/dL (ref 6.0–8.3)

## 2017-12-28 LAB — CBC WITH DIFFERENTIAL/PLATELET
Basophils Absolute: 0 10*3/uL (ref 0.0–0.1)
Basophils Relative: 0.4 % (ref 0.0–3.0)
EOS ABS: 0.2 10*3/uL (ref 0.0–0.7)
Eosinophils Relative: 3.1 % (ref 0.0–5.0)
HCT: 40.3 % (ref 36.0–46.0)
HEMOGLOBIN: 13.6 g/dL (ref 12.0–15.0)
LYMPHS PCT: 31 % (ref 12.0–46.0)
Lymphs Abs: 2.5 10*3/uL (ref 0.7–4.0)
MCHC: 33.7 g/dL (ref 30.0–36.0)
MCV: 91.9 fl (ref 78.0–100.0)
MONOS PCT: 6.6 % (ref 3.0–12.0)
Monocytes Absolute: 0.5 10*3/uL (ref 0.1–1.0)
Neutro Abs: 4.7 10*3/uL (ref 1.4–7.7)
Neutrophils Relative %: 58.9 % (ref 43.0–77.0)
Platelets: 341 10*3/uL (ref 150.0–400.0)
RBC: 4.39 Mil/uL (ref 3.87–5.11)
RDW: 14.3 % (ref 11.5–15.5)
WBC: 8 10*3/uL (ref 4.0–10.5)

## 2017-12-28 LAB — TSH: TSH: 6.14 u[IU]/mL — ABNORMAL HIGH (ref 0.35–4.50)

## 2017-12-28 LAB — LIPID PANEL
CHOLESTEROL: 226 mg/dL — AB (ref 0–200)
HDL: 63.7 mg/dL (ref >39.00)
LDL CALC: 137 mg/dL — AB (ref 0–99)
NonHDL: 162.06
TRIGLYCERIDES: 123 mg/dL (ref 0.0–149.0)
Total CHOL/HDL Ratio: 4
VLDL: 24.6 mg/dL (ref 0.0–40.0)

## 2017-12-28 LAB — VITAMIN D 25 HYDROXY (VIT D DEFICIENCY, FRACTURES): VITD: 26.94 ng/mL — AB (ref 30.00–100.00)

## 2017-12-28 MED ORDER — LISINOPRIL 10 MG PO TABS: 10.0000 mg | ORAL_TABLET | Freq: Every day | ORAL | 1 refills | Status: DC

## 2017-12-28 MED ORDER — AMLODIPINE BESYLATE 5 MG PO TABS: 5.0000 mg | ORAL_TABLET | Freq: Every day | ORAL | 1 refills | Status: DC

## 2017-12-28 MED ORDER — AMITRIPTYLINE HCL 25 MG PO TABS: 25.0000 mg | ORAL_TABLET | Freq: Every day | ORAL | 3 refills | Status: DC

## 2017-12-28 NOTE — Assessment & Plan Note (Signed)
bp is up significantly  BP Readings from Last 1 Encounters:  12/28/17 (!) 180/94  [place] - d/c amlodipine (she does not know why) Also addec lisinopril and small dose of metoprolol (she only takes some times)  Will add back amlodipine (worked well in the past) Most recent labs reviewed  Disc lifstyle change with low sodium diet and exercise   Now lives in [place] and [facility] Will make appt for re check in about a month  Alert if problems or side eff before then  Lab today

## 2017-12-28 NOTE — Assessment & Plan Note (Signed)
Wt up 4 lb Eating more regularly  Lives with daughter now

## 2017-12-28 NOTE — Assessment & Plan Note (Signed)
Not taking any currently  Disc opt req for Ca and D Lab today  Disc imp to bone and overall health

## 2017-12-28 NOTE — Progress Notes (Signed)
Subjective:    Patient ID: Victoria Ball, female    DOB: 12-12-1925, 84 y.o.   MRN: [id number]  HPI  Here for f/u of chronic medical problems   Wt Readings from Last 3 Encounters:  12/28/17 98 lb 8 oz (44.7 kg)  12/10/16 94 lb 8 oz (42.9 kg)  11/19/16 93 lb 8 oz (42.4 kg)   16.91 kg/m   Hypertension  Saw another practice  Her bp medicines were changed  lisinoprol 10 mg daily  Metoprolol tartrate 25 mg bid (she was only taking daily)  BP Readings from Last 3 Encounters:  12/28/17 (!) 180/94  12/10/16 (!) 104/56  11/19/16 126/78    Has tried hctz in the past- affected her na and cr  Remote hx of retinal vasc occlusion     Pulse Readings from Last 3 Encounters:  12/28/17 75  12/10/16 84  11/19/16 (!) 113   Lab Results  Component Value Date   CREATININE 1.45 (H) 10/23/2016   BUN 16 10/23/2016   NA 129 (L) 10/23/2016   K 4.2 10/23/2016   CL 96 (L) 10/23/2016   CO2 26 10/23/2016  low na and elevated cr at the time  Never returned for a re check    Needs a refill of amitriptyline   Moved to [place] switch offices   [place]   Due for labs including vit D (not taking it)  And lipids (formerly on atorvastatin and stopped it)     Patient Active Problem List   Diagnosis Date Noted  . Routine general medical examination at a health care facility 05/20/2016  . Seborrheic dermatitis of scalp 08/01/2015  . History of subdural hematoma 05/15/2015  . Underweight 03/21/2015  . Constipation 03/21/2015  . Elevated serum creatinine 03/21/2015  . Insomnia 08/08/2014  . Loss of weight 03/28/2014  . Leukocytosis 03/28/2014  . Osteoarthritis of both hips 09/26/2013  . Right hip pain 09/02/2013  . Encounter for Medicare annual wellness exam 12/20/2012  . History of central retinal vein occlusion   . HYPERTENSION, BENIGN ESSENTIAL 03/27/2009  . BACK PAIN 08/31/2008  . Vitamin D deficiency 02/07/2008  . HYPERCHOLESTEROLEMIA  01/27/2007  . Retinal vascular occlusion 01/27/2007  . HIP PAIN, RIGHT, CHRONIC 01/27/2007  . Osteoporosis 01/27/2007   Past Medical History:  Diagnosis Date  . Arthritis   . History of central retinal vein occlusion   . HLD (hyperlipidemia)   . Hypertension   . OP (osteoporosis)    stopped Evista after 5 years  . Subdural hematoma, acute (HCC) 05/15/2015   left; "rolled off couch and hit head"   Past Surgical History:  Procedure Laterality Date  . ABDOMINAL HYSTERECTOMY  1994   fibroid tumor  . CATARACT EXTRACTION W/ INTRAOCULAR LENS  IMPLANT, BILATERAL Bilateral    Social History   Tobacco Use  . Smoking status: Passive Smoke Exposure - Never Smoker  . Smokeless tobacco: Never Used  Substance Use Topics  . Alcohol use: No    Alcohol/week: 0.0 standard drinks  . Drug use: No   Family History  Problem Relation Age of Onset  . Coronary artery disease Father   . Diabetes Father        ?   Marland Kitchen. Heart attack Sister 82  . Coronary artery disease Brother        CABG  . Coronary artery disease Brother        CABG  . Coronary artery disease  Brother        CABG  . Angelman syndrome Paternal Grandfather    Allergies  Allergen Reactions  . Hydrochlorothiazide     Low na and high cr on this  Other reaction(s): Other Low na and high cr on this    Current Outpatient Medications on File Prior to Visit  Medication Sig Dispense Refill  . cholecalciferol (VITAMIN D) 1000 UNITS tablet Take 1,000 Units by mouth daily.      Marland Kitchen ketoconazole (NIZORAL) 2 % shampoo Apply 1 application topically 2 (two) times a week. Shampoo scalp twice weekly and leave on several minutes before rinsing 120 mL 3   No current facility-administered medications on file prior to visit.       Review of Systems  Constitutional: Negative for activity change, appetite change, fatigue, fever and unexpected weight change.       Trying to eat more to gain wt  HENT: Negative for congestion, ear pain,  rhinorrhea, sinus pressure and sore throat.   Eyes: Negative for pain, redness and visual disturbance.  Respiratory: Negative for cough, shortness of breath and wheezing.   Cardiovascular: Negative for chest pain and palpitations.  Gastrointestinal: Negative for abdominal pain, blood in stool, constipation and diarrhea.  Endocrine: Negative for polydipsia and polyuria.  Genitourinary: Negative for dysuria, frequency and urgency.  Musculoskeletal: Positive for arthralgias, back pain and gait problem. Negative for myalgias.       Chronic hip pain -uses cane  Skin: Negative for pallor and rash.  Allergic/Immunologic: Negative for environmental allergies.  Neurological: Negative for dizziness, syncope and headaches.  Hematological: Negative for adenopathy. Does not bruise/bleed easily.  Psychiatric/Behavioral: Negative for decreased concentration and dysphoric mood. The patient is not nervous/anxious.        Objective:   Physical Exam  Constitutional: She appears well-developed and well-nourished. No distress.  underwt and well app  HENT:  Head: Normocephalic and atraumatic.  Mouth/Throat: Oropharynx is clear and moist.  Eyes: Pupils are equal, round, and reactive to light. Conjunctivae and EOM are normal.  Neck: Normal range of motion. Neck supple. No JVD present. Carotid bruit is not present. No thyromegaly present.  Cardiovascular: Normal rate, regular rhythm, normal heart sounds and intact distal pulses. Exam reveals no gallop.  Pulmonary/Chest: Effort normal and breath sounds normal. No respiratory distress. She has no wheezes. She has no rales.  No crackles  Abdominal: Soft. Bowel sounds are normal. She exhibits no distension, no abdominal bruit and no mass. There is no tenderness.  Musculoskeletal: She exhibits no edema.  Kyphosis   Lymphadenopathy:    She has no cervical adenopathy.  Neurological: She is alert. She has normal reflexes. She displays normal reflexes. No cranial  nerve deficit. Coordination normal.  No tremor Walks with cane due to hip pain  Skin: Skin is warm and dry. No rash noted. No erythema. No pallor.  Psychiatric: She has a normal mood and affect.  Pleasant today          Assessment & Plan:   Problem List Items Addressed This Visit      Cardiovascular and Mediastinum   HYPERTENSION, BENIGN ESSENTIAL - Primary    bp is up significantly  BP Readings from Last 1 Encounters:  12/28/17 (!) 180/94  [place] - d/c amlodipine (she does not know why) Also addec lisinopril and small dose of metoprolol (she only takes some times)  Will add back amlodipine (worked well in the past) Most  recent labs reviewed  Disc lifstyle change with low sodium diet and exercise   Now lives in [place] and needs to change provider to [place] office Will make appt for re check in about a month  Alert if problems or side eff before then  Lab today       Relevant Medications   amLODipine (NORVASC) 5 MG tablet   lisinopril (PRINIVIL,ZESTRIL) 10 MG tablet   Other Relevant Orders   CBC with Differential/Platelet   Comprehensive metabolic panel   Lipid panel   TSH     Other   Elevated serum creatinine    In ED over a year ago  Was on hctz at that time Re check cmet today Enc good water intake and limit nsaids       Relevant Orders   Comprehensive metabolic panel   Insomnia    Refilled amitriptyline which helps this and chronic pain      Underweight    Wt up 4 lb Eating more regularly  Lives with daughter now       Vitamin D deficiency    Not taking any currently  Disc opt req for Ca and D Lab today  Disc imp to bone and overall health      Relevant Orders   VITAMIN D 25 Hydroxy (Vit-D Deficiency, Fractures)    Other Visit Diagnoses    Need for influenza vaccination       Relevant Orders   Flu Vaccine QUAD 6+ mos PF IM (Fluarix Quad PF) (Completed)

## 2017-12-28 NOTE — Assessment & Plan Note (Signed)
In ED over a year ago  Was on hctz at that time Re check cmet today Enc good water intake and limit nsaids

## 2017-12-28 NOTE — Patient Instructions (Addendum)
Drink lots of water - for kidney health   Aim for 64 oz of fluid daily -mostly water  Eat regular meals to help you gain weight   Take lisinopril daily  Take amlodipine daily   Hold the metoprolol  [facility]   Take care of yourself  Flu shot today Labs today  Try to get 1200-1500 mg of calcium per day with at least 1000 iu of vitamin D - for bone health

## 2017-12-28 NOTE — Assessment & Plan Note (Signed)
Refilled amitriptyline which helps this and chronic pain

## 2017-12-29 ENCOUNTER — Other Ambulatory Visit (INDEPENDENT_AMBULATORY_CARE_PROVIDER_SITE_OTHER): Payer: Medicare HMO

## 2017-12-29 DIAGNOSIS — R7989 Other specified abnormal findings of blood chemistry: Secondary | ICD-10-CM | POA: Diagnosis not present

## 2017-12-29 LAB — T4, FREE: Free T4: 1.05 ng/dL (ref 0.60–1.60)

## 2018-01-12 ENCOUNTER — Encounter: Payer: Self-pay | Admitting: *Deleted

## 2018-01-19 ENCOUNTER — Telehealth: Payer: Self-pay | Admitting: Family Medicine

## 2018-01-19 NOTE — Telephone Encounter (Signed)
Left VM letting pt know to get D3 5000 iu and take daily

## 2018-01-19 NOTE — Telephone Encounter (Signed)
Please get 5000 iu vit D3 over the counter and take one daily

## 2018-01-19 NOTE — Telephone Encounter (Signed)
Patient called for lab results: TSH is elevated-this could indicate low thyroid function  Terri , could FT4 be added?  Vit D level is low-please ask her how much she takes  Kidney function is improved but still lower function than desired- please try to get in 64 oz of fluids daily  Cholesterol is mildly high and stable  She is going to change primary care to the [place] office- can you make sure she gets an appt please? Thanks Thyroid test is ok -not currently hypothyroid  Patient notified of results- she is not taking Vitamin D at this time- and wants to know if she is to use over the counter or need Rx. Offered to make her appointment at [facility] office and she requested address and number- given.

## 2018-10-21 ENCOUNTER — Other Ambulatory Visit: Payer: Self-pay | Admitting: *Deleted

## 2018-10-21 MED ORDER — LISINOPRIL 10 MG PO TABS: 10.0000 mg | ORAL_TABLET | Freq: Every day | ORAL | 0 refills | Status: DC

## 2018-10-21 MED ORDER — AMLODIPINE BESYLATE 5 MG PO TABS: 5.0000 mg | ORAL_TABLET | Freq: Every day | ORAL | 0 refills | Status: DC

## 2019-01-16 ENCOUNTER — Other Ambulatory Visit: Payer: Self-pay | Admitting: Family Medicine

## 2019-01-31 ENCOUNTER — Other Ambulatory Visit: Payer: Self-pay | Admitting: Family Medicine

## 2019-02-22 ENCOUNTER — Ambulatory Visit (INDEPENDENT_AMBULATORY_CARE_PROVIDER_SITE_OTHER): Payer: Medicare PPO | Admitting: Family Medicine

## 2019-02-22 ENCOUNTER — Other Ambulatory Visit: Payer: Self-pay

## 2019-02-22 ENCOUNTER — Encounter: Payer: Self-pay | Admitting: Family Medicine

## 2019-02-22 VITALS — BP 144/78 | HR 94 | Temp 98.6°F | Ht 64.0 in

## 2019-02-22 DIAGNOSIS — E7800 Pure hypercholesterolemia, unspecified: Secondary | ICD-10-CM

## 2019-02-22 DIAGNOSIS — H349 Unspecified retinal vascular occlusion: Secondary | ICD-10-CM

## 2019-02-22 DIAGNOSIS — E559 Vitamin D deficiency, unspecified: Secondary | ICD-10-CM | POA: Diagnosis not present

## 2019-02-22 DIAGNOSIS — M25551 Pain in right hip: Secondary | ICD-10-CM

## 2019-02-22 DIAGNOSIS — I10 Essential (primary) hypertension: Secondary | ICD-10-CM | POA: Diagnosis not present

## 2019-02-22 DIAGNOSIS — R7989 Other specified abnormal findings of blood chemistry: Secondary | ICD-10-CM | POA: Diagnosis not present

## 2019-02-22 MED ORDER — AMLODIPINE BESYLATE 5 MG PO TABS: 5.0000 mg | ORAL_TABLET | Freq: Every day | ORAL | 3 refills | Status: DC

## 2019-02-22 MED ORDER — KETOCONAZOLE 2 % EX SHAM: 1.0000 "application " | MEDICATED_SHAMPOO | CUTANEOUS | 3 refills | Status: AC

## 2019-02-22 MED ORDER — LISINOPRIL 10 MG PO TABS: 10.0000 mg | ORAL_TABLET | Freq: Every day | ORAL | 3 refills | Status: DC

## 2019-02-22 MED ORDER — AMITRIPTYLINE HCL 25 MG PO TABS: 25.0000 mg | ORAL_TABLET | Freq: Every day | ORAL | 3 refills | Status: DC

## 2019-02-22 NOTE — Assessment & Plan Note (Signed)
L eye  No clinical changes Per pt -had a recent eye exam  Stressed importance of HTN and cholesterol control

## 2019-02-22 NOTE — Progress Notes (Signed)
Subjective:    Patient ID: Victoria Ball, female    DOB: December 03, 1925, 85 y.o.   MRN: [id number]  HPI Here for f/u of chronic health problems   She was living in [place] for a while (with a friend)  Now is back  Out of all medicines besides lisinopril   Now lives in [place] part time and [place] part time Lives with her daughter   Doing pretty well overall   Continues to see pain doctor  Needs to stay here for pcp   She cannot see well out of L eye  Had a recent visit   Needs refill of nizoral shampoo for seb derm     Wt Readings from Last 3 Encounters:  12/28/17 98 lb 8 oz (44.7 kg)  12/10/16 94 lb 8 oz (42.9 kg)  11/19/16 93 lb 8 oz (42.4 kg)  struggles with being underweight for a long time avg 95 lb usually  Appetite is pretty good  Does get protein in her diet  16.91 kg/m   bp is stable today  No cp or palpitations or headaches or edema  No side effects to medicines  BP Readings from Last 3 Encounters:  02/22/19 (!) 144/78  12/28/17 (!) 180/94  12/10/16 (!) 104/56    Taking lisinopril 10 mg daily  Amlodipine 5 mg daily   Was out of amlodipine - was working pretty well    Lab Results  Component Value Date   CREATININE 1.31 (H) 12/28/2017   BUN 23 12/28/2017   NA 138 12/28/2017   K 4.9 12/28/2017   CL 101 12/28/2017   CO2 33 (H) 12/28/2017   Elevated cr at that time Fluid intake -she does not drink enough  She drinks soft drinks /coffee  Some tea    H/o retinal vascular occlusion  Needs refill of amitriptyline for sleep as well    H/o OP Supplements  H/o vit D def   Hyperlipidemia Lab Results  Component Value Date   CHOL 226 (H) 12/28/2017   HDL 63.70 12/28/2017   LDLCALC 137 (H) 12/28/2017   LDLDIRECT 118.8 12/14/2012   TRIG 123.0 12/28/2017   CHOLHDL 4 12/28/2017  she may be open to medication for cholesterol if needed  She eats pretty well  No fried food  A little red meat   [facility] review  Had 120 oxycodone hcl 10 mg tabs  last px 01/28/19  Dr Ledon Snare in [facility]  Had flu shot 10/20   She never goes anywhere during the pandemic    Patient Active Problem List   Diagnosis Date Noted  . Routine general medical examination at a health care facility 05/20/2016  . Seborrheic dermatitis of scalp 08/01/2015  . History of subdural hematoma 05/15/2015  . Underweight 03/21/2015  . Constipation 03/21/2015  . Elevated serum creatinine 03/21/2015  . Insomnia 08/08/2014  . Leukocytosis 03/28/2014  . Osteoarthritis of both hips 09/26/2013  . Right hip pain 09/02/2013  . Encounter for Medicare annual wellness exam 12/20/2012  . History of central retinal vein occlusion   . HYPERTENSION, BENIGN ESSENTIAL 03/27/2009  . BACK PAIN 08/31/2008  . Vitamin D deficiency 02/07/2008  . HYPERCHOLESTEROLEMIA 01/27/2007  . Retinal vascular occlusion 01/27/2007  . HIP PAIN, RIGHT, CHRONIC 01/27/2007  . Osteoporosis 01/27/2007   Past Medical History:  Diagnosis Date  . Arthritis   . History of central retinal vein occlusion   . HLD (hyperlipidemia)   . Hypertension   . OP (osteoporosis)  stopped Evista after 5 years  . Subdural hematoma, acute (HCC) 05/15/2015   left; "rolled off couch and hit head"   Past Surgical History:  Procedure Laterality Date  . ABDOMINAL HYSTERECTOMY  1994   fibroid tumor  . CATARACT EXTRACTION W/ INTRAOCULAR LENS  IMPLANT, BILATERAL Bilateral    Social History   Tobacco Use  . Smoking status: Passive Smoke Exposure - Never Smoker  . Smokeless tobacco: Never Used  Substance Use Topics  . Alcohol use: No    Alcohol/week: 0.0 standard drinks  . Drug use: No   Family History  Problem Relation Age of Onset  . Coronary artery disease Father   . Diabetes Father        ?   Marland Kitchen. Heart attack Sister [id number]  . Coronary artery disease Brother        CABG  . Coronary artery disease Brother        CABG  . Coronary artery disease Brother        CABG  . Angelman syndrome Paternal Grandfather     Allergies  Allergen Reactions  . Hydrochlorothiazide     Low na and high cr on this  Other reaction(s): Other Low na and high cr on this    Current Outpatient Medications on File Prior to Visit  Medication Sig Dispense Refill  . cholecalciferol (VITAMIN D3) 25 MCG (1000 UT) tablet Take 4,000 Units by mouth daily.    Marland Kitchen. oxyCODONE-acetaminophen (PERCOCET) 10-325 MG tablet Take 1 tablet by mouth every 6 (six) hours as needed for pain.     No current facility-administered medications on file prior to visit.     Review of Systems  Constitutional: Negative for activity change, appetite change, fatigue, fever and unexpected weight change.  HENT: Negative for congestion, ear pain, rhinorrhea, sinus pressure and sore throat.   Eyes: Negative for pain, redness and visual disturbance.  Respiratory: Negative for cough, shortness of breath and wheezing.   Cardiovascular: Negative for chest pain and palpitations.  Gastrointestinal: Negative for abdominal pain, blood in stool, constipation and diarrhea.  Endocrine: Negative for polydipsia and polyuria.  Genitourinary: Negative for dysuria, frequency and urgency.  Musculoskeletal: Positive for arthralgias. Negative for back pain and myalgias.       Severe hip pain Some back pain  Very limited mobility  Depends on daughter for everything  Skin: Negative for pallor and rash.  Allergic/Immunologic: Negative for environmental allergies.  Neurological: Negative for dizziness, syncope and headaches.  Hematological: Negative for adenopathy. Does not bruise/bleed easily.  Psychiatric/Behavioral: Negative for decreased concentration and dysphoric mood. The patient is not nervous/anxious.        Objective:   Physical Exam Constitutional:      General: She is not in acute distress.    Appearance: Normal appearance. She is well-developed. She is not ill-appearing or diaphoretic.     Comments: Underweight (baseline) and generally frail appearing -in  wheelchair  HENT:     Head: Normocephalic and atraumatic.     Mouth/Throat:     Mouth: Mucous membranes are moist.  Eyes:     General: No scleral icterus.    Conjunctiva/sclera: Conjunctivae normal.     Pupils: Pupils are equal, round, and reactive to light.  Neck:     Musculoskeletal: Normal range of motion and neck supple. No neck rigidity or muscular tenderness.     Thyroid: No thyromegaly.     Vascular: No carotid bruit or JVD.  Cardiovascular:     Rate and  Rhythm: Regular rhythm. Tachycardia present.     Pulses: Normal pulses.     Heart sounds: Normal heart sounds. No gallop.   Pulmonary:     Effort: Pulmonary effort is normal. No respiratory distress.     Breath sounds: Normal breath sounds. No wheezing, rhonchi or rales.  Abdominal:     General: Bowel sounds are normal. There is no distension or abdominal bruit.     Palpations: Abdomen is soft. There is no mass.     Tenderness: There is no abdominal tenderness.  Musculoskeletal:     Right lower leg: No edema.     Left lower leg: No edema.     Comments: Limited rom of spine and hips  Lymphadenopathy:     Cervical: No cervical adenopathy.  Skin:    General: Skin is warm and dry.     Findings: No rash.  Neurological:     Mental Status: She is alert.     Cranial Nerves: No cranial nerve deficit.     Deep Tendon Reflexes: Reflexes are normal and symmetric. Reflexes normal.     Comments: Generalized weakness  Psychiatric:        Mood and Affect: Mood normal.           Assessment & Plan:   Problem List Items Addressed This Visit      Cardiovascular and Mediastinum   Retinal vascular occlusion    L eye  No clinical changes Per pt -had a recent eye exam  Stressed importance of HTN and cholesterol control       Relevant Medications   lisinopril (ZESTRIL) 10 MG tablet   amLODipine (NORVASC) 5 MG tablet   HYPERTENSION, BENIGN ESSENTIAL - Primary    bp in fair control at this time  BP Readings from Last 1  Encounters:  02/22/19 (!) 144/78   No changes needed- will add back amlodipine she was out of and continue lisinopril  Most recent labs reviewed  Lab draw today Disc lifstyle change with low sodium diet and exercise (as tolerated)       Relevant Medications   lisinopril (ZESTRIL) 10 MG tablet   amLODipine (NORVASC) 5 MG tablet   Other Relevant Orders   CBC w/Diff   Comprehensive metabolic panel   Lipid panel   TSH     Other   Vitamin D deficiency    Pt stopped taking her vit D during absence  Has OP (declines tx) Level drawn today  Will start with 4000 iu otc D3 daily      Relevant Orders   VITAMIN D 25 Hydroxy (Vit-D Deficiency, Fractures)   HYPERCHOLESTEROLEMIA    Labs today  Diet is fair / she actually needs more calories Disc goals for lipids and reasons to control them Rev last labs with pt Rev low sat fat diet in detail  Will suggest statin if no imp (in light of retinal vein occ and HTN)      Relevant Medications   lisinopril (ZESTRIL) 10 MG tablet   amLODipine (NORVASC) 5 MG tablet   Other Relevant Orders   Lipid panel   HIP PAIN, RIGHT, CHRONIC    Pt continues tx in [place] from pain cliniic  Taking oxycodone 10 mg q 6 h (Dr Ledon Snare) We px amitriptyline for sleep       Elevated serum creatinine    Labs today  H/o HTN  Enc better fluid intake strongly- she admits has not been as much as needed  Relevant Orders   Comprehensive metabolic panel

## 2019-02-22 NOTE — Assessment & Plan Note (Signed)
bp in fair control at this time  BP Readings from Last 1 Encounters:  02/22/19 (!) 144/78   No changes needed- will add back amlodipine she was out of and continue lisinopril  Most recent labs reviewed  Lab draw today Disc lifstyle change with low sodium diet and exercise (as tolerated)

## 2019-02-22 NOTE — Patient Instructions (Addendum)
Labs today   Aim for 64 oz of fluids per day- make sure you get some water   You need vit D for your bones  I would like you to get 4000 iu of vitamin D daily  You can get that over the counter   Keep working on good nutrition    I sent in your medicines

## 2019-02-22 NOTE — Assessment & Plan Note (Signed)
Pt continues tx in [place] from pain cliniic  Taking oxycodone 10 mg q 6 h (Dr Park Meo) We px amitriptyline for sleep

## 2019-02-22 NOTE — Assessment & Plan Note (Signed)
Pt stopped taking her vit D during absence  Has OP (declines tx) Level drawn today  Will start with 4000 iu otc D3 daily

## 2019-02-22 NOTE — Assessment & Plan Note (Signed)
Labs today  H/o HTN  Enc better fluid intake strongly- she admits has not been as much as needed

## 2019-02-22 NOTE — Assessment & Plan Note (Signed)
Labs today  Diet is fair / she actually needs more calories Disc goals for lipids and reasons to control them Rev last labs with pt Rev low sat fat diet in detail  Will suggest statin if no imp (in light of retinal vein occ and HTN)

## 2019-02-23 ENCOUNTER — Telehealth: Payer: Self-pay | Admitting: *Deleted

## 2019-02-23 LAB — COMPREHENSIVE METABOLIC PANEL
ALT: 8 U/L (ref 0–35)
AST: 12 U/L (ref 0–37)
Albumin: 3.8 g/dL (ref 3.5–5.2)
Alkaline Phosphatase: 56 U/L (ref 39–117)
BUN: 23 mg/dL (ref 6–23)
CO2: 28 mEq/L (ref 19–32)
Calcium: 8.8 mg/dL (ref 8.4–10.5)
Chloride: 109 mEq/L (ref 96–112)
Creatinine, Ser: 1.4 mg/dL — ABNORMAL HIGH (ref 0.40–1.20)
GFR: 35.25 mL/min — ABNORMAL LOW (ref >60.00)
Glucose, Bld: 106 mg/dL — ABNORMAL HIGH (ref 70–99)
Potassium: 4.9 mEq/L (ref 3.5–5.1)
Sodium: 143 mEq/L (ref 135–145)
Total Bilirubin: 0.4 mg/dL (ref 0.2–1.2)
Total Protein: 6.3 g/dL (ref 6.0–8.3)

## 2019-02-23 LAB — CBC WITH DIFFERENTIAL/PLATELET
Basophils Absolute: 0.1 10*3/uL (ref 0.0–0.1)
Basophils Relative: 1.2 % (ref 0.0–3.0)
Eosinophils Absolute: 0.1 10*3/uL (ref 0.0–0.7)
Eosinophils Relative: 0.9 % (ref 0.0–5.0)
HCT: 40.5 % (ref 36.0–46.0)
Hemoglobin: 13.5 g/dL (ref 12.0–15.0)
Lymphocytes Relative: 28.8 % (ref 12.0–46.0)
Lymphs Abs: 2.5 10*3/uL (ref 0.7–4.0)
MCHC: 33.4 g/dL (ref 30.0–36.0)
MCV: 94.9 fl (ref 78.0–100.0)
Monocytes Absolute: 0.6 10*3/uL (ref 0.1–1.0)
Monocytes Relative: 7.5 % (ref 3.0–12.0)
Neutro Abs: 5.3 10*3/uL (ref 1.4–7.7)
Neutrophils Relative %: 61.6 % (ref 43.0–77.0)
Platelets: 349 10*3/uL (ref 150.0–400.0)
RBC: 4.27 Mil/uL (ref 3.87–5.11)
RDW: 13.3 % (ref 11.5–15.5)
WBC: 8.6 10*3/uL (ref 4.0–10.5)

## 2019-02-23 LAB — TSH: TSH: 3.65 u[IU]/mL (ref 0.35–4.50)

## 2019-02-23 LAB — LIPID PANEL
Cholesterol: 229 mg/dL — ABNORMAL HIGH (ref 0–200)
HDL: 58.9 mg/dL (ref >39.00)
LDL Cholesterol: 154 mg/dL — ABNORMAL HIGH (ref 0–99)
NonHDL: 169.98
Total CHOL/HDL Ratio: 4
Triglycerides: 78 mg/dL (ref 0.0–149.0)
VLDL: 15.6 mg/dL (ref 0.0–40.0)

## 2019-02-23 LAB — VITAMIN D 25 HYDROXY (VIT D DEFICIENCY, FRACTURES): VITD: 20.35 ng/mL — ABNORMAL LOW (ref 30.00–100.00)

## 2019-02-23 NOTE — Telephone Encounter (Signed)
PA was approved, pharmacy notified and letter placed in Dr. Tower's inbox to sign and send for scanning  

## 2019-02-23 NOTE — Telephone Encounter (Signed)
PA for pt's amitriptyline was done at [email or web address].com, I will await a response

## 2019-03-23 ENCOUNTER — Other Ambulatory Visit: Payer: Self-pay | Admitting: Family Medicine

## 2019-08-10 MED ORDER — OXYCODONE HCL 5 MG PO TABS
5.00 | ORAL_TABLET | ORAL | Status: DC
Start: ? — End: 2019-08-10

## 2019-08-10 MED ORDER — CHOLECALCIFEROL 25 MCG (1000 UT) PO TABS
1000.00 | ORAL_TABLET | ORAL | Status: DC
Start: 2019-08-11 — End: 2019-08-10

## 2019-08-10 MED ORDER — ONDANSETRON HCL 4 MG/2ML IJ SOLN
4.00 | INTRAMUSCULAR | Status: DC
Start: ? — End: 2019-08-10

## 2019-08-10 MED ORDER — DEXTROMETHORPHAN-GUAIFENESIN 10-100 MG/5ML PO LIQD
5.00 | ORAL | Status: DC
Start: ? — End: 2019-08-10

## 2019-08-10 MED ORDER — ACETAMINOPHEN 325 MG PO TABS
650.00 | ORAL_TABLET | ORAL | Status: DC
Start: ? — End: 2019-08-10

## 2019-08-10 MED ORDER — BISACODYL 5 MG PO TBEC
10.00 | DELAYED_RELEASE_TABLET | ORAL | Status: DC
Start: ? — End: 2019-08-10

## 2019-08-10 MED ORDER — GENERIC EXTERNAL MEDICATION
12.50 | Status: DC
Start: 2019-08-10 — End: 2019-08-10

## 2019-08-10 MED ORDER — HEPARIN SODIUM (PORCINE) 5000 UNIT/ML IJ SOLN
5000.00 | INTRAMUSCULAR | Status: DC
Start: 2019-08-10 — End: 2019-08-10

## 2019-08-10 MED ORDER — DSS 100 MG PO CAPS
100.00 | ORAL_CAPSULE | ORAL | Status: DC
Start: ? — End: 2019-08-10

## 2019-08-10 MED ORDER — MELATONIN 3 MG PO TABS
3.00 | ORAL_TABLET | ORAL | Status: DC
Start: ? — End: 2019-08-10

## 2019-08-10 MED ORDER — AMITRIPTYLINE HCL 25 MG PO TABS
25.00 | ORAL_TABLET | ORAL | Status: DC
Start: 2019-08-10 — End: 2019-08-10

## 2019-08-12 ENCOUNTER — Telehealth: Payer: Self-pay

## 2019-08-12 NOTE — Telephone Encounter (Signed)
Heather with [facility] left v/m that pt was recently d/c from hospital and a referral was put in for [facility] for nursing and therapy services; Herbert Seta said that pt refuses all HH services. Pt is aware to contact [facility] if she changes her mind. FYI to Dr Milinda Antis.

## 2019-08-12 NOTE — Telephone Encounter (Signed)
Aware, thanks!

## 2020-02-06 ENCOUNTER — Other Ambulatory Visit: Payer: Self-pay | Admitting: Family Medicine

## 2020-02-07 NOTE — Telephone Encounter (Signed)
No recent or future appts., please advise  

## 2020-02-07 NOTE — Telephone Encounter (Signed)
Please schedule f/u in nov or dec

## 2020-02-07 NOTE — Telephone Encounter (Signed)
Please schedule f/u in nov or dec and refill until then

## 2020-02-08 NOTE — Telephone Encounter (Signed)
Med refilled once and Carrie will reach out to try and get appt scheduled  

## 2020-02-09 ENCOUNTER — Encounter: Payer: Self-pay | Admitting: *Deleted

## 2020-02-22 ENCOUNTER — Other Ambulatory Visit: Payer: Self-pay | Admitting: Family Medicine

## 2020-03-07 ENCOUNTER — Other Ambulatory Visit: Payer: Self-pay | Admitting: Family Medicine

## 2020-03-07 NOTE — Telephone Encounter (Signed)
See 10/28 letter, we have tried to call and also mailed a letter to pt to try and get her scheduled for a f/u or CPE, pt hasn't made an appt at this time will deny refills until pt make appt

## 2020-05-12 ENCOUNTER — Other Ambulatory Visit: Payer: Self-pay | Admitting: Family Medicine

## 2020-05-14 NOTE — Telephone Encounter (Signed)
Called and found out that patient has a new doctor in [facility] and does not come here anymore. Advised to let the pharmacy know because we were still getting refills requests. Took Dr Royden Purl name off patient's PCP list. RX declined.

## 2021-07-11 ENCOUNTER — Inpatient Hospital Stay (HOSPITAL_COMMUNITY)
Admission: EM | Admit: 2021-07-11 | Discharge: 2021-07-14 | DRG: 377 | Disposition: A | Discharge status: Home / Self Care | Payer: Medicare PPO | Attending: Internal Medicine | Admitting: Internal Medicine

## 2021-07-11 ENCOUNTER — Emergency Department (HOSPITAL_COMMUNITY): Payer: Medicare PPO

## 2021-07-11 ENCOUNTER — Encounter (HOSPITAL_COMMUNITY): Payer: Self-pay

## 2021-07-11 ENCOUNTER — Other Ambulatory Visit: Payer: Self-pay

## 2021-07-11 DIAGNOSIS — K449 Diaphragmatic hernia without obstruction or gangrene: Secondary | ICD-10-CM | POA: Diagnosis present

## 2021-07-11 DIAGNOSIS — E43 Unspecified severe protein-calorie malnutrition: Secondary | ICD-10-CM | POA: Diagnosis present

## 2021-07-11 DIAGNOSIS — K920 Hematemesis: Secondary | ICD-10-CM | POA: Diagnosis not present

## 2021-07-11 DIAGNOSIS — L89322 Pressure ulcer of left buttock, stage 2: Secondary | ICD-10-CM | POA: Diagnosis present

## 2021-07-11 DIAGNOSIS — K254 Chronic or unspecified gastric ulcer with hemorrhage: Secondary | ICD-10-CM | POA: Diagnosis not present

## 2021-07-11 DIAGNOSIS — Z833 Family history of diabetes mellitus: Secondary | ICD-10-CM

## 2021-07-11 DIAGNOSIS — G8929 Other chronic pain: Secondary | ICD-10-CM | POA: Diagnosis present

## 2021-07-11 DIAGNOSIS — R471 Dysarthria and anarthria: Secondary | ICD-10-CM

## 2021-07-11 DIAGNOSIS — M810 Age-related osteoporosis without current pathological fracture: Secondary | ICD-10-CM | POA: Diagnosis present

## 2021-07-11 DIAGNOSIS — N1832 Chronic kidney disease, stage 3b: Secondary | ICD-10-CM | POA: Diagnosis present

## 2021-07-11 DIAGNOSIS — Z20822 Contact with and (suspected) exposure to covid-19: Secondary | ICD-10-CM | POA: Diagnosis present

## 2021-07-11 DIAGNOSIS — Z8679 Personal history of other diseases of the circulatory system: Secondary | ICD-10-CM

## 2021-07-11 DIAGNOSIS — K3189 Other diseases of stomach and duodenum: Secondary | ICD-10-CM | POA: Diagnosis present

## 2021-07-11 DIAGNOSIS — K2961 Other gastritis with bleeding: Secondary | ICD-10-CM | POA: Diagnosis present

## 2021-07-11 DIAGNOSIS — Z90710 Acquired absence of both cervix and uterus: Secondary | ICD-10-CM

## 2021-07-11 DIAGNOSIS — M25559 Pain in unspecified hip: Secondary | ICD-10-CM | POA: Diagnosis present

## 2021-07-11 DIAGNOSIS — I129 Hypertensive chronic kidney disease with stage 1 through stage 4 chronic kidney disease, or unspecified chronic kidney disease: Secondary | ICD-10-CM | POA: Diagnosis present

## 2021-07-11 DIAGNOSIS — N183 Chronic kidney disease, stage 3 unspecified: Secondary | ICD-10-CM | POA: Diagnosis present

## 2021-07-11 DIAGNOSIS — Z681 Body mass index (BMI) 19 or less, adult: Secondary | ICD-10-CM

## 2021-07-11 DIAGNOSIS — R195 Other fecal abnormalities: Secondary | ICD-10-CM

## 2021-07-11 DIAGNOSIS — K921 Melena: Secondary | ICD-10-CM | POA: Diagnosis not present

## 2021-07-11 DIAGNOSIS — K269 Duodenal ulcer, unspecified as acute or chronic, without hemorrhage or perforation: Secondary | ICD-10-CM | POA: Diagnosis present

## 2021-07-11 DIAGNOSIS — D72828 Other elevated white blood cell count: Secondary | ICD-10-CM | POA: Diagnosis present

## 2021-07-11 DIAGNOSIS — M160 Bilateral primary osteoarthritis of hip: Secondary | ICD-10-CM | POA: Diagnosis present

## 2021-07-11 DIAGNOSIS — K922 Gastrointestinal hemorrhage, unspecified: Secondary | ICD-10-CM | POA: Diagnosis not present

## 2021-07-11 DIAGNOSIS — N1330 Unspecified hydronephrosis: Secondary | ICD-10-CM | POA: Diagnosis present

## 2021-07-11 DIAGNOSIS — R799 Abnormal finding of blood chemistry, unspecified: Secondary | ICD-10-CM

## 2021-07-11 DIAGNOSIS — Z8249 Family history of ischemic heart disease and other diseases of the circulatory system: Secondary | ICD-10-CM

## 2021-07-11 DIAGNOSIS — D62 Acute posthemorrhagic anemia: Secondary | ICD-10-CM | POA: Diagnosis present

## 2021-07-11 DIAGNOSIS — E559 Vitamin D deficiency, unspecified: Secondary | ICD-10-CM | POA: Diagnosis present

## 2021-07-11 DIAGNOSIS — E7800 Pure hypercholesterolemia, unspecified: Secondary | ICD-10-CM | POA: Diagnosis present

## 2021-07-11 DIAGNOSIS — Z808 Family history of malignant neoplasm of other organs or systems: Secondary | ICD-10-CM

## 2021-07-11 LAB — DIFFERENTIAL
Abs Immature Granulocytes: 0.16 10*3/uL — ABNORMAL HIGH (ref 0.00–0.07)
Basophils Absolute: 0.1 10*3/uL (ref 0.0–0.1)
Basophils Relative: 0 %
Eosinophils Absolute: 0.2 10*3/uL (ref 0.0–0.5)
Eosinophils Relative: 1 %
Immature Granulocytes: 1 %
Lymphocytes Relative: 16 %
Lymphs Abs: 2.6 10*3/uL (ref 0.7–4.0)
Monocytes Absolute: 0.7 10*3/uL (ref 0.1–1.0)
Monocytes Relative: 5 %
Neutro Abs: 12.4 10*3/uL — ABNORMAL HIGH (ref 1.7–7.7)
Neutrophils Relative %: 77 %

## 2021-07-11 LAB — CBC
HCT: 34.2 % — ABNORMAL LOW (ref 36.0–46.0)
Hemoglobin: 11.1 g/dL — ABNORMAL LOW (ref 12.0–15.0)
MCH: 30.2 pg (ref 26.0–34.0)
MCHC: 32.5 g/dL (ref 30.0–36.0)
MCV: 92.9 fL (ref 80.0–100.0)
Platelets: 455 10*3/uL — ABNORMAL HIGH (ref 150–400)
RBC: 3.68 MIL/uL — ABNORMAL LOW (ref 3.87–5.11)
RDW: 14.1 % (ref 11.5–15.5)
WBC: 16.1 10*3/uL — ABNORMAL HIGH (ref 4.0–10.5)
nRBC: 0 % (ref 0.0–0.2)

## 2021-07-11 LAB — I-STAT CHEM 8, ED
BUN: 40 mg/dL — ABNORMAL HIGH (ref 8–23)
Calcium, Ion: 1.1 mmol/L — ABNORMAL LOW (ref 1.15–1.40)
Chloride: 109 mmol/L (ref 98–111)
Creatinine, Ser: 1.1 mg/dL — ABNORMAL HIGH (ref 0.44–1.00)
Glucose, Bld: 122 mg/dL — ABNORMAL HIGH (ref 70–99)
HCT: 33 % — ABNORMAL LOW (ref 36.0–46.0)
Hemoglobin: 11.2 g/dL — ABNORMAL LOW (ref 12.0–15.0)
Potassium: 4.4 mmol/L (ref 3.5–5.1)
Sodium: 139 mmol/L (ref 135–145)
TCO2: 21 mmol/L — ABNORMAL LOW (ref 22–32)

## 2021-07-11 LAB — PROTIME-INR
INR: 1.1 (ref 0.8–1.2)
Prothrombin Time: 14.4 seconds (ref 11.4–15.2)

## 2021-07-11 LAB — ETHANOL: Alcohol, Ethyl (B): <10 mg/dL (ref <10)

## 2021-07-11 LAB — POC OCCULT BLOOD, ED: Fecal Occult Bld: POSITIVE — AB

## 2021-07-11 LAB — COMPREHENSIVE METABOLIC PANEL
ALT: 10 U/L (ref 0–44)
AST: 13 U/L — ABNORMAL LOW (ref 15–41)
Albumin: 2.4 g/dL — ABNORMAL LOW (ref 3.5–5.0)
Alkaline Phosphatase: 76 U/L (ref 38–126)
Anion gap: 9 (ref 5–15)
BUN: 41 mg/dL — ABNORMAL HIGH (ref 8–23)
CO2: 19 mmol/L — ABNORMAL LOW (ref 22–32)
Calcium: 7.7 mg/dL — ABNORMAL LOW (ref 8.9–10.3)
Chloride: 111 mmol/L (ref 98–111)
Creatinine, Ser: 1.14 mg/dL — ABNORMAL HIGH (ref 0.44–1.00)
GFR, Estimated: 45 mL/min — ABNORMAL LOW (ref >60)
Glucose, Bld: 126 mg/dL — ABNORMAL HIGH (ref 70–99)
Potassium: 4.4 mmol/L (ref 3.5–5.1)
Sodium: 139 mmol/L (ref 135–145)
Total Bilirubin: 0.5 mg/dL (ref 0.3–1.2)
Total Protein: 5 g/dL — ABNORMAL LOW (ref 6.5–8.1)

## 2021-07-11 LAB — RESP PANEL BY RT-PCR (FLU A&B, COVID) ARPGX2
Influenza A by PCR: NEGATIVE
Influenza B by PCR: NEGATIVE
SARS Coronavirus 2 by RT PCR: NEGATIVE

## 2021-07-11 LAB — TSH: TSH: 0.954 u[IU]/mL (ref 0.350–4.500)

## 2021-07-11 LAB — CBG MONITORING, ED: Glucose-Capillary: 120 mg/dL — ABNORMAL HIGH (ref 70–99)

## 2021-07-11 LAB — HEMOGLOBIN AND HEMATOCRIT, BLOOD
HCT: 29.8 % — ABNORMAL LOW (ref 36.0–46.0)
Hemoglobin: 9.9 g/dL — ABNORMAL LOW (ref 12.0–15.0)

## 2021-07-11 LAB — APTT: aPTT: 25 seconds (ref 24–36)

## 2021-07-11 MED ORDER — METOPROLOL SUCCINATE ER 25 MG PO TB24: 50.0000 mg | ORAL_TABLET | Freq: Every day | ORAL | Status: DC

## 2021-07-11 MED ORDER — PANTOPRAZOLE INFUSION (NEW) - SIMPLE MED
8.0000 mg/h | INTRAVENOUS | Status: DC
Start: 2021-07-11 — End: 2021-07-13
  Administered 2021-07-11 – 2021-07-13 (×5): 8 mg/h via INTRAVENOUS
  Filled 2021-07-11 (×2): qty 80
  Filled 2021-07-11 (×2): qty 100
  Filled 2021-07-11 (×2): qty 80
  Filled 2021-07-11: qty 100

## 2021-07-11 MED ORDER — METOPROLOL SUCCINATE ER 50 MG PO TB24
50.0000 mg | ORAL_TABLET | Freq: Every day | ORAL | Status: DC
  Administered 2021-07-11 – 2021-07-14 (×4): 50 mg via ORAL
  Filled 2021-07-11 (×2): qty 1
  Filled 2021-07-11: qty 2
  Filled 2021-07-11: qty 1

## 2021-07-11 MED ORDER — PANTOPRAZOLE 80MG IVPB - SIMPLE MED
80.0000 mg | Freq: Once | INTRAVENOUS | Status: AC
  Administered 2021-07-11: 80 mg via INTRAVENOUS
  Filled 2021-07-11: qty 80

## 2021-07-11 MED ORDER — PANTOPRAZOLE SODIUM 40 MG IV SOLR: 40.0000 mg | Freq: Two times a day (BID) | INTRAVENOUS | Status: DC

## 2021-07-11 MED ORDER — SODIUM CHLORIDE 0.9 % IV SOLN: INTRAVENOUS | Status: AC

## 2021-07-11 MED ORDER — IOHEXOL 350 MG/ML SOLN
100.0000 mL | Freq: Once | INTRAVENOUS | Status: AC | PRN
  Administered 2021-07-11: 100 mL via INTRAVENOUS

## 2021-07-11 MED ORDER — AMLODIPINE BESYLATE 5 MG PO TABS
5.0000 mg | ORAL_TABLET | Freq: Every day | ORAL | Status: DC
Start: 2021-07-11 — End: 2021-07-14
  Administered 2021-07-11 – 2021-07-14 (×4): 5 mg via ORAL
  Filled 2021-07-11 (×4): qty 1

## 2021-07-11 MED ORDER — AMLODIPINE BESYLATE 5 MG PO TABS: 5.0000 mg | ORAL_TABLET | Freq: Every day | ORAL | Status: DC

## 2021-07-11 MED ORDER — ACETAMINOPHEN 325 MG PO TABS
650.0000 mg | ORAL_TABLET | Freq: Four times a day (QID) | ORAL | Status: DC | PRN
  Administered 2021-07-12: 650 mg via ORAL
  Filled 2021-07-11: qty 2

## 2021-07-11 MED ORDER — ACETAMINOPHEN 650 MG RE SUPP: 650.0000 mg | Freq: Four times a day (QID) | RECTAL | Status: DC | PRN

## 2021-07-11 MED ORDER — BOOST / RESOURCE BREEZE PO LIQD CUSTOM
1.0000 | Freq: Three times a day (TID) | ORAL | Status: DC
  Administered 2021-07-11 – 2021-07-12 (×2): 1 via ORAL

## 2021-07-11 NOTE — Hospital Course (Addendum)
3/31: Patient's two friends/neighbors at bedside, leave the room for privacy during eval. This morning after EGD, she feels about the same, has abdominal pain in the same place as before. Denies nausea, no further vomiting. Discussed presence of the GI ulcers causing anemia. Discussed starting liquid diet. Patient does endorse a bit of an appetite. Has not yet tried using the restroom since EGD this morning. Discussed patient may still see dark stools the next few days as the ulcers heal. ? ?Patient reports she cannot walk at home but then states she can ambulate with a walker. Denies pain at skin ulcer site on left buttock. She has been spending most of her time sedentary. Patient is not sure if she is getting enough care at home. Patient reports she cannot live with Victoria Ball. Also states she cannot go back with Victoria Ball, states she is not sure why. States thinks Victoria Ball doesn't want her back to live with her. Answers "no" when asked if she feels safe at home. Discussed needing good plan for safe discharge with patient, eval with PT. ? ?Collateral: ?Called daughter Victoria Ball. This morning, patient asked for help walking, sat down on the carpet, eyes rolled back, bloody emesis or nosebleed. No emetic episodes yesterday. Has had complaints of nausea three days ago x 1, but none since. BM 1x/day, black stools, bright red blood on toilet paper. Hip pain. Always with decreased appetite, anorexic. Has eaten well over the past couple of days. Drinking half a glass of water and 1 small can of Pepsi daily. ?Reviewed medications: Metoprolol and amlodipine only. Took half of an amitriptyline 3 days ago for difficulty sleeping. Ran out of medication at that time. Occasional Tylenol for pain. Daughter is not aware of Naproxen use. ?PCP: [facility] x 4 years, discharged 2 weeks ago, no pain meds since then. Discharged for suspicion of taking too many oxycodones (Rx 4x per day, running out ahead of time). ?Last oxy use: 1 month ago.  ? ? ?CKD  IIIB ?Chronic pain -- relationship with pain clinic ended earlier this month ?HTN, hx of hypertensive emergency ?Opiate use disorder, hx of opiate withdrawal ?Senile dementia ?Frail elderly ?Prediabetes ?Seborrheic dermatitis of scalp ?Vitamin D deficiency / osteoporosis (stopped raloxifene after 5 years) ? ?Oxycodone 10mg  q6h prn ?Oxycodone-acetaminophen 10-325mg  q6h prn??? May have been switched to oxy alone ?Narcan ?Atorvastatin 40mg  ?Trazodone 50mg  qhs ?Ketoconazole shampoo ?Metoprolol-XR 50mg  daily ?Amlodipine 5mg  daily ?Telmisartan 40mg  daily ? ? ?GIB, black blood in emesis, melena in underwear ?Mildly tachycardic ?Initially hypotensive, responded to fluids. ? ?Initially code stroke b/c slight dysarthria per family, cleared by Neuro. ? ? ? ? ? ?

## 2021-07-11 NOTE — ED Provider Notes (Signed)
?[facility] ?Provider Note ? ? ?CSN: [id number] ?Arrival date & time: 07/11/21  [id number] ? ?  ? ?History ? ?No chief complaint on file. ? ? ?Victoria Ball is a 88 y.o. female brought in by EMS initially as a code stroke.  Apparently the patient was sitting on the toilet today and had 1 episode of dark emesis.  The family called EMS and felt like the patient was somewhat dysarthric.  EMS was has not to call a code stroke however family insisted and patient came in as a code stroke.  Patient seen by the neurology team at the bridge.  During evaluation the patient became mildly hypotensive then rebounded.  She was noted to have dried black emesis around her nares and mouth.  Patient denies abdominal pain. ? ?HPI ? ?  ? ?Home Medications ?Prior to Admission medications   ?Medication Sig Start Date End Date Taking? Authorizing Provider  ?amitriptyline (ELAVIL) 25 MG tablet TAKE 1 TABLET BY MOUTH EVERYDAY AT BEDTIME 02/08/20   Tower, Meridian A, MD  ?amLODipine (NORVASC) 5 MG tablet TAKE 1 TABLET BY MOUTH EVERY DAY 02/22/20   Tower, Wynelle Fanny, MD  ?cholecalciferol (VITAMIN D3) 25 MCG (1000 UT) tablet Take 4,000 Units by mouth daily.    [provider]  ?ketoconazole (NIZORAL) 2 % shampoo Apply 1 application topically 2 (two) times a week. Shampoo scalp twice weekly and leave on several minutes before rinsing 02/24/19   Tower, [place] A, MD  ?lisinopril (ZESTRIL) 10 MG tablet Take 1 tablet (10 mg total) by mouth daily. 02/22/19   Tower, Wynelle Fanny, MD  ?oxyCODONE-acetaminophen (PERCOCET) 10-325 MG tablet Take 1 tablet by mouth every 6 (six) hours as needed for pain.    [provider]  ?   ? ?Allergies    ?Hydrochlorothiazide   ? ?Review of Systems   ?Review of Systems ? ?Physical Exam ?Updated Vital Signs ?Ht 5\' 4"  (1.626 m)   Wt 42.2 kg   LMP 04/14/1977   BMI 15.97 kg/m?  ?Physical Exam ?Vitals and nursing note reviewed.  ?Constitutional:   ?   General: She is not in acute  distress. ?   Appearance: She is well-developed. She is ill-appearing. She is not diaphoretic.  ?HENT:  ?   Head: Normocephalic and atraumatic.  ?   Right Ear: External ear normal.  ?   Left Ear: External ear normal.  ?   Nose: Nose normal.  ?   Mouth/Throat:  ?   Mouth: Mucous membranes are dry.  ?   Comments: Black emesis crusted around the nares and mouth ?Eyes:  ?   General: No scleral icterus. ?   Conjunctiva/sclera: Conjunctivae normal.  ?Cardiovascular:  ?   Rate and Rhythm: Normal rate and regular rhythm.  ?   Heart sounds: Normal heart sounds. No murmur heard. ?  No friction rub. No gallop.  ?Pulmonary:  ?   Effort: Pulmonary effort is normal. No respiratory distress.  ?   Breath sounds: Normal breath sounds.  ?Abdominal:  ?   General: Bowel sounds are normal. There is no distension.  ?   Palpations: Abdomen is soft. There is no mass.  ?   Tenderness: There is no abdominal tenderness. There is no guarding.  ?Genitourinary: ?   Comments: Patient's underwear is soiled with melanotic appearing stool.  Hemoccult collected. ?She has a small stage II sacral ulcer. ?Musculoskeletal:  ?   Cervical back: Normal range of motion.  ?Skin: ?  General: Skin is warm and dry.  ?Neurological:  ?   Mental Status: She is alert and oriented to person, place, and time.  ?Psychiatric:     ?   Behavior: Behavior normal.  ? ? ?ED Results / Procedures / Treatments   ?Labs ?(all labs ordered are listed, but only abnormal results are displayed) ?Labs Reviewed  ?I-STAT CHEM 8, ED - Abnormal; Notable for the following components:  ?    Result Value  ? BUN 40 (*)   ? Creatinine, Ser 1.10 (*)   ? Glucose, Bld 122 (*)   ? Calcium, Ion 1.10 (*)   ? TCO2 21 (*)   ? Hemoglobin 11.2 (*)   ? HCT 33.0 (*)   ? All other components within normal limits  ?RESP PANEL BY RT-PCR (FLU A&B, COVID) [id number]  ?ETHANOL  ?PROTIME-INR  ?APTT  ?CBC  ?DIFFERENTIAL  ?COMPREHENSIVE METABOLIC PANEL  ?RAPID URINE DRUG SCREEN, HOSP PERFORMED  ?URINALYSIS, ROUTINE W  REFLEX MICROSCOPIC  ?TYPE AND SCREEN  ? ? ?EKG ?None ? ?Radiology ?CT HEAD CODE STROKE WO CONTRAST ? ?Result Date: 07/11/2021 ?CLINICAL DATA:  Code stroke. 88 year old female with abnormal speech. EXAM: CT HEAD WITHOUT CONTRAST TECHNIQUE: Contiguous axial images were obtained from the base of the skull through the vertex without intravenous contrast. RADIATION DOSE REDUCTION: This exam was performed according to the departmental dose-optimization program which includes automated exposure control, adjustment of the mA and/or kV according to patient size and/or use of iterative reconstruction technique. COMPARISON:  06/27/2015 head CT. FINDINGS: Brain: Cerebral volume is within normal limits for age. No midline shift, mass effect, or evidence of intracranial mass lesion. No ventriculomegaly. No acute intracranial hemorrhage identified. Chronic asymmetric heterogeneity of the right basal ganglia. Bilateral cerebral white matter hypodensity has progressed since 2017, including bilateral deep white matter capsule involvement. No cortically based acute infarct identified. Vascular: Calcified atherosclerosis at the skull base. No suspicious intracranial vascular hyperdensity. Skull: No acute osseous abnormality identified. Sinuses/Orbits: Paranasal Visualized paranasal sinuses and mastoids are clear. Other: No acute orbit or scalp soft tissue finding. ASPECTS [facility] Stroke Program Early CT Score) Total score (0-10 with 10 being normal): 10 IMPRESSION: 1. No acute cortically based infarct or acute intracranial hemorrhage identified. ASPECTS 10. 2. Chronic small vessel disease with progression since 2017. 3. These results were communicated to Dr. Quinn Axe at 9:45 am on 07/11/2021 by text page via the [facility] messaging system. Electronically Signed   By: Genevie Ann M.D.   On: 07/11/2021 09:47   ? ?Procedures ?Procedures  ? ? ?Medications Ordered in ED ?Medications  ?pantoprazole (PROTONIX) 80 mg /NS 100 mL IVPB (has no administration  in time range)  ?pantoprozole (PROTONIX) 80 mg /NS 100 mL infusion (has no administration in time range)  ?pantoprazole (PROTONIX) injection 40 mg (has no administration in time range)  ? ? ?ED Course/ Medical Decision Making/ A&P ?Clinical Course as of 07/11/21 1624  ?Thu Jul 11, 2021  ?1000 CT HEAD CODE STROKE WO CONTRAST ?I visualized the head CT which shows no acute findings.  Code stroke discontinued by Dr. Su Monks [AH]  ?1000 I-stat chem 8, ED(!) ?Creatinine is not significantly elevated.  We will proceed with CT abdomen and pelvis angiogram [AH]  ?1231 Case discussed with Dr. Benson Norway- He states that the patient sees [facility]  but will contact them directly to see the patient. ? [AH]  ?1253 Case discussed with Dr. Allyson Sabal of IM who will admit the patient. [AH]  ?1253 Comprehensive  metabolic panel(!) [AH]  ?1253 Creatinine(!): 1.14 [AH]  ?1253 Calcium(!): 7.7 [AH]  ?1253 Differential(!) [AH]  ?1253 CBC(!) [AH]  ?1253 WBC(!): 16.1 [AH]  ?1253 Resp Panel by RT-PCR (Flu A&B, Covid) Nasopharyngeal Swab [AH]  ?1253 Fecal Occult Blood, POC(!): POSITIVE [AH]  ?  ?Clinical Course User Index ?[AH] Margarita Mail, PA-C  ? ?                        ?Medical Decision Making ?Patient here with Hypotension and momentary slurred speech.  ?Code stroke initatted and cancelled. ?Patient has active GI bleed.  ?She is receiving IV protonix and fluids. ?Patient has been stable here without hypotension.  No repeat episodes of hematemesis or melena.  Case discussed with GI and internal medicine teaching service for admission. ? ?Amount and/or Complexity of Data Reviewed ?Labs: ordered. Decision-making details documented in ED Course. ?Radiology: ordered and independent interpretation performed. Decision-making details documented in ED Course. ? ?Risk ?Prescription drug management. ?Decision regarding hospitalization. ? ? ? ?Final Clinical Impression(s) / ED Diagnoses ?Final diagnoses:  ?Gastrointestinal hemorrhage, unspecified  gastrointestinal hemorrhage type  ? ? ?Rx / DC Orders ?ED Discharge Orders   ? ? None  ? ?  ? ? ?  ?Margarita Mail, PA-C ?07/11/21 1636 ? ?  ?Lianne Cure, DO ?[id number] [phone] ? ?

## 2021-07-11 NOTE — ED Notes (Signed)
Patient transported to CT 

## 2021-07-11 NOTE — H&P (View-Only) (Signed)
? ? ?Consultation ? ?Referring Provider: Medicine service/Machen MD ?Primary Gastroenterologist:  Dr. Lesia Sago ? ?Reason for Consultation: GI bleed, coffee-ground emesis ? ?HPI: Victoria Ball is a 88 y.o. female, with history of prior subdural hematoma, osteoporosis, hypertension and arthritis, who was brought to the emergency room this morning via EMS after she was found on the floor in the bathroom by her daughter a bit confused, with slurred speech and black material on her face felt to be consistent with coffee-ground emesis. ?On further questioning patient said that she had been nauseated a few days ago, had not vomited.  She has no ongoing complaints of abdominal pain, does remember having bright red blood and dark blood with her stool yesterday x2, has not had a bowel movement today No complaints of heartburn or indigestion, no chest pain no shortness of breath.  No dysphagia.  Appetite never good but no recent changes ?Has been taking an over-the-counter arthritis medication, not clear whether this is Tylenol or an NSAID usually 2/day ? ?Prior history of GI bleeding.  No prior EGD. ?No blood thinners ?Note colonoscopy 2004 for screening was negative ? ?She has been hemodynamically stable in the emergency room ?Initial hemoglobin 11.1/hematocrit 34.2 WBC 16.1, platelets 455 ?BUN 41/creatinine 1.14 ?LFTs within normal limits ?INR 1.1, stool documented heme positive ? ? ? ? ? ? ?Past Medical History:  ?Diagnosis Date  ? Arthritis   ? History of central retinal vein occlusion   ? HLD (hyperlipidemia)   ? Hypertension   ? OP (osteoporosis)   ? stopped Evista after 5 years  ? Subdural hematoma, acute 05/15/2015  ? left; "rolled off couch and hit head"  ? ? ?Past Surgical History:  ?Procedure Laterality Date  ? ABDOMINAL HYSTERECTOMY  1994  ? fibroid tumor  ? CATARACT EXTRACTION W/ INTRAOCULAR LENS  IMPLANT, BILATERAL Bilateral   ? ? ?Prior to Admission medications   ?Medication Sig Start Date End Date Taking?  Authorizing Provider  ?Acetaminophen (TYLENOL PO) Take 2 tablets by mouth daily as needed (pain).   Yes [provider]  ?amLODipine (NORVASC) 5 MG tablet TAKE 1 TABLET BY MOUTH EVERY DAY ?Patient taking differently: Take 5 mg by mouth daily. 02/22/20  Yes Tower, Audrie Gallus, MD  ?ketoconazole (NIZORAL) 2 % shampoo Apply 1 application topically 2 (two) times a week. Shampoo scalp twice weekly and leave on several minutes before rinsing ?Patient taking differently: Apply 1 application. topically 2 (two) times a week. 02/24/19  Yes Tower, Audrie Gallus, MD  ?amitriptyline (ELAVIL) 25 MG tablet TAKE 1 TABLET BY MOUTH EVERYDAY AT BEDTIME ?Patient taking differently: Take 25 mg by mouth at bedtime. 02/08/20   Tower, Audrie Gallus, MD  ?lisinopril (ZESTRIL) 10 MG tablet Take 1 tablet (10 mg total) by mouth daily. ?Patient not taking: Reported on 07/11/2021 02/22/19   Tower, Audrie Gallus, MD  ?metoprolol succinate (TOPROL-XL) 50 MG 24 hr tablet Take 50 mg by mouth daily. ?Patient not taking: Reported on 07/11/2021 06/30/21   [provider]  ? ? ?Current Facility-Administered Medications  ?Medication Dose Route Frequency Provider Last Rate Last Admin  ? acetaminophen (TYLENOL) tablet 650 mg  650 mg Oral Q6H PRN Merrilyn Puma, MD      ? Or  ? acetaminophen (TYLENOL) suppository 650 mg  650 mg Rectal Q6H PRN Merrilyn Puma, MD      ? Melene Muller ON 07/14/2021] pantoprazole (PROTONIX) injection 40 mg  40 mg Intravenous Q12H Edwin Dada P, DO      ?  pantoprozole (PROTONIX) 80 mg /NS 100 mL infusion  8 mg/hr Intravenous Continuous Gray, Alicia P, DO 10 mL/hr at 07/11/21 1017 8 mg/hr at 07/11/21 1017  ? ?Current Outpatient Medications  ?Medication Sig Dispense Refill  ? Acetaminophen (TYLENOL PO) Take 2 tablets by mouth daily as needed (pain).    ? amLODipine (NORVASC) 5 MG tablet TAKE 1 TABLET BY MOUTH EVERY DAY (Patient taking differently: Take 5 mg by mouth daily.) 90 tablet 0  ? ketoconazole (NIZORAL) 2 % shampoo Apply 1 application  topically 2 (two) times a week. Shampoo scalp twice weekly and leave on several minutes before rinsing (Patient taking differently: Apply 1 application. topically 2 (two) times a week.) 120 mL 3  ? amitriptyline (ELAVIL) 25 MG tablet TAKE 1 TABLET BY MOUTH EVERYDAY AT BEDTIME (Patient taking differently: Take 25 mg by mouth at bedtime.) 30 tablet 0  ? lisinopril (ZESTRIL) 10 MG tablet Take 1 tablet (10 mg total) by mouth daily. (Patient not taking: Reported on 07/11/2021) 90 tablet 3  ? metoprolol succinate (TOPROL-XL) 50 MG 24 hr tablet Take 50 mg by mouth daily. (Patient not taking: Reported on 07/11/2021)    ? ? ?Allergies as of 07/11/2021 - Review Complete 07/11/2021  ?Allergen Reaction Noted  ? Hydrochlorothiazide  10/27/2016  ? ? ?Family History  ?Problem Relation Age of Onset  ? Coronary artery disease Father   ? Diabetes Father   ?     ?   ? Heart attack Sister 70  ? Coronary artery disease Brother   ?     CABG  ? Coronary artery disease Brother   ?     CABG  ? Coronary artery disease Brother   ?     CABG  ? Angelman syndrome Paternal Grandfather   ? ? ?Social History  ? ?Socioeconomic History  ? Marital status: Widowed  ?  Spouse name: Not on file  ? Number of children: Not on file  ? Years of education: Not on file  ? Highest education level: Not on file  ?Occupational History  ? Occupation: Retired  ?Tobacco Use  ? Smoking status: Passive Smoke Exposure - Never Smoker  ? Smokeless tobacco: Never  ?Substance and Sexual Activity  ? Alcohol use: No  ?  Alcohol/week: 0.0 standard drinks  ? Drug use: No  ? Sexual activity: Not Currently  ?Other Topics Concern  ? Not on file  ?Social History Narrative  ? Not on file  ? ?Social Determinants of Health  ? ?Financial Resource Strain: Not on file  ?Food Insecurity: Not on file  ?Transportation Needs: Not on file  ?Physical Activity: Not on file  ?Stress: Not on file  ?Social Connections: Not on file  ?Intimate Partner Violence: Not on file  ? ? ?Review of  Systems: ?Pertinent positive and negative review of systems were noted in the above HPI section.  All other review of systems was otherwise negative.  ? ?Physical Exam: ?Vital signs in last 24 hours: ?Pulse Rate:  [30-116] 116 (03/30 1400) ?Resp:  [14-35] 17 (03/30 1400) ?BP: (103-161)/(59-95) 144/95 (03/30 1400) ?SpO2:  [93 %-100 %] 98 % (03/30 1400) ?Weight:  [42.2 kg] 42.2 kg (03/30 0900) ?  ?General:   Alert,  Well-developed, frail-appearing elderly white female ?Head:  Normocephalic and atraumatic. ?Eyes:  Sclera clear, no icterus.   Conjunctiva pink. ?Ears:  Normal auditory acuity. ?Nose:  No deformity, discharge,  or lesions. ?Mouth:  No deformity or lesions.   ?Neck:  Supple; no masses   or thyromegaly. ?Lungs:  Clear throughout to auscultation.   No wheezes, crackles, or rhonchi. ? Heart:  Regular rate and rhythm; no murmurs, clicks, rubs,  or gallops. ?Abdomen:  Soft,nontender, BS active,nonpalp mass or hsm.  ?Rectal: Not done, documented heme positive ?Msk:  Symmetrical without gross deformities. Marland Kitchen ?Pulses:  Normal pulses noted. ?Extremities:  Without clubbing or edema. ?Neurologic:  Alert and  oriented x4;  grossly normal neurologically. ?Skin:  Intact without significant lesions or rashes.Marland Kitchen ?Psych:  Alert and cooperative. Normal mood and affect. ? ?Intake/Output from previous day: ?No intake/output data recorded. ?Intake/Output this shift: ?No intake/output data recorded. ? ?Lab Results: ?Recent Labs  ?  07/11/21 ?5784 07/11/21 ?6962  ?WBC 16.1*  --   ?HGB 11.1* 11.2*  ?HCT 34.2* 33.0*  ?PLT 455*  --   ? ?BMET ?Recent Labs  ?  07/11/21 ?9528 07/11/21 ?4132  ?NA 139 139  ?K 4.4 4.4  ?CL 111 109  ?CO2 19*  --   ?GLUCOSE 126* 122*  ?BUN 41* 40*  ?CREATININE 1.14* 1.10*  ?CALCIUM 7.7*  --   ? ?LFT ?Recent Labs  ?  07/11/21 ?4401  ?PROT 5.0*  ?ALBUMIN 2.4*  ?AST 13*  ?ALT 10  ?ALKPHOS 76  ?BILITOT 0.5  ? ?PT/INR ?Recent Labs  ?  07/11/21 ?1006  ?LABPROT 14.4  ?INR 1.1  ? ?Hepatitis Panel ?No results for  input(s): HEPBSAG, HCVAB, HEPAIGM, HEPBIGM in the last 72 hours. ? ? ? ?IMPRESSION:  ?#5 88 year old white female with acute upper GI bleed, episode of coffee-ground emesis at home this morning associated with presyncope

## 2021-07-11 NOTE — H&P (Addendum)
?Date: 07/11/2021     ?     ?     ?Patient Name:  Victoria Ball MRN: [id number]  ?DOB: 08-Jul-1925 Age / Sex: 88 y.o., female   ?PCP: Pcp, No    ?     ?     ?Medical Service: Internal Medicine Teaching Service    ?     ?     ?Attending Physician: Dr. Lottie Mussel, MD    ?First Contact: Nada Maclachlan, [place] 3 Pager: [phone]  ?Second Contact: Dr. Rosezetta Schlatter Pager: [phone]  ?Third Contact Dr. Virl Axe Pager: [phone]  ?     ?After Hours (After 5p/  First Contact Pager: [phone]  ?weekends / holidays): Second Contact Pager: [phone]  ? ?Chief Complaint: blood in emesis and stools, chronic pain ? ?History of Present Illness:  ?Victoria Ball is a 88 y.o. female with hx of prior subdural hematoma, osteoporosis, chronic pain 2/2 arthritis, and hypertension presenting today with hematemesis, melena, and chronic pain. ? ?Patient states that she began having bright red vomit since yesterday. Has had one episode yesterday and another episode today. She endorses associated nauseous since yesterday but is now resolved. She states she had a BM yesterday and it was black in color with bright red blood streaks. She typically has 1 BM every day which have previously been normal in color. Yesterday was the first time she noticed the black appearance. Denies any abdominal pain, diarrhea, or constipation today.  ? ?Patient also endorsing some stomach pain for the past month and states her stomach still does not feel good today. States pain does not change with eating. After asking her to point at the area on her abdomen, patient identified bilateral hips as area of pain and states it feels like it is arthritis. Patient states she has had this pain for a while and pain is currently at baseline. States she has chronic poor food intake but drinks enough, is not able to quantify how much fluids she takes in daily. She does endorse occassionally feeling like her heart is beating quickly. No dizziness with standing or lightheadedness.  Endorsing some generalized weakness today.  ? ?Patient was seen in the ED on 3/16 for chronic pain and was given 20 tablets of naproxen 375mg  in the ED, states she took her pills. States she only took five pills and does not remember when last dose was. No goody powder, BC, or ibuprofen use. ? ?No fevers, chills, chest pain, palpitations, SOB, no dysuria, urgency or changes to urine pattern or color.  ? ?ED course: Patient was transported to ED via EMS for suspected stroke. She was found slumped over with bloody emesis, hypotensive and with slight dysarthria per family. CT head with chronic changes, no acute infart or hemorrhage, cleared by Neuro. In ED black blood in emesis and melena in underwear was noted. She was mildly tachycardic and initially hypotensive, responded to fluids. Labs significant for Hgb 11.1, WBC 16.1, FOBT positive, CMP with creatinine 1.14, BUN 41, Glucose 126, GFR 45. IMTS called to admit. ? ?Collateral: ?Called daughter [name]. This morning, patient asked for help walking, sat down on the carpet, eyes rolled back, then noticed bloody emesis or nosebleed, unsure which. No emetic episodes yesterday. Has had complaints of nausea three days ago x 1, but none since. BM 1x/day, black stools, bright red blood on toilet paper first noticed three days ago. Has endorsed daily hip pain. Long hx of decreased appetite, states mother is anorexic. States she  has eaten well over the past couple of days but doe not drink. Drinking half a glass of water and 1 small can of Pepsi daily. ?Reviewed medications: Metoprolol and amlodipine only. Took half of an amitriptyline 3 days ago for difficulty sleeping. Ran out of medication at that time. Occasional Tylenol for pain. Daughter is not aware of Naproxen use. ?PCP: [facility] x 4 years, discharged 2 weeks ago, no pain meds since then. Discharged for suspicion of taking too many oxycodones (Rx 4x per day, running out ahead of time). ?Last oxy use: 1 month ago.   ? ?Meds:  ?Current Meds  ?Medication Sig  ? Acetaminophen (TYLENOL PO) Take 2 tablets by mouth daily as needed (pain).  ? amLODipine (NORVASC) 5 MG tablet TAKE 1 TABLET BY MOUTH EVERY DAY (Patient taking differently: Take 5 mg by mouth daily.)  ? ketoconazole (NIZORAL) 2 % shampoo Apply 1 application topically 2 (two) times a week. Shampoo scalp twice weekly and leave on several minutes before rinsing (Patient taking differently: Apply 1 application. topically 2 (two) times a week.)  ?Medications currently taking, confirmed by daughter [name]: ?Amlodipine 5mg  daily ?Metoprolol-XR 50mg  daily ?1/2 tablet amitriptyline 25mg  (three days ago) ? ?Previously taking: ?Oxycodone 10mg  q6h prn ?Oxycodone-acetaminophen 10-325mg  q6h prn ?Narcan ?Atorvastatin 40mg  ?Trazodone 50mg  qhs ?Ketoconazole shampoo ?Metoprolol-XR 50mg  daily ?Amlodipine 5mg  daily ?Telmisartan 40mg  daily ? ?Allergies: ?Allergies as of 07/11/2021 - Review Complete 07/11/2021  ?Allergen Reaction Noted  ? Hydrochlorothiazide  10/27/2016  ? ?Past Medical History:  ?Diagnosis Date  ? Arthritis   ? History of central retinal vein occlusion   ? HLD (hyperlipidemia)   ? Hypertension   ? OP (osteoporosis)   ? stopped Evista after 5 years  ? Subdural hematoma, acute 05/15/2015  ? left; "rolled off couch and hit head"  ? ? ?Family History:  ?Father passed away from MI ?Diabetes in brothers x2 ?Sister died from brain cancer, no other hx of cancer  ? ?Social History:  ?Patient has been living with daughter, [name], in [place]. Recently relocated, two weeks ago to live full time with daughter, [name]. Patient states she relies on [name] for help with showering, cooking, and most ADL and iADLs. [name] manages patients medications now and is full time caregiver. At baseline, patient is able to ambulate with walker or with support for others. She does not drink alcohol and has never smoked or used chewing tobacco. No recreational drug use. Daughter of patient states she was recently  discharged from PCP recently for opioid prescription abuse, no longer some medications due to not being able to refill.  ? ?Review of Systems: ?A complete ROS was negative except as per HPI.  ? ?Physical Exam: ?Blood pressure (!) 147/87, pulse (!) 103, temperature 99 ?F (37.2 ?C), temperature source Oral, resp. rate 16, height 5\' 4"  (1.626 m), weight 42.2 kg, last menstrual period 04/14/1977, SpO2 100 %. ? ?Constitutional: Elderly thin female laying comfortably in bed, NAD ?HEENT: Normocephalic, atraumatic, conjunctiva non-erythematous, no pallor, non icteric, moist mucus membranes, dry blood on mouth corners, moderate decreased skin turgor to upper chest   ?Cardio: normal rate and rhythm, no murmurs ?Pulm: CTAB, no increased work of breathing on room air, no wheezing  ?Abd: soft, non-tender, non-distended, + normal bowel sounds ?Extremities: moving all extremities spontaneously, no edema to bilateral LE, pallor to nail beds of all extremities, moderate decreased skin turgor to bilateral LE ?MSK: decreased bulk and tone, tender to palpation of bilateral hips ?Neuro: Alert and oriented  x3, answering questions appropriately ?Psych: normal affect ? ?EKG: personally reviewed my interpretation is normal sinus rhythm. ? ?Assessment & Plan by Problem: ?Principal Problem: ?  GI bleed ?Active Problems: ?  Vitamin D deficiency ?  HYPERCHOLESTEROLEMIA ?  HIP PAIN, RIGHT, CHRONIC ?  Osteoarthritis of both hips ?  History of subdural hematoma ?  CKD (chronic kidney disease) stage 3, GFR 30-59 ml/min (HCC) ? ?Victoria Ball is a 88 y.o. female with hx of prior subdural hematoma, osteoporosis, chronic pain 2/2 arthritis, and hypertension presenting today with hematemesis, melena, and chronic pain likely 2/2 upper GI bleed, source currently undetermined. ? ?Upper GI bleed ?Normocytic anemia ?Patient with 1x episode of coffee-ground emesis and dark tarry stools. On her differential is PUD, Mallory Weiss tear, and variceal bleed. She  was seen in the ED 06/27/21 for chronic pain and was prescribed 20 tablets of naproxen. It is unclear from family and patient how many tablets were actually taken. Given hx, presentation, and positive fecal

## 2021-07-11 NOTE — ED Triage Notes (Addendum)
Pt BIB [facility] from home as Code stroke with hematemesis. Pt was seen by family (LKN [phone]) to sit on the ground & then slump forward, bloody emesis was seen & they reported she had slurred speech. EMS arrived & endorses she was hypotensive, dysarthric, and LVO score 1 on there scale. Upon arrival to ED pt A/Ox4, Melena present as well, 20g PIV in Lt FA.  ?

## 2021-07-11 NOTE — Consult Note (Signed)
Neurology Attending Attestation ?  ?I examined the patient and discussed plan with resident Dr. Corky Sox. Above note has been edited by me to reflect my findings and recommendations. I was present throughout the stroke code and made all significant decisions and personally reviewed CNS imaging. Stroke code was called 2/2 dysarthria in the setting of severe hypotension. After 500cc bolus dysarthria resolved and NIHSS = 0. CT head personally reviewed NAICP. Patient had coffee ground emesis around her mouth and extensive melena when linens were changed. No TNK 2/2 no concern for stroke and active GI bleed. Stroke code canceled. No further neurologic workup indicated. ?  ?Su Monks, MD ?[facility] ?[phone] ?  ?If 7pm- 7am, please page neurology on call as listed in [place]. ? ? ?Neurology Consultation ? ?Reason for Consult: code stroke ?Referring Physician: Dr. Campbell Stall ? ?CC: code stroke ? ?History is obtained from: family, EMS, chart review ? ?HPI: Victoria Ball is a 88 y.o. female with a PMHx of HTN and osteoarthritis (on opioids) who was brought in as a code stroke for slurred speech and weakness. On assessment the patient is noted to have dried blood on the edge of her mouth. She is in obvious discomfort and is unable to provide an accurate history. Family was contacted (daughter, Ms. Pasty Arch at [phone]). She reports that the patient at baseline is normally conversant and capable of taking care of her ADLs. She reports that the patient was in her usual state of health until approximately 9 AM, when she went into the bathroom, complained of hip pain, then sat down against the wall and proceeded to vomit a large quantity of dark fluid on the floor. When EMS arrived they were concerned for slurred speech. Ms Pasty Arch reports she did not notice anything unusual about her mother's speech.  ? ? ?LKW: 8:30 AM ?TNK given?: no, too minimal to treat, suspect GI bleed ?IR Thrombectomy?  No, no evidence of LVO ?Modified Rankin Scale: 0-Completely asymptomatic and back to baseline post- stroke ? ? ?Past Medical History:  ?Diagnosis Date  ? Arthritis   ? History of central retinal vein occlusion   ? HLD (hyperlipidemia)   ? Hypertension   ? OP (osteoporosis)   ? stopped Evista after 5 years  ? Subdural hematoma, acute ([place]) 05/15/2015  ? left; "rolled off couch and hit head"  ? ? ? ?Family History  ?Problem Relation Age of Onset  ? Coronary artery disease Father   ? Diabetes Father   ?     ?   ? Heart attack Sister 74  ? Coronary artery disease Brother   ?     CABG  ? Coronary artery disease Brother   ?     CABG  ? Coronary artery disease Brother   ?     CABG  ? Angelman syndrome Paternal Grandfather   ? ? ? ?Social History:  ? reports that she is a non-smoker but has been exposed to tobacco smoke. She has never used smokeless tobacco. She reports that she does not drink alcohol and does not use drugs. ? ?Medications ? ?Current Facility-Administered Medications:  ?  pantoprazole (PROTONIX) 80 mg /NS 100 mL IVPB, 80 mg, Intravenous, Once, Campbell Stall P, DO ?  [START ON 07/14/2021] pantoprazole (PROTONIX) injection 40 mg, 40 mg, Intravenous, [id number], Campbell Stall P, DO ?  pantoprozole (PROTONIX) 80 mg /NS 100 mL infusion, 8 mg/hr, Intravenous, Continuous, Campbell Stall P, DO ? ?Current Outpatient Medications:  ?  amitriptyline (  ELAVIL) 25 MG tablet, TAKE 1 TABLET BY MOUTH EVERYDAY AT BEDTIME, Disp: 30 tablet, Rfl: 0 ?  amLODipine (NORVASC) 5 MG tablet, TAKE 1 TABLET BY MOUTH EVERY DAY, Disp: 90 tablet, Rfl: 0 ?  cholecalciferol (VITAMIN D3) 25 MCG (1000 UT) tablet, Take 4,000 Units by mouth daily., Disp: , Rfl:  ?  ketoconazole (NIZORAL) 2 % shampoo, Apply 1 application topically 2 (two) times a week. Shampoo scalp twice weekly and leave on several minutes before rinsing, Disp: 120 mL, Rfl: 3 ?  lisinopril (ZESTRIL) 10 MG tablet, Take 1 tablet (10 mg total) by mouth daily., Disp: 90 tablet, Rfl: 3 ?   oxyCODONE-acetaminophen (PERCOCET) 10-325 MG tablet, Take 1 tablet by mouth every 6 (six) hours as needed for pain., Disp: , Rfl:  ? ? ?Exam: ?Current vital signs: ?Ht 5\' 4"  (1.626 m)   Wt 42.2 kg   LMP 04/14/1977   BMI 15.97 kg/m?  ?Vital signs in last 24 hours: ?Weight:  [42.2 kg] 42.2 kg (03/30 0900) ? ?GENERAL: Awake, alert, in obvious discomfort ?Head: Normocephalic and atraumatic, without obvious abnormality ?EENT: Normal conjunctivae, dry mucous membranes, no OP obstruction ?LUNGS: Normal respiratory effort. Non-labored breathing on room air ?CV: Regular rate and rhythm on telemetry ?ABDOMEN: Soft, non-tender, non-distended ?Extremities: warm, well perfused, without obvious deformity ? ?NEURO:  ? ?NIHSS: ?1a Level of Conscious.: 0 ?1b LOC Questions: 0 ?1c LOC Commands: 0 ?2 Best Gaze: 0 ?3 Visual: 0 ?4 Facial Palsy: 0  ?5a Motor Arm - left: 0 ?5b Motor Arm - Right: 0  ?6a Motor Leg - Left: 0 ?6b Motor Leg - Right: 0 ?7 Limb Ataxia: 0 ?8 Sensory: 0 ?9 Best Language: 0 ?10 Dysarthria: 0 ?11 Extinct. and Inatten.: 0 ?TOTAL: 0 ? ? ?Labs ?I have reviewed labs in epic and the results pertinent to this consultation are: ?Glc: 122, Cr 1.1 ? ?CBC ?   ?Component Value Date/Time  ? WBC 8.6 02/22/2019 1623  ? RBC 4.27 02/22/2019 1623  ? HGB 11.2 (L) 07/11/2021 0943  ? HCT 33.0 (L) 07/11/2021 0943  ? PLT 349.0 02/22/2019 1623  ? MCV 94.9 02/22/2019 1623  ? MCH 30.2 10/23/2016 1755  ? MCHC 33.4 02/22/2019 1623  ? RDW 13.3 02/22/2019 1623  ? LYMPHSABS 2.5 02/22/2019 1623  ? MONOABS 0.6 02/22/2019 1623  ? EOSABS 0.1 02/22/2019 1623  ? BASOSABS 0.1 02/22/2019 1623  ? ? ?CMP  ?   ?Component Value Date/Time  ? NA 139 07/11/2021 0943  ? K 4.4 07/11/2021 0943  ? CL 109 07/11/2021 0943  ? CO2 28 02/22/2019 1623  ? GLUCOSE 122 (H) 07/11/2021 0943  ? BUN 40 (H) 07/11/2021 0943  ? CREATININE 1.10 (H) 07/11/2021 0943  ? CALCIUM 8.8 02/22/2019 1623  ? PROT 6.3 02/22/2019 1623  ? ALBUMIN 3.8 02/22/2019 1623  ? AST 12 02/22/2019 1623   ? ALT 8 02/22/2019 1623  ? ALKPHOS 56 02/22/2019 1623  ? BILITOT 0.4 02/22/2019 1623  ? GFRNONAA 31 (L) 10/23/2016 1755  ? GFRAA 36 (L) 10/23/2016 1755  ? ? ?Lipid Panel  ?   ?Component Value Date/Time  ? CHOL 229 (H) 02/22/2019 1623  ? TRIG 78.0 02/22/2019 1623  ? HDL 58.90 02/22/2019 1623  ? CHOLHDL 4 02/22/2019 1623  ? VLDL 15.6 02/22/2019 1623  ? LDLCALC 154 (H) 02/22/2019 1623  ? LDLDIRECT 118.8 12/14/2012 1213  ? ? ? ?Imaging ?CTH:  ?IMPRESSION: ?1. No acute cortically based infarct or acute intracranial ?hemorrhage identified. ASPECTS 10. ?2.  Chronic small vessel disease with progression since 2017. ? ?Assessment:  ?TAMELIA SILVERIO is a 88 y.o. female with a PMHx of HTN and osteoarthritis (on opioids) who was brought in as a code stroke for slurred speech and weakness.  ? ?Negative CTH and NIHSS of 0. Patient later found to have melanotic stools, in addition to the previous report of hematemesis. Strong suspicion for GI bleed at this point.  ? ?Impression: no acute stroke ? ?Recommendations: ?-No need for follow up imaging or work up at this time ? ? ?Corky Sox, MD ?PGY-1 ? ? ? ?

## 2021-07-11 NOTE — Consult Note (Signed)
? ? ?Consultation ? ?Referring Provider: Medicine service/Machen MD ?Primary Gastroenterologist:  Dr. Lesia Sago ? ?Reason for Consultation: GI bleed, coffee-ground emesis ? ?HPI: Victoria Ball is a 88 y.o. female, with history of prior subdural hematoma, osteoporosis, hypertension and arthritis, who was brought to the emergency room this morning via EMS after she was found on the floor in the bathroom by her daughter a bit confused, with slurred speech and black material on her face felt to be consistent with coffee-ground emesis. ?On further questioning patient said that she had been nauseated a few days ago, had not vomited.  She has no ongoing complaints of abdominal pain, does remember having bright red blood and dark blood with her stool yesterday x2, has not had a bowel movement today No complaints of heartburn or indigestion, no chest pain no shortness of breath.  No dysphagia.  Appetite never good but no recent changes ?Has been taking an over-the-counter arthritis medication, not clear whether this is Tylenol or an NSAID usually 2/day ? ?Prior history of GI bleeding.  No prior EGD. ?No blood thinners ?Note colonoscopy 2004 for screening was negative ? ?She has been hemodynamically stable in the emergency room ?Initial hemoglobin 11.1/hematocrit 34.2 WBC 16.1, platelets 455 ?BUN 41/creatinine 1.14 ?LFTs within normal limits ?INR 1.1, stool documented heme positive ? ? ? ? ? ? ?Past Medical History:  ?Diagnosis Date  ? Arthritis   ? History of central retinal vein occlusion   ? HLD (hyperlipidemia)   ? Hypertension   ? OP (osteoporosis)   ? stopped Evista after 5 years  ? Subdural hematoma, acute 05/15/2015  ? left; "rolled off couch and hit head"  ? ? ?Past Surgical History:  ?Procedure Laterality Date  ? ABDOMINAL HYSTERECTOMY  1994  ? fibroid tumor  ? CATARACT EXTRACTION W/ INTRAOCULAR LENS  IMPLANT, BILATERAL Bilateral   ? ? ?Prior to Admission medications   ?Medication Sig Start Date End Date Taking?  Authorizing Provider  ?Acetaminophen (TYLENOL PO) Take 2 tablets by mouth daily as needed (pain).   Yes [provider]  ?amLODipine (NORVASC) 5 MG tablet TAKE 1 TABLET BY MOUTH EVERY DAY ?Patient taking differently: Take 5 mg by mouth daily. 02/22/20  Yes Tower, Audrie Gallus, MD  ?ketoconazole (NIZORAL) 2 % shampoo Apply 1 application topically 2 (two) times a week. Shampoo scalp twice weekly and leave on several minutes before rinsing ?Patient taking differently: Apply 1 application. topically 2 (two) times a week. 02/24/19  Yes Tower, Audrie Gallus, MD  ?amitriptyline (ELAVIL) 25 MG tablet TAKE 1 TABLET BY MOUTH EVERYDAY AT BEDTIME ?Patient taking differently: Take 25 mg by mouth at bedtime. 02/08/20   Tower, Audrie Gallus, MD  ?lisinopril (ZESTRIL) 10 MG tablet Take 1 tablet (10 mg total) by mouth daily. ?Patient not taking: Reported on 07/11/2021 02/22/19   Tower, Audrie Gallus, MD  ?metoprolol succinate (TOPROL-XL) 50 MG 24 hr tablet Take 50 mg by mouth daily. ?Patient not taking: Reported on 07/11/2021 06/30/21   [provider]  ? ? ?Current Facility-Administered Medications  ?Medication Dose Route Frequency Provider Last Rate Last Admin  ? acetaminophen (TYLENOL) tablet 650 mg  650 mg Oral Q6H PRN Merrilyn Puma, MD      ? Or  ? acetaminophen (TYLENOL) suppository 650 mg  650 mg Rectal Q6H PRN Merrilyn Puma, MD      ? Melene Muller ON 07/14/2021] pantoprazole (PROTONIX) injection 40 mg  40 mg Intravenous Q12H Edwin Dada P, DO      ?  pantoprozole (PROTONIX) 80 mg /NS 100 mL infusion  8 mg/hr Intravenous Continuous Edwin DadaGray, Alicia P, DO 10 mL/hr at 07/11/21 1017 8 mg/hr at 07/11/21 1017  ? ?Current Outpatient Medications  ?Medication Sig Dispense Refill  ? Acetaminophen (TYLENOL PO) Take 2 tablets by mouth daily as needed (pain).    ? amLODipine (NORVASC) 5 MG tablet TAKE 1 TABLET BY MOUTH EVERY DAY (Patient taking differently: Take 5 mg by mouth daily.) 90 tablet 0  ? ketoconazole (NIZORAL) 2 % shampoo Apply 1 application  topically 2 (two) times a week. Shampoo scalp twice weekly and leave on several minutes before rinsing (Patient taking differently: Apply 1 application. topically 2 (two) times a week.) 120 mL 3  ? amitriptyline (ELAVIL) 25 MG tablet TAKE 1 TABLET BY MOUTH EVERYDAY AT BEDTIME (Patient taking differently: Take 25 mg by mouth at bedtime.) 30 tablet 0  ? lisinopril (ZESTRIL) 10 MG tablet Take 1 tablet (10 mg total) by mouth daily. (Patient not taking: Reported on 07/11/2021) 90 tablet 3  ? metoprolol succinate (TOPROL-XL) 50 MG 24 hr tablet Take 50 mg by mouth daily. (Patient not taking: Reported on 07/11/2021)    ? ? ?Allergies as of 07/11/2021 - Review Complete 07/11/2021  ?Allergen Reaction Noted  ? Hydrochlorothiazide  10/27/2016  ? ? ?Family History  ?Problem Relation Age of Onset  ? Coronary artery disease Father   ? Diabetes Father   ?     ?   ? Heart attack Sister [id number]  ? Coronary artery disease Brother   ?     CABG  ? Coronary artery disease Brother   ?     CABG  ? Coronary artery disease Brother   ?     CABG  ? Angelman syndrome Paternal Grandfather   ? ? ?Social History  ? ?Socioeconomic History  ? Marital status: Widowed  ?  Spouse name: Not on file  ? Number of children: Not on file  ? Years of education: Not on file  ? Highest education level: Not on file  ?Occupational History  ? Occupation: Retired  ?Tobacco Use  ? Smoking status: Passive Smoke Exposure - Never Smoker  ? Smokeless tobacco: Never  ?Substance and Sexual Activity  ? Alcohol use: No  ?  Alcohol/week: 0.0 standard drinks  ? Drug use: No  ? Sexual activity: Not Currently  ?Other Topics Concern  ? Not on file  ?Social History Narrative  ? Not on file  ? ?Social Determinants of Health  ? ?Financial Resource Strain: Not on file  ?Food Insecurity: Not on file  ?Transportation Needs: Not on file  ?Physical Activity: Not on file  ?Stress: Not on file  ?Social Connections: Not on file  ?Intimate Partner Violence: Not on file  ? ? ?Review of  Systems: ?Pertinent positive and negative review of systems were noted in the above HPI section.  All other review of systems was otherwise negative.  ? ?Physical Exam: ?Vital signs in last 24 hours: ?Pulse Rate:  [30-116] 116 (03/30 1400) ?Resp:  [14-35] 17 (03/30 1400) ?BP: (103-161)/(59-95) 144/95 (03/30 1400) ?SpO2:  [93 %-100 %] 98 % (03/30 1400) ?Weight:  [42.2 kg] 42.2 kg (03/30 0900) ?  ?General:   Alert,  Well-developed, frail-appearing elderly white female ?Head:  Normocephalic and atraumatic. ?Eyes:  Sclera clear, no icterus.   Conjunctiva pink. ?Ears:  Normal auditory acuity. ?Nose:  No deformity, discharge,  or lesions. ?Mouth:  No deformity or lesions.   ?Neck:  Supple; no masses  or thyromegaly. ?Lungs:  Clear throughout to auscultation.   No wheezes, crackles, or rhonchi. ? Heart:  Regular rate and rhythm; no murmurs, clicks, rubs,  or gallops. ?Abdomen:  Soft,nontender, BS active,nonpalp mass or hsm.  ?Rectal: Not done, documented heme positive ?Msk:  Symmetrical without gross deformities. Marland Kitchen ?Pulses:  Normal pulses noted. ?Extremities:  Without clubbing or edema. ?Neurologic:  Alert and  oriented x4;  grossly normal neurologically. ?Skin:  Intact without significant lesions or rashes.Marland Kitchen ?Psych:  Alert and cooperative. Normal mood and affect. ? ?Intake/Output from previous day: ?No intake/output data recorded. ?Intake/Output this shift: ?No intake/output data recorded. ? ?Lab Results: ?Recent Labs  ?  07/11/21 ?5784 07/11/21 ?6962  ?WBC 16.1*  --   ?HGB 11.1* 11.2*  ?HCT 34.2* 33.0*  ?PLT 455*  --   ? ?BMET ?Recent Labs  ?  07/11/21 ?9528 07/11/21 ?4132  ?NA 139 139  ?K 4.4 4.4  ?CL 111 109  ?CO2 19*  --   ?GLUCOSE 126* 122*  ?BUN 41* 40*  ?CREATININE 1.14* 1.10*  ?CALCIUM 7.7*  --   ? ?LFT ?Recent Labs  ?  07/11/21 ?4401  ?PROT 5.0*  ?ALBUMIN 2.4*  ?AST 13*  ?ALT 10  ?ALKPHOS 76  ?BILITOT 0.5  ? ?PT/INR ?Recent Labs  ?  07/11/21 ?1006  ?LABPROT 14.4  ?INR 1.1  ? ?Hepatitis Panel ?No results for  input(s): HEPBSAG, HCVAB, HEPAIGM, HEPBIGM in the last 72 hours. ? ? ? ?IMPRESSION:  ?#5 88 year old white female with acute upper GI bleed, episode of coffee-ground emesis at home this morning associated with presyncope

## 2021-07-11 NOTE — Code Documentation (Signed)
Stroke Response Nurse Documentation ?Code Documentation ? ?Victoria Ball is a 88 y.o. female arriving to [facility]  via [facility] on 07/11/21 with past medical hx of HTN, Arthritis. On No antithrombotic. Code stroke was activated by EMS.  ? ?Patient from home where she was LKW at 0845 and now complaining of slurred speech. She was complaining of hip pain and daughter found her sitting down on the floor with coffee ground emesis on her face and on the floor. Reports she did not fall or hit her head when sitting down. ? ?Stroke team at the bedside on patient arrival. Labs drawn and patient cleared for CT by EDP. Patient to CT with team. NIHSS 0, see documentation for details and code stroke times. The following imaging was completed:  CT Head. Patient is not a candidate for IV Thrombolytic due to NIHSS 0. Patient is not not a candidate for IR due to no suspected LVO.  ? ?Care Plan: Code stroke cancelled. Symptoms thought to be related to BI bleed. ? ?Bedside handoff with ED RN. ? ?Toniann Fail  ?Stroke Response RN ? ? ?

## 2021-07-12 ENCOUNTER — Observation Stay (HOSPITAL_COMMUNITY): Payer: Medicare PPO | Admitting: Anesthesiology

## 2021-07-12 ENCOUNTER — Encounter (HOSPITAL_COMMUNITY)
Admission: EM | Disposition: A | Discharge status: Home / Self Care | Payer: Self-pay | Source: Home / Self Care | Attending: Internal Medicine

## 2021-07-12 ENCOUNTER — Encounter (HOSPITAL_COMMUNITY): Payer: Self-pay | Admitting: Internal Medicine

## 2021-07-12 DIAGNOSIS — E46 Unspecified protein-calorie malnutrition: Secondary | ICD-10-CM

## 2021-07-12 DIAGNOSIS — L89322 Pressure ulcer of left buttock, stage 2: Secondary | ICD-10-CM | POA: Diagnosis present

## 2021-07-12 DIAGNOSIS — E43 Unspecified severe protein-calorie malnutrition: Secondary | ICD-10-CM | POA: Insufficient documentation

## 2021-07-12 DIAGNOSIS — K2961 Other gastritis with bleeding: Secondary | ICD-10-CM | POA: Diagnosis present

## 2021-07-12 DIAGNOSIS — M810 Age-related osteoporosis without current pathological fracture: Secondary | ICD-10-CM | POA: Diagnosis present

## 2021-07-12 DIAGNOSIS — K269 Duodenal ulcer, unspecified as acute or chronic, without hemorrhage or perforation: Secondary | ICD-10-CM

## 2021-07-12 DIAGNOSIS — K2289 Other specified disease of esophagus: Secondary | ICD-10-CM

## 2021-07-12 DIAGNOSIS — N1831 Chronic kidney disease, stage 3a: Secondary | ICD-10-CM | POA: Diagnosis not present

## 2021-07-12 DIAGNOSIS — K922 Gastrointestinal hemorrhage, unspecified: Secondary | ICD-10-CM | POA: Diagnosis not present

## 2021-07-12 DIAGNOSIS — I129 Hypertensive chronic kidney disease with stage 1 through stage 4 chronic kidney disease, or unspecified chronic kidney disease: Secondary | ICD-10-CM

## 2021-07-12 DIAGNOSIS — N1832 Chronic kidney disease, stage 3b: Secondary | ICD-10-CM | POA: Diagnosis present

## 2021-07-12 DIAGNOSIS — K264 Chronic or unspecified duodenal ulcer with hemorrhage: Secondary | ICD-10-CM

## 2021-07-12 DIAGNOSIS — L89329 Pressure ulcer of left buttock, unspecified stage: Secondary | ICD-10-CM

## 2021-07-12 DIAGNOSIS — D62 Acute posthemorrhagic anemia: Secondary | ICD-10-CM | POA: Diagnosis present

## 2021-07-12 DIAGNOSIS — D72828 Other elevated white blood cell count: Secondary | ICD-10-CM | POA: Diagnosis present

## 2021-07-12 DIAGNOSIS — Z90710 Acquired absence of both cervix and uterus: Secondary | ICD-10-CM | POA: Diagnosis not present

## 2021-07-12 DIAGNOSIS — M1612 Unilateral primary osteoarthritis, left hip: Secondary | ICD-10-CM

## 2021-07-12 DIAGNOSIS — Z20822 Contact with and (suspected) exposure to covid-19: Secondary | ICD-10-CM | POA: Diagnosis present

## 2021-07-12 DIAGNOSIS — G8929 Other chronic pain: Secondary | ICD-10-CM | POA: Diagnosis present

## 2021-07-12 DIAGNOSIS — K254 Chronic or unspecified gastric ulcer with hemorrhage: Secondary | ICD-10-CM | POA: Diagnosis present

## 2021-07-12 DIAGNOSIS — Z8249 Family history of ischemic heart disease and other diseases of the circulatory system: Secondary | ICD-10-CM | POA: Diagnosis not present

## 2021-07-12 DIAGNOSIS — N1330 Unspecified hydronephrosis: Secondary | ICD-10-CM | POA: Diagnosis present

## 2021-07-12 DIAGNOSIS — M160 Bilateral primary osteoarthritis of hip: Secondary | ICD-10-CM | POA: Diagnosis present

## 2021-07-12 DIAGNOSIS — K2970 Gastritis, unspecified, without bleeding: Secondary | ICD-10-CM | POA: Diagnosis not present

## 2021-07-12 DIAGNOSIS — Z833 Family history of diabetes mellitus: Secondary | ICD-10-CM | POA: Diagnosis not present

## 2021-07-12 DIAGNOSIS — Z681 Body mass index (BMI) 19 or less, adult: Secondary | ICD-10-CM | POA: Diagnosis not present

## 2021-07-12 DIAGNOSIS — E559 Vitamin D deficiency, unspecified: Secondary | ICD-10-CM | POA: Diagnosis present

## 2021-07-12 DIAGNOSIS — K2091 Esophagitis, unspecified with bleeding: Secondary | ICD-10-CM | POA: Diagnosis not present

## 2021-07-12 DIAGNOSIS — E7800 Pure hypercholesterolemia, unspecified: Secondary | ICD-10-CM | POA: Diagnosis present

## 2021-07-12 DIAGNOSIS — M6281 Muscle weakness (generalized): Secondary | ICD-10-CM

## 2021-07-12 DIAGNOSIS — K259 Gastric ulcer, unspecified as acute or chronic, without hemorrhage or perforation: Secondary | ICD-10-CM | POA: Diagnosis not present

## 2021-07-12 DIAGNOSIS — Z808 Family history of malignant neoplasm of other organs or systems: Secondary | ICD-10-CM | POA: Diagnosis not present

## 2021-07-12 DIAGNOSIS — K449 Diaphragmatic hernia without obstruction or gangrene: Secondary | ICD-10-CM | POA: Diagnosis present

## 2021-07-12 DIAGNOSIS — K921 Melena: Secondary | ICD-10-CM | POA: Diagnosis present

## 2021-07-12 DIAGNOSIS — M1990 Unspecified osteoarthritis, unspecified site: Secondary | ICD-10-CM | POA: Diagnosis not present

## 2021-07-12 DIAGNOSIS — E785 Hyperlipidemia, unspecified: Secondary | ICD-10-CM

## 2021-07-12 DIAGNOSIS — K3189 Other diseases of stomach and duodenum: Secondary | ICD-10-CM | POA: Diagnosis present

## 2021-07-12 HISTORY — PX: ESOPHAGOGASTRODUODENOSCOPY (EGD) WITH PROPOFOL: SHX5813

## 2021-07-12 HISTORY — PX: BIOPSY: SHX5522

## 2021-07-12 LAB — COMPREHENSIVE METABOLIC PANEL
ALT: 9 U/L (ref 0–44)
AST: 14 U/L — ABNORMAL LOW (ref 15–41)
Albumin: 2.5 g/dL — ABNORMAL LOW (ref 3.5–5.0)
Alkaline Phosphatase: 64 U/L (ref 38–126)
Anion gap: 6 (ref 5–15)
BUN: 42 mg/dL — ABNORMAL HIGH (ref 8–23)
CO2: 20 mmol/L — ABNORMAL LOW (ref 22–32)
Calcium: 7.7 mg/dL — ABNORMAL LOW (ref 8.9–10.3)
Chloride: 110 mmol/L (ref 98–111)
Creatinine, Ser: 1.04 mg/dL — ABNORMAL HIGH (ref 0.44–1.00)
GFR, Estimated: 50 mL/min — ABNORMAL LOW (ref >60)
Glucose, Bld: 77 mg/dL (ref 70–99)
Potassium: 3.8 mmol/L (ref 3.5–5.1)
Sodium: 136 mmol/L (ref 135–145)
Total Bilirubin: 0.5 mg/dL (ref 0.3–1.2)
Total Protein: 4.8 g/dL — ABNORMAL LOW (ref 6.5–8.1)

## 2021-07-12 LAB — CBC WITH DIFFERENTIAL/PLATELET
Abs Immature Granulocytes: 0.15 10*3/uL — ABNORMAL HIGH (ref 0.00–0.07)
Basophils Absolute: 0.1 10*3/uL (ref 0.0–0.1)
Basophils Relative: 0 %
Eosinophils Absolute: 0.2 10*3/uL (ref 0.0–0.5)
Eosinophils Relative: 2 %
HCT: 28.4 % — ABNORMAL LOW (ref 36.0–46.0)
Hemoglobin: 9.2 g/dL — ABNORMAL LOW (ref 12.0–15.0)
Immature Granulocytes: 1 %
Lymphocytes Relative: 24 %
Lymphs Abs: 3.4 10*3/uL (ref 0.7–4.0)
MCH: 29.9 pg (ref 26.0–34.0)
MCHC: 32.4 g/dL (ref 30.0–36.0)
MCV: 92.2 fL (ref 80.0–100.0)
Monocytes Absolute: 0.7 10*3/uL (ref 0.1–1.0)
Monocytes Relative: 5 %
Neutro Abs: 9.4 10*3/uL — ABNORMAL HIGH (ref 1.7–7.7)
Neutrophils Relative %: 68 %
Platelets: 384 10*3/uL (ref 150–400)
RBC: 3.08 MIL/uL — ABNORMAL LOW (ref 3.87–5.11)
RDW: 14.3 % (ref 11.5–15.5)
WBC: 13.8 10*3/uL — ABNORMAL HIGH (ref 4.0–10.5)
nRBC: 0 % (ref 0.0–0.2)

## 2021-07-12 LAB — HEMOGLOBIN AND HEMATOCRIT, BLOOD
HCT: 26.5 % — ABNORMAL LOW (ref 36.0–46.0)
HCT: 25.3 % — ABNORMAL LOW (ref 36.0–46.0)
Hemoglobin: 8.7 g/dL — ABNORMAL LOW (ref 12.0–15.0)
Hemoglobin: 8 g/dL — ABNORMAL LOW (ref 12.0–15.0)

## 2021-07-12 LAB — ABO/RH: ABO/RH(D): O NEG

## 2021-07-12 SURGERY — ESOPHAGOGASTRODUODENOSCOPY (EGD) WITH PROPOFOL
Anesthesia: Monitor Anesthesia Care

## 2021-07-12 MED ORDER — SODIUM CHLORIDE 0.9 % IV SOLN
INTRAVENOUS | Status: DC
Start: 2021-07-12 — End: 2021-07-12

## 2021-07-12 MED ORDER — LIDOCAINE 2% (20 MG/ML) 5 ML SYRINGE
INTRAMUSCULAR | Status: DC | PRN
  Administered 2021-07-12: 40 mg via INTRAVENOUS

## 2021-07-12 MED ORDER — PROPOFOL 10 MG/ML IV BOLUS
INTRAVENOUS | Status: DC | PRN
Start: 2021-07-12 — End: 2021-07-12
  Administered 2021-07-12: 40 mg via INTRAVENOUS
  Administered 2021-07-12: 20 mg via INTRAVENOUS

## 2021-07-12 MED ORDER — ACETAMINOPHEN 650 MG RE SUPP: 650.0000 mg | Freq: Three times a day (TID) | RECTAL | Status: DC | PRN

## 2021-07-12 MED ORDER — ACETAMINOPHEN 325 MG PO TABS
650.0000 mg | ORAL_TABLET | Freq: Once | ORAL | Status: DC | PRN
Start: 2021-07-12 — End: 2021-07-14

## 2021-07-12 MED ORDER — PROPOFOL 500 MG/50ML IV EMUL
INTRAVENOUS | Status: DC | PRN
  Administered 2021-07-12: 25 ug/kg/min via INTRAVENOUS

## 2021-07-12 MED ORDER — ACETAMINOPHEN 500 MG PO TABS: 1000.0000 mg | ORAL_TABLET | Freq: Three times a day (TID) | ORAL | Status: DC | PRN

## 2021-07-12 MED ORDER — ENSURE ENLIVE PO LIQD
237.0000 mL | Freq: Three times a day (TID) | ORAL | Status: DC
  Administered 2021-07-12 – 2021-07-14 (×7): 237 mL via ORAL

## 2021-07-12 MED ORDER — PHENYLEPHRINE 40 MCG/ML (10ML) SYRINGE FOR IV PUSH (FOR BLOOD PRESSURE SUPPORT)
PREFILLED_SYRINGE | INTRAVENOUS | Status: DC | PRN
  Administered 2021-07-12: 80 ug via INTRAVENOUS

## 2021-07-12 MED ORDER — SUCRALFATE 1 GM/10ML PO SUSP
1.0000 g | Freq: Three times a day (TID) | ORAL | Status: DC
  Administered 2021-07-12 – 2021-07-14 (×9): 1 g via ORAL
  Filled 2021-07-12 (×8): qty 10

## 2021-07-12 SURGICAL SUPPLY — 15 items

## 2021-07-12 NOTE — Progress Notes (Signed)
PT Cancellation Note ? ?Patient Details ?Name: Victoria Ball ?MRN: [id number] ?DOB: [birth year] ? ? ?Cancelled Treatment:    Reason Eval/Treat Not Completed: Fatigue limiting ability to participate. Pt refusing stating she is too tired. Will continue attempts. ? ? ?[facility] ?07/12/2021, 3:29 PM ?Skip Mayer PT ?Acute Rehabilitation Services ?Pager [phone] ?Office [phone] ? ?

## 2021-07-12 NOTE — Anesthesia Preprocedure Evaluation (Signed)
Anesthesia Evaluation  ?Patient identified by MRN, date of birth, ID band ?Patient awake ? ? ? ?Reviewed: ?Allergy & Precautions, H&P , NPO status , Patient's Chart, lab work & pertinent test results, reviewed documented beta blocker date and time  ? ?Airway ?Mallampati: III ? ?TM Distance: >3 FB ?Neck ROM: full ? ? ? Dental ?no notable dental hx. ?(+) Edentulous Upper, Edentulous Lower, Upper Dentures ?  ?Pulmonary ?neg pulmonary ROS,  ?  ?Pulmonary exam normal ?breath sounds clear to auscultation ? ? ? ? ? ? Cardiovascular ?Exercise Tolerance: Good ?hypertension, Pt. on medications ?negative cardio ROS ? ? ?Rhythm:regular Rate:Normal ? ? ?  ?Neuro/Psych ?negative neurological ROS ? negative psych ROS  ? GI/Hepatic ?negative GI ROS, Neg liver ROS,   ?Endo/Other  ?negative endocrine ROS ? Renal/GU ?negative Renal ROS  ?negative genitourinary ?  ?Musculoskeletal ? ?(+) Arthritis , Osteoarthritis,   ? Abdominal ?  ?Peds ? Hematology ?negative hematology ROS ?(+)   ?Anesthesia Other Findings ? ? Reproductive/Obstetrics ?negative OB ROS ? ?  ? ? ? ? ? ? ? ? ? ? ? ? ? ?  ?  ? ? ? ? ? ? ? ? ?Anesthesia Physical ?Anesthesia Plan ? ?ASA: 4 ? ?Anesthesia Plan: MAC  ? ?Post-op Pain Management: Minimal or no pain anticipated  ? ?Induction:  ? ?PONV Risk Score and Plan: 2 and Propofol infusion ? ?Airway Management Planned: Natural Airway, Nasal Cannula and Simple Face Mask ? ?Additional Equipment: None ? ?Intra-op Plan:  ? ?Post-operative Plan:  ? ?Informed Consent: I have reviewed the patients History and Physical, chart, labs and discussed the procedure including the risks, benefits and alternatives for the proposed anesthesia with the patient or authorized representative who has indicated his/her understanding and acceptance.  ? ? ? ?Dental Advisory Given ? ?Plan Discussed with: CRNA and Anesthesiologist ? ?Anesthesia Plan Comments:   ? ? ? ? ? ? ?Anesthesia Quick Evaluation ? ?

## 2021-07-12 NOTE — Anesthesia Procedure Notes (Signed)
Procedure Name: [place] ?Date/Time: 07/12/2021 9:55 AM ?Performed by: Lorie Phenix, CRNA ?Pre-anesthesia Checklist: Patient identified, Emergency Drugs available, Suction available and Patient being monitored ?Patient Re-evaluated:Patient Re-evaluated prior to induction ?Oxygen Delivery Method: Nasal cannula ?Placement Confirmation: positive ETCO2 ? ? ? ? ?

## 2021-07-12 NOTE — Interval H&P Note (Signed)
History and Physical Interval Note: ? ?07/12/2021 ?9:44 AM ? ?Victoria Ball  has presented today for surgery, with the diagnosis of GI bleed.  The various methods of treatment have been discussed with the patient and family. After consideration of risks, benefits and other options for treatment, the patient has consented to  Procedure(s): ?ESOPHAGOGASTRODUODENOSCOPY (EGD) WITH PROPOFOL (N/A) as a surgical intervention.  The patient's history has been reviewed, patient examined, no change in status, stable for surgery.  I have reviewed the patient's chart and labs.  Questions were answered to the patient's satisfaction.   ? ? ?[name] ? ? ?

## 2021-07-12 NOTE — Transfer of Care (Signed)
Immediate Anesthesia Transfer of Care Note ? ?Patient: Victoria Ball ? ?Procedure(s) Performed: ESOPHAGOGASTRODUODENOSCOPY (EGD) WITH PROPOFOL ? ?Patient Location: PACU ? ?Anesthesia Type:MAC ? ?Level of Consciousness: awake and alert  ? ?Airway & Oxygen Therapy: Patient Spontanous Breathing ? ?Post-op Assessment: Report given to RN and Post -op Vital signs reviewed and stable ? ?Post vital signs: Reviewed and stable ? ?Last Vitals:  ?Vitals Value Taken Time  ?BP 122/66 07/12/21 1018  ?Temp    ?Pulse 88   ?Resp 12   ?SpO2 100   ?Vitals shown include unvalidated device data. ? ?Last Pain:  ?Vitals:  ? 07/12/21 0918  ?TempSrc: Temporal  ?PainSc: 0-No pain  ?   ? ?  ? ?Complications: No notable events documented. ?

## 2021-07-12 NOTE — Progress Notes (Addendum)
? ?Subjective: ? ?NAEO. Patient's two friends/neighbors at bedside, left room for privacy during eval. This morning after EGD, she feels about the same, has abdominal pain in the same place as before. Denies nausea, no further vomiting. Discussed presence of the GI ulcers causing anemia. Discussed starting liquid diet. Patient does endorse a bit of an appetite. Has not yet tried using the restroom since EGD this morning. Discussed patient may still see dark stools the next few days as the ulcers heal. ? ?Patient reports she cannot walk at home but then states she can ambulate with a walker. Denies pain at skin ulcer site on left buttock. She has been spending most of her time sedentary. Patient is not sure if she is getting enough care at home. Patient reports she cannot live with [name]. Also states she cannot go back with [name], states she is not sure why. States thinks [name] doesn't want her back to live with her. Answers "no" when asked if she feels safe at home. Discussed needing good plan for safe discharge with patient, eval with PT. ? ?Objective: ? ?Vital signs in last 24 hours: ?Vitals:  ? 07/12/21 0730 07/12/21 0918 07/12/21 1018 07/12/21 1033  ?BP: 136/68 (!) 130/56 122/66 (!) 143/63  ?Pulse: 84 89 82 78  ?Resp: (!) 24 (!) 22 12 (!) 22  ?Temp:  97.7 ?F (36.5 ?C) 97.8 ?F (36.6 ?C) 98.2 ?F (36.8 ?C)  ?TempSrc:  Temporal    ?SpO2: 97% 99% 96% 98%  ?Weight:  42.2 kg    ?Height:  5\' 4"  (1.626 m)    ? ?Weight change:  ? ?Intake/Output Summary (Last 24 hours) at 07/12/2021 1100 ?Last data filed at 07/12/2021 1009 ?Gross per 24 hour  ?Intake 1118.69 ml  ?Output --  ?Net 1118.69 ml  ? ?Constitutional: Elderly thin female laying comfortably in bed, NAD ?HEENT: Normocephalic, atraumatic, mild decreased skin turgor to upper chest   ?Cardio: normal rate and rhythm, no murmurs ?Pulm: no increased work of breathing on room air ?Abd: soft, non-tender, non-distended, + normal bowel sounds ?Extremities: moving all extremities  spontaneously, mild decreased skin turgor to bilateral LE, no edema ?MSK: decreased bulk and tone ?Neuro: Alert and oriented x3, answering questions appropriately ?Psych: normal affect ? ?Assessment/Plan: ? ?Principal Problem: ?  GI bleed ?Active Problems: ?  Vitamin D deficiency ?  HYPERCHOLESTEROLEMIA ?  HIP PAIN, RIGHT, CHRONIC ?  Osteoarthritis of both hips ?  History of subdural hematoma ?  CKD (chronic kidney disease) stage 3, GFR 30-59 ml/min (HCC) ? ?Victoria Ball is a 88 y.o. female with hx of prior subdural hematoma, osteoporosis, chronic pain 2/2 arthritis, and hypertension admitted with hematemesis and melena secondary to upper GI bleed. ? ?Upper GI bleed 2/2 to PUD ?Normocytic anemia ?Endoscopy today showing tortuous esophagus with irregular z-line, 2cm hiatal hernia, erosive gastritis with multiple gastric ulcers, and non-bleeding ulcers in the duodenum. EGD confirms upper GI bleed 2/2 to PUD. We will continue IV PPI for 48hr and then transition patient to po medications. Per GI, will start Carafate with meals and at bedtime for 1 month. Hgb decreased today to 9.2 from 11.1 on admission. Normocytic anemia secondary to above, will give 1x IV iron infusion today and continue to trend Hgb. Patient will need to follow up with GI outpatient provider, will need repeat EGD in 3-4 months to ensure healing. ?-continue IV Protonix 80mg  daily for 48hrs, then transition to po medications ?-start Carafate with each meal and at bedtime for 1 month ?-  trend Hgb, transfuse if Hgb <8 ?-1x IV iron infusion today  ?-diet: full liquid diet, advance diet as able ?-appreciate GI recs ?  ?Syncopal episode ?Generalized weakness ?Suspect syncopal episode 2/2 to dehydration in setting of poor po intake. Patient continues to be mildly volume down on exam today with mild decreased skin turgor to bilateral LE and upper chest. Blood pressures today are in normal range and patient diet advance to liquid diet today. Will continue fluids  today and encourage increased po intake. Will reassess tomorrow. PT consulted for eval and treatment as necessary. ?-continue 67ml/hr NS ?-PT eval and treat ?  ?Left buttock pressure wound, chronic ?Unstable housing ?Stage 2 pressure wound on left buttock present on admission. Wound care saw patient today virtually, no drainage or signs of infection, noted wound healing well. From imaging wound seems to be chronic for patient, no pain to site today. Will implement wound care recs and continue to monitor for signs of infection. It is unclear if patient is receiving appropriate care at home. Today became teary and stated unsure if she could continue living with either of her daughters. Became guarded on further questioning. Will consult social work today to determine if patient receiving adequate support at home and if she can safely discharge home. ?-Wound care consulted, appreciate recs ?-CTM ?-Consult social work ? ?Hx CKD IIIB, chronic, stable ?Currently unclear what baseline is, suspect baseline ~1.2, working on obtaining records. CT pelvis on admission with senescent left renal changes with chronic, severe hydronephrosis and parenchymal atrophy. Creatinine remains stable. Patient is volume down on exam today, will continue on fluids as above. CTM. ?-daily CMP ?-fluids as above ?  ?Chronic pain ?Hx opiate use disorder, hx of opiate withdrawal ?Unilateral primary osteoarthritis, left hip ?03/23 xray of hip showing, unchanged moderate bilateral femoroacetabular and moderate pubic symphysis joint space narrowing with degenerative changes, unchanged from previous 08/19. Was seen by Dr. Edmonia James on 06/17/21 at [place], external referral to Dr. Primus Bravo for long term med management placed at that time. Pain predominantly of bilateral hips with hx of chronic pain treated with opioids, previously managed by Dr. Mee Hives at [facility]. Relationship with pain clinic ended earlier this month due to suspected  overuse of rx. Before discontinuation regimen included Oxy 10mg  q6h prn. Patients daughter states that she has not taken any opioids in one month. Will avoid use of opioids, has Tylenol prn on board for pain management. Pain at baseline. ?-Tylenol 650 mg q6h prn ?-Unlikely, but COWS for opioid withdrawal monitoring ?  ?HTN/HLD, chronic ?Patient previously on Atorvastatin 40mg , Metoprolol-XR 50mg  daily, Amlodipine 5mg  daily, Telmisartan 40mg  daily. Recently discharged from PCP for opioid prescription abuse, no longer taking statin or ARB as a result of not being able to refill meds. Will continue home medications as below today. Will need to be connected with PCP on discharge. ?-continue Metoprolol-XR 50mg  daily, Amlodipine 5mg  daily ?  ?Reported hx dementia ?After obatining colateral information from patients daughter it is unclear if patient acutally has this diagnosis. She is a poor historian but is able to tell when she does not recall information and directs to who might be able to answer the question. Given age and possible history might be more prone to hospital delirium. Continues to be alert and oriented x3 today, will monitor during admission. ?-Delirium precautions ?  ?Hx Vitamin D deficiency / osteoporosis  ?Patient with hx of Vit D deficiency and osteoporosis, previously taking raloxifene, stopped after 5 years. Per  daughter, also not taking vit D supplements because she does no want to. ? ? LOS: 0 days  ? ?Ball, Victoria Reichert, Medical Student ?07/12/2021, 11:00 AM  ? ?Attestation for Student Documentation: ? ?I personally was present and performed or re-performed the history, physical exam and medical decision-making activities of this service and have verified that the service and findings are accurately documented in the student?s note. ? ?Wayland Denis, MD ?07/12/2021, 3:21 PM  ?

## 2021-07-12 NOTE — Progress Notes (Signed)
Initial Nutrition Assessment ? ?DOCUMENTATION CODES:  ? ?Underweight, Severe malnutrition in context of chronic illness ? ?INTERVENTION:  ? ?Multivitamin w/ minerals daily ?Ensure Enlive po TID, each supplement provides 350 kcal and 20 grams of protein. ?Meal ordering with assist ? ?NUTRITION DIAGNOSIS:  ? ?Severe Malnutrition related to chronic illness (CKD) as evidenced by severe muscle depletion, severe fat depletion. ? ?GOAL:  ? ?Patient will meet greater than or equal to 90% of their needs ? ?MONITOR:  ? ?PO intake, Supplement acceptance, Diet advancement, Labs, Weight trends, Skin ? ?REASON FOR ASSESSMENT:  ? ?Malnutrition Screening Tool ?  ? ?ASSESSMENT:  ? ?88 y.o. female presented to the ED with hematemesis and melena. PMH includes HTN, CKD III, and subdural hematoma. Pt admitted with upper GI bleed, and generalized weakness.  ? ?3/31 - EGD ? ?Pt sitting up in bed, eating lunch at time of RD visit. No family at bedside.  ? ?Pt reports that her appetite was good at home. Unable to provide examples of foods that she eats at home. Pt stated that whatever her daughter cooks. Pt reports that she was drink 1 Ensure at home per day.  ? ?Pt reports that she is unsure if she has lost any weight recently. No weights within the past year to assess weight loss. Pt reports that she use to walk with a walker, but is not as mobile anymore.  ? ?Will order pt with ONS.  ? ?Medications reviewed and include: Sucralfate, IV Protonix ?Labs reviewed: BUN 42, Creatinine 1.04 ? ?NUTRITION - FOCUSED PHYSICAL EXAM: ? ?Flowsheet Row Most Recent Value  ?Orbital Region Severe depletion  ?Upper Arm Region Severe depletion  ?Thoracic and Lumbar Region Severe depletion  ?Buccal Region Severe depletion  ?Temple Region Severe depletion  ?Clavicle Bone Region Severe depletion  ?Clavicle and Acromion Bone Region Severe depletion  ?Scapular Bone Region Severe depletion  ?Dorsal Hand Severe depletion  ?Patellar Region Severe depletion   ?Anterior Thigh Region Severe depletion  ?Posterior Calf Region Severe depletion  ?Edema (RD Assessment) None  ?Hair Reviewed  ?Eyes Reviewed  ?Mouth Reviewed  ?Skin Reviewed  ?Nails Reviewed  ? ?Diet Order:   ?Diet Order   ? ?       ?  Diet full liquid Room service appropriate? Yes; Fluid consistency: Thin  Diet effective now       ?  ? ?  ?  ? ?  ? ? ?EDUCATION NEEDS:  ? ?No education needs have been identified at this time ? ?Skin:  Skin Assessment: Skin Integrity Issues: ?Skin Integrity Issues:: Stage II ?Stage II: L Buttocks ? ?Last BM:  3/29 ? ?Height:  ? ?Ht Readings from Last 1 Encounters:  ?07/12/21 5\' 4"  (1.626 m)  ? ? ?Weight:  ? ?Wt Readings from Last 1 Encounters:  ?07/12/21 42.2 kg  ? ? ?Ideal Body Weight:  54.6 kg ? ?BMI:  Body mass index is 15.97 kg/m?. ? ?Estimated Nutritional Needs:  ? ?Kcal:  1400-1600 ? ?Protein:  70-95 grams ? ?Fluid:  >/= 1.5 L ? ? ? ?Hermina Barters RD, LDN ?Clinical Dietitian ?See AMiON for contact information.  ? ?

## 2021-07-12 NOTE — Consult Note (Signed)
WOC Nurse Consult Note: ?Patient receiving care in [facility] [phone] ?Consult completed remotely after review of chart, images and [place] to the bedside RN Greggory Brandy ?Reason for Consult: left buttock stage 2 ?Wound type: Left buttock wound that appears to have been larger and some point due to the surrounding pink scar tissue (see photo in media tab) One open area that is pink with no drainage.  ?Pressure Injury POA: Yes ?Measurement: Entire discolored surface area is 5 cm x 2.5 cm ?Wound bed: ?Drainage (amount, consistency, odor)  ?Periwound: Surrounding scar tissue that is pink and some purplish hue  ?Dressing procedure/placement/frequency: ?Continue sacral foam dressing, assessing under the dressing daily. Change every 3 days or PRN soiling. Turn patient every 2-4 hours to shift weight and prevent evolvement of the wound.  ? ?Monitor the wound area(s) for worsening of condition such as: ?Signs/symptoms of infection, increase in size, development of or worsening of odor, ?development of pain, or increased pain at the affected locations.   ?Notify the medical team if any of these develop. ? ?Thank you for the consult. [place] nurse will not follow at this time.   ?Please re-consult the [facility] [place] team if needed. ? ?Cathlean Marseilles. Tamala Julian, MSN, RN, CMSRN, AGCNS, WTA ?Wound Treatment Associate ?Pager [phone]   ? ? ?  ?

## 2021-07-12 NOTE — Op Note (Signed)
[facility] ?Patient Name: Victoria Ball ?Procedure Date : 07/12/2021 ?MRN: [id number] ?Attending MD: Justice Britain , MD ?Date of Birth: March 10, 1926 ?CSN: [id number] ?Age: 88 ?Admit Type: Inpatient ?Procedure:                Upper GI endoscopy ?Indications:              Epigastric abdominal pain, Acute post hemorrhagic  ?                          anemia, Melena, Occult blood in stool, Recent  ?                          gastrointestinal bleeding, Suspected upper  ?                          gastrointestinal bleeding ?Providers:                Justice Britain, MD, Jaci Carrel, RN,  ?                          Gloris Ham, Technician, [employer]  ?                          Technician, Technician ?Referring MD:             Pricilla Riffle. Fuller Plan, MD, [facility] ?Medicines:                Monitored Anesthesia Care ?Complications:            No immediate complications. ?Estimated Blood Loss:     Estimated blood loss was minimal. ?Procedure:                Pre-Anesthesia Assessment: ?                          - Prior to the procedure, a History and Physical  ?                          was performed, and patient medications and  ?                          allergies were reviewed. The patient's tolerance of  ?                          previous anesthesia was also reviewed. The risks  ?                          and benefits of the procedure and the sedation  ?                          options and risks were discussed with the patient.  ?                          All questions were answered, and informed consent  ?                          was obtained.  Prior Anticoagulants: The patient has  ?                          taken no previous anticoagulant or antiplatelet  ?                          agents except for NSAID medication. ASA Grade  ?                          Assessment: III - A patient with severe systemic  ?                          disease. After reviewing the risks and benefits,  ?                           the patient was deemed in satisfactory condition to  ?                          undergo the procedure. ?                          After obtaining informed consent, the endoscope was  ?                          passed under direct vision. Throughout the  ?                          procedure, the patient's blood pressure, pulse, and  ?                          oxygen saturations were monitored continuously. The  ?                          GIF-H190 ([id number]) Olympus endoscope was introduced  ?                          through the mouth, and advanced to the second part  ?                          of duodenum. The upper GI endoscopy was  ?                          accomplished without difficulty. The patient  ?                          tolerated the procedure. ?Scope In: ?Scope Out: ?Findings: ?     The examined esophagus was moderately tortuous. ?     No gross mucosal lesions were noted in the entire esophagus. ?     The Z-line was irregular and was found 39 cm from the incisors. ?     A 2 cm hiatal hernia was present. ?     Segmental severe inflammation characterized by erosions, friability,  ?     granularity and shallow ulcers were found throughout the entire examined  ?     stomach.  Biopsies were taken with a cold forceps for histology and  ?     Helicobacter pylori testing. ?     Three non-bleeding cratered duodenal ulcers with a clean ulcer base  ?     (Forrest Class III) were found in the duodenal bulb, and the D1/D2  ?     sweep. The largest lesion was 30 mm in largest dimension, the other 2  ?     ulcers were between 15 mm and 25 mm as well. ?     No gross lesions were noted in the second portion of the duodenum. ?Impression:               - Tortuous esophagus. No gross mucosal lesions in  ?                          esophagus. ?                          - Z-line irregular, 39 cm from the incisors. ?                          - 2 cm hiatal hernia noted. ?                          - Erosive  gastritis and gastric ulcers present.  ?                          Biopsied. ?                          - Non-bleeding duodenal ulcers with a clean ulcer  ?                          base (Forrest Class III) in bulb and D1/D2 sweep.  ?                          No gross lesions in the second portion of the  ?                          duodenum. ?Recommendation:           - The patient will be observed post-procedure,  ?                          until all discharge criteria are met. ?                          - Return patient to hospital ward for ongoing care. ?                          - Full liquid diet. ?                          - IV PPI drip for 48 more hours. Then transition to  ?                          PO PPI BID  thereafter. ?                          - Start Carafate with each meal and at bedtime for  ?                          1 month. ?                          - Administer 1 dose of IV Iron while in house. ?                          - Trend Hgb/Hct. ?                          - Expect melena/dark stools for next few days. No  ?                          plans for rescoping unless transfusion dependent  ?                          anemia develops or overt hemodynamically unstable  ?                          bleeding were to occur at which point repeat EGD is  ?                          reasonable. Then VIR intervention if not able to  ?                          control endoscopically. ?                          - Follow up pathology. ?                          - Repeat EGD in 3-4 months with primary GI in  ?                          outpatient setting to ensure healing. Be also  ?                          prepared that she could develop stricturing in the  ?                          future. ?                          - We will follow up tomorrow. ?                          - The findings and recommendations were discussed  ?                          with the patient. ?                          -  The findings  and recommendations were discussed  ?                          with the referring physician. ?Procedure Code(s):        --- Professional --- ?                          [phone], Esophagogastroduodenoscopy, flexible,  ?                          transoral; with biopsy, single or multiple ?Diagnosis Code(s):        --- Professional --- ?                          Q39.9, Congenital malformation of esophagus,  ?                          unspecified ?                          K22.8, Other specified diseases of esophagus ?                          K29.60, Other gastritis without bleeding ?                          K26.9, Duodenal ulcer, unspecified as acute or  ?                          chronic, without hemorrhage or perforation ?                          R10.13, Epigastric pain ?                          D62, Acute posthemorrhagic anemia ?                          K92.1, Melena (includes Hematochezia) ?                          R19.5, Other fecal abnormalities ?                          K92.2, Gastrointestinal hemorrhage, unspecified ?CPT copyright 2019 [employer] Association. All rights reserved. ?The codes documented in this report are preliminary and upon coder review may  ?be revised to meet current compliance requirements. ?Justice Britain, MD ?07/12/2021 10:30:54 AM ?Number of Addenda: 0 ?

## 2021-07-13 DIAGNOSIS — D62 Acute posthemorrhagic anemia: Secondary | ICD-10-CM

## 2021-07-13 DIAGNOSIS — K2990 Gastroduodenitis, unspecified, without bleeding: Secondary | ICD-10-CM

## 2021-07-13 DIAGNOSIS — K2970 Gastritis, unspecified, without bleeding: Secondary | ICD-10-CM

## 2021-07-13 DIAGNOSIS — K259 Gastric ulcer, unspecified as acute or chronic, without hemorrhage or perforation: Secondary | ICD-10-CM

## 2021-07-13 DIAGNOSIS — K449 Diaphragmatic hernia without obstruction or gangrene: Secondary | ICD-10-CM

## 2021-07-13 DIAGNOSIS — K2091 Esophagitis, unspecified with bleeding: Secondary | ICD-10-CM

## 2021-07-13 LAB — CBC
HCT: 23.9 % — ABNORMAL LOW (ref 36.0–46.0)
Hemoglobin: 8 g/dL — ABNORMAL LOW (ref 12.0–15.0)
MCH: 30.4 pg (ref 26.0–34.0)
MCHC: 33.5 g/dL (ref 30.0–36.0)
MCV: 90.9 fL (ref 80.0–100.0)
Platelets: 415 10*3/uL — ABNORMAL HIGH (ref 150–400)
RBC: 2.63 MIL/uL — ABNORMAL LOW (ref 3.87–5.11)
RDW: 14.6 % (ref 11.5–15.5)
WBC: 11.5 10*3/uL — ABNORMAL HIGH (ref 4.0–10.5)
nRBC: 0 % (ref 0.0–0.2)

## 2021-07-13 LAB — HEMOGLOBIN AND HEMATOCRIT, BLOOD
HCT: 25.4 % — ABNORMAL LOW (ref 36.0–46.0)
HCT: 23 % — ABNORMAL LOW (ref 36.0–46.0)
HCT: 25.3 % — ABNORMAL LOW (ref 36.0–46.0)
Hemoglobin: 8.7 g/dL — ABNORMAL LOW (ref 12.0–15.0)
Hemoglobin: 7.5 g/dL — ABNORMAL LOW (ref 12.0–15.0)
Hemoglobin: 8.6 g/dL — ABNORMAL LOW (ref 12.0–15.0)

## 2021-07-13 LAB — BASIC METABOLIC PANEL
Anion gap: 5 (ref 5–15)
BUN: 43 mg/dL — ABNORMAL HIGH (ref 8–23)
CO2: 18 mmol/L — ABNORMAL LOW (ref 22–32)
Calcium: 7.8 mg/dL — ABNORMAL LOW (ref 8.9–10.3)
Chloride: 116 mmol/L — ABNORMAL HIGH (ref 98–111)
Creatinine, Ser: 1.06 mg/dL — ABNORMAL HIGH (ref 0.44–1.00)
GFR, Estimated: 49 mL/min — ABNORMAL LOW (ref >60)
Glucose, Bld: 101 mg/dL — ABNORMAL HIGH (ref 70–99)
Potassium: 4.1 mmol/L (ref 3.5–5.1)
Sodium: 139 mmol/L (ref 135–145)

## 2021-07-13 LAB — PREPARE RBC (CROSSMATCH)

## 2021-07-13 MED ORDER — SODIUM CHLORIDE 0.9% IV SOLUTION: Freq: Once | INTRAVENOUS | Status: AC

## 2021-07-13 MED ORDER — LACTATED RINGERS IV SOLN: INTRAVENOUS | Status: AC

## 2021-07-13 MED ORDER — PANTOPRAZOLE SODIUM 40 MG PO TBEC
40.0000 mg | DELAYED_RELEASE_TABLET | Freq: Two times a day (BID) | ORAL | Status: DC
  Administered 2021-07-13 – 2021-07-14 (×3): 40 mg via ORAL
  Filled 2021-07-13 (×3): qty 1

## 2021-07-13 MED ORDER — TRAMADOL HCL 50 MG PO TABS
50.0000 mg | ORAL_TABLET | Freq: Two times a day (BID) | ORAL | Status: DC | PRN
  Administered 2021-07-13 – 2021-07-14 (×2): 50 mg via ORAL
  Filled 2021-07-13 (×2): qty 1

## 2021-07-13 NOTE — Progress Notes (Signed)
Physical Therapy Evaluation ?Patient Details ?Name: Victoria Ball ?MRN: [id number] ?DOB: [birth year] ?Today's Date: 07/13/2021 ? ?History of Present Illness ? 88 y.o. female  presenting 07/11/21 with hematemesis, melena, and chronic pain. +syncopal episode; +GI bleed   PMH subdural hematoma, osteoporosis, chronic pain 2/2 arthritis, and hypertension  ?Clinical Impression ?  ?Pt admitted secondary to problem above with deficits below. PTA patient was living with daughter and ambulating with a rollator (?with assist, pt not clear in history).  Pt currently requires mod assist to sit EOB and refused to attempt standing due to rt hip pain and lack of rollator (refused to attempt use of RW). Patient adamant re: not transferring onto her feet and returned herself to supine despite max encouragement from PT. Explained need to keep her strength up and to show MD that she can walk well enough to go home and pt adamant that the doctor will not keep her here. Anticipate patient will benefit from PT to address problems listed below.Will continue to follow acutely to maximize functional mobility independence and safety.   ?   ?   ? ?Recommendations for follow up therapy are one component of a multi-disciplinary discharge planning process, led by the attending physician.  Recommendations may be updated based on patient status, additional functional criteria and insurance authorization. ? ?Follow Up Recommendations Home health PT ? ?  ?Assistance Recommended at Discharge Intermittent Supervision/Assistance  ?Patient can return home with the following ? A little help with walking and/or transfers;A little help with bathing/dressing/bathroom;Direct supervision/assist for medications management;Help with stairs or ramp for entrance ? ?  ?Equipment Recommendations None recommended by PT  ?Recommendations for Other Services ? OT consult  ?  ?Functional Status Assessment Patient has had a recent decline in their functional status and  demonstrates the ability to make significant improvements in function in a reasonable and predictable amount of time.  ? ?  ?Precautions / Restrictions Precautions ?Precautions: Fall  ? ?  ? ?Mobility ? Bed Mobility ?Overal bed mobility: Needs Assistance ?Bed Mobility: Supine to Sit, Sit to Supine ?  ?  ?Supine to sit: Mod assist ?Sit to supine: Min guard ?  ?General bed mobility comments: Patient refused to roll on her side and insisted on pulling straight up from supine requiring mod assist; pt wanting to return to supine and did not require physical assist ?  ? ?Transfers ?  ?  ?  ?  ?  ?  ?  ?  ?  ?General transfer comment: pt refused to attempt; pushing to lie back down ?  ? ?Ambulation/Gait ?  ?  ?  ?  ?  ?  ?  ?  ? ?Stairs ?  ?  ?  ?  ?  ? ?Wheelchair Mobility ?  ? ?Modified Rankin (Stroke Patients Only) ?  ? ?  ? ?Balance Overall balance assessment: Needs assistance ?Sitting-balance support: Bilateral upper extremity supported, Feet unsupported ?Sitting balance-Leahy Scale: Poor ?Sitting balance - Comments: difficult to fully assess due to pt not wanting to stay sitting and trying to lie down ?  ?  ?  ?  ?  ?  ?  ?  ?  ?  ?  ?  ?  ?  ?  ?   ? ? ? ?Pertinent Vitals/Pain Pain Assessment ?Pain Assessment: Faces ?Faces Pain Scale: Hurts whole lot ?Pain Location: rt hip ?Pain Descriptors / Indicators: Aching ?Pain Intervention(s): Limited activity within patient's tolerance, Monitored during session, Repositioned  ? ? ?Home  Living Family/patient expects to be discharged to:: Private residence ?Living Arrangements: Children ?Available Help at Discharge: Family;Available 24 hours/day ?Type of Home: House ?Home Access: Stairs to enter ?Entrance Stairs-Rails: Right ?Entrance Stairs-Number of Steps: 3 ?  ?Home Layout: One level ?Home Equipment: Rollator (4 wheels) ?Additional Comments: may have other DME; pt poor historian  ?  ?Prior Function Prior Level of Function : Needs assist ?  ?  ?  ?  ?  ?  ?Mobility Comments:  states walks with rollator (/needs assist) ?  ?  ? ? ?Hand Dominance  ?   ? ?  ?Extremity/Trunk Assessment  ? Upper Extremity Assessment ?Upper Extremity Assessment: Generalized weakness ?  ? ?Lower Extremity Assessment ?Lower Extremity Assessment: Generalized weakness ?  ? ?Cervical / Trunk Assessment ?Cervical / Trunk Assessment: Kyphotic  ?Communication  ? Communication: No difficulties  ?Cognition Arousal/Alertness: Awake/alert ?Behavior During Therapy: Anxious ?Overall Cognitive Status: No family/caregiver present to determine baseline cognitive functioning ?  ?  ?  ?  ?  ?  ?  ?  ?  ?  ?  ?  ?  ?  ?  ?  ?General Comments: pt adamant that she cannot try to stand due to painful hip; when asked what she would do if she was at home and had to use the bathroom, she said she would walk but she doesn't have to do that now because she has the purewick ?  ?  ? ?  ?General Comments   ? ?  ?Exercises    ? ?Assessment/Plan  ?  ?PT Assessment Patient needs continued PT services  ?PT Problem List Decreased strength;Decreased activity tolerance;Decreased balance;Decreased mobility;Decreased cognition;Decreased knowledge of use of DME;Pain ? ?   ?  ?PT Treatment Interventions DME instruction;Gait training;Stair training;Functional mobility training;Therapeutic activities;Therapeutic exercise;Balance training;Patient/family education   ? ?PT Goals (Current goals can be found in the Care Plan section)  ?Acute Rehab PT Goals ?Patient Stated Goal: go home soon ?PT Goal Formulation: With patient ?Time For Goal Achievement: 07/27/21 ?Potential to Achieve Goals: Good ? ?  ?Frequency Min 3X/week ?  ? ? ?Co-evaluation   ?  ?  ?  ?  ? ? ?  ?AM-PAC PT "6 Clicks" Mobility  ?Outcome Measure Help needed turning from your back to your side while in a flat bed without using bedrails?: A Little ?Help needed moving from lying on your back to sitting on the side of a flat bed without using bedrails?: A Lot ?Help needed moving to and from a bed  to a chair (including a wheelchair)?: Total ?Help needed standing up from a chair using your arms (e.g., wheelchair or bedside chair)?: Total ?Help needed to walk in hospital room?: Total ?Help needed climbing 3-5 steps with a railing? : Total ?6 Click Score: 9 ? ?  ?End of Session   ?Activity Tolerance: Patient limited by pain ?Patient left: in bed;with call bell/phone within reach;with bed alarm set ?Nurse Communication: Mobility status;Other (comment) (pt reporting she does not have to get up due to purewich) ?PT Visit Diagnosis: Muscle weakness (generalized) (M62.81);Difficulty in walking, not elsewhere classified (R26.2) ?  ? ?Time: 4098-1191 ?PT Time Calculation (min) (ACUTE ONLY): 15 min ? ? ?Charges:   PT Evaluation ?$PT Eval Low Complexity: 1 Low ?  ?  ?   ? ? ? ?Jerolyn Center, PT ?Acute Rehabilitation Services  ?Pager [phone] ?Office [phone] ? ? ?Scherrie November Naasia Weilbacher ?07/13/2021, 9:32 AM ?

## 2021-07-13 NOTE — Progress Notes (Signed)
Instructed pt to reach out to daughter to bring in Rolator walker. Explained inability to send her home unless we can see that is able to mobilize. Pt very insistent that she cannot walk with front wheel walker and will "only walk with Rolator". ?

## 2021-07-13 NOTE — Progress Notes (Signed)
?   07/13/21 1600  ?Assess: MEWS Score  ?BP (!) 105/58  ?Pulse Rate 98  ?ECG Heart Rate 100  ?Resp 20  ?Level of Consciousness Alert  ?SpO2 99 %  ?O2 Device Room Air  ?Assess: MEWS Score  ?MEWS Temp 0  ?MEWS Systolic 0  ?MEWS Pulse 0  ?MEWS RR 0  ?MEWS LOC 0  ?MEWS Score 0  ?MEWS Score Color Green  ?Assess: if the MEWS score is Yellow or Red  ?Were vital signs taken at a resting state? Yes  ?Focused Assessment No change from prior assessment  ?Treat  ?Pain Scale 0-10  ?Pain Score Asleep  ?Patients Stated Pain Goal 0  ?Document  ?Patient Outcome Stabilized after interventions  ? ? ?

## 2021-07-13 NOTE — Progress Notes (Signed)
?   07/13/21 1400  ?Vitals  ?BP (!) 119/56  ?MAP (mmHg) 73  ?BP Location Left Arm  ?BP Method Automatic  ?Patient Position (if appropriate) Lying  ?Pulse Rate 97  ?Pulse Rate Source Monitor  ?ECG Heart Rate 96  ?Resp 18  ?Level of Consciousness  ?Level of Consciousness Alert  ?MEWS COLOR  ?MEWS Score Color Green  ?Oxygen Therapy  ?SpO2 98 %  ?O2 Device Room Air  ?Pain Assessment  ?Pain Scale 0-10  ?Pain Score 0  ?Patients Stated Pain Goal 0  ?MEWS Score  ?MEWS Temp 0  ?MEWS Systolic 0  ?MEWS Pulse 0  ?MEWS RR 0  ?MEWS LOC 0  ?MEWS Score 0  ? ? ?

## 2021-07-13 NOTE — Progress Notes (Signed)
On call MD, Dr. Welton Flakes notified of pt's scores from [facility]. Requested PRN medication for PRN scores/range. No new orders noted.  ?

## 2021-07-13 NOTE — Progress Notes (Signed)
?   07/13/21 1400  ?Assess: MEWS Score  ?BP (!) 119/56  ?Pulse Rate 97  ?ECG Heart Rate 96  ?Resp 18  ?Level of Consciousness Alert  ?Assess: MEWS Score  ?MEWS Temp 0  ?MEWS Systolic 0  ?MEWS Pulse 0  ?MEWS RR 0  ?MEWS LOC 0  ?MEWS Score 0  ?MEWS Score Color Green  ?Treat  ?Pain Scale 0-10  ?Pain Score 0  ?Patients Stated Pain Goal 0  ?Document  ?Patient Outcome Stabilized after interventions  ? ? ?

## 2021-07-13 NOTE — Progress Notes (Addendum)
? ?Subjective: ? ?Victoria Ball. Patient states she is not feeling better today and would like to go home. On clarification, states her hips are hurting due to arthritis pain and that tylenol has not helped, is requesting Oxy. Amenable to trial with alternative pain medication and heating pad. ? ?States she cannot walk due to arthritis but is willing to try to work with PT today. Would like to work with [place] at home. States she would like to return to daughter Victoria Ball's house in [place] and if she can't will purchase a place of her own. ? ?Has not had n/v or abd pain, has had BM but has not checked color or for blood. No appetite today, but eating meals. No difficulty with urination or changes to urination pattern. ? ?Objective: ? ?Vital signs in last 24 hours: ?Vitals:  ? 07/12/21 1935 07/12/21 2333 07/13/21 0344 07/13/21 0749  ?BP: (!) 104/55 118/62 116/71 136/69  ?Pulse: 92 80 87 95  ?Resp: 20 20 20 19   ?Temp: 98.4 ?F (36.9 ?C) 98.4 ?F (36.9 ?C) 98 ?F (36.7 ?C) 98.4 ?F (36.9 ?C)  ?TempSrc: Oral Oral Oral Oral  ?SpO2: 98% 100% 97% 98%  ?Weight:      ?Height:      ? ?Weight change: 0 kg ? ?Intake/Output Summary (Last 24 hours) at 07/13/2021 1036 ?Last data filed at 07/12/2021 [id number] ?Gross per 24 hour  ?Intake 806.38 ml  ?Output 450 ml  ?Net 356.38 ml  ? ?Physical Exam: ?Constitutional: Elderly female laying in bed, alert and conversant, NAD ?HEENT: Normocephalic, atraumatic, moderate decreased skin turgor to upper chest ?Cardio: normal rate and rhythm, no murmurs ?Pulm: no increased work of breathing on room air ?Abd: soft, non-tender, non-distended, + normal bowel sounds ?Extremities: moving all extremities spontaneously, moderate decreased skin turgor to bilateral LE, no edema ?MSK: decreased bulk and tone ?Neuro: Alert and oriented x3, answering questions appropriately ?Psych: normal affect ? ?Assessment/Plan: ? ?Principal Problem: ?  GI bleed ?Active Problems: ?  Vitamin D deficiency ?  HYPERCHOLESTEROLEMIA ?  HIP PAIN,  RIGHT, CHRONIC ?  Osteoarthritis of both hips ?  History of subdural hematoma ?  CKD (chronic kidney disease) stage 3, GFR 30-59 ml/min (HCC) ?  Protein-calorie malnutrition, severe ? ?Victoria Ball is a 88 y.o. female with hx of prior subdural hematoma, osteoporosis, chronic pain 2/2 arthritis, and hypertension admitted with hematemesis and melena secondary to upper GI bleed. ?  ?Upper GI bleed 2/2 to PUD ?Normocytic anemia ?Patient asymptomatic this AM. Plan to transition to po PPI medications today and continue Carafate as below. Hgb further decreased today to 8 from 11.1 on admission. Normocytic anemia secondary to above, will give 1x unit of pRBC today and continue to trend Hgb. Will need 1x IV iron infusion before discharge, per GI. Patient will need to follow up with GI outpatient provider, will need repeat EGD in 3-4 months to ensure healing. ?-d/c IV Protonix 80mg  daily, start Protonix 40mg  po BID ?-Continue Carafate with each meal and at bedtime for 1 month (end May 1) ?-trend Hgb, transfuse if Hgb <8 ?-IV iron infusion before discharge ?-diet: full liquid diet, advance diet as able ?-appreciate GI recs ?  ?Syncopal episode ?Generalized weakness ?Underweight, Severe malnutrition in context of chronic illness ?Suspect syncopal episode 2/2 to dehydration in setting of poor po intake. Patient continues to be volume down on exam today with moderate decreased skin turgor to bilateral LE and upper chest. Blood pressures today are in normal range and she remains on  liquid diet, will advance diet as able per GI recs. Checked with nurse yesterday was eating meals/drinking, encouraged increased po intake today. Patient seen by dietician, will implement recs. Continuing fluids today as below and will reassess fluid status tomorrow. Patient seen by PT today, refused to attempt standing due to right hip pain and lack of Rolator. Addressing pain as bellow. PT recommending HHPT on discharge and OT consult, will consult for  eval and treatment as necessary. ?-continue 164ml/hr LR ?-PT/OT eval and treat ?-appreciate dietician recs ?-HHPT on discharge ?  ?Left buttock pressure wound, chronic ?Unstable housing ?Stage 2 pressure wound on left buttock present on admission. Seen by wound care, no drainage or signs of infection, noted wound healing well. From imaging wound seems to be chronic for patient, no pain to site today. Will continue to implement wound care recs and monitor for signs of infection. It is unclear if patient is receiving appropriate care at home. Today sates she would like to return to daughter Victoria Ball's house in [place] (previously living with daughter Victoria Ball before admission) and if she can't will purchase a place of her own. SW consulted yesterday but unable to see patient. Will need to work with SW to ensure patient can safely discharge home. ?-Wound care consulted, appreciate recs ?-CTM ?-Consult social work ?  ?Hx CKD IIIB, chronic, stable ?Currently unclear what baseline is, suspect baseline ~1.2, working on obtaining records. CT pelvis on admission with senescent left renal changes with chronic, severe hydronephrosis and parenchymal atrophy. Creatinine remains stable. Patient is volume down on exam today, will continue on fluids as above. CTM. ?-daily CMP ?-fluids as above ?  ?Chronic pain ?Hx opiate use disorder, hx of opiate withdrawal ?Unilateral primary osteoarthritis, left hip ?Patient seen by Dr. Edmonia James on 06/17/21 at [facility], external referral to Dr. Primus Bravo for long term med management placed at that time. Pain predominantly of bilateral hips with hx of chronic pain treated with opioids, previously managed by Dr[facility] Mee Hives at [facility]. Relationship with pain clinic ended earlier this month due to suspected overuse of rx. Before discontinuation regimen included Oxy 10mg  q6h prn. No opioid use in one month, per patients daughter. Today patient requesting Oxy for right hip pain, states Tylenol is not  helpful. Will continue to avoid use of opioids, has Tylenol and tramadol prn on board for pain management.  ?-Tylenol 1000 mg q6h prn, Tramadol 50mg  q12hr prn ?-Unlikely, but COWS for opioid withdrawal monitoring ?  ?HTN/HLD, chronic ?Patient previously on Atorvastatin 40mg , Metoprolol-XR 50mg  daily, Amlodipine 5mg  daily, Telmisartan 40mg  daily. Recently discharged from PCP for opioid prescription abuse, no longer taking statin or ARB as a result of not being able to refill meds. Will continue home medications as below today. Will need to be connected with PCP on discharge. ?-continue Metoprolol-XR 50mg  daily, Amlodipine 5mg  daily ?  ?Reported hx dementia ?After obatining colateral information from patients daughter it is unclear if patient acutally has this diagnosis. She is a poor historian but is able to tell when she does not recall information and directs to who might be able to answer the question. Given age and possible history might be more prone to hospital delirium. Continues to be alert and oriented x3 today, will monitor during admission. ?-Delirium precautions ?  ?Hx Vitamin D deficiency / osteoporosis  ?Patient with hx of Vit D deficiency and osteoporosis, previously taking raloxifene, stopped after 5 years. Per daughter, also not taking vit D supplements because she does no want  to. ? ? ? LOS: 1 day  ? ?Rodriguez-Teodoro, Quintella Reichert, Medical Student ?07/13/2021, 10:36 AM  ? ?Attestation for Student Documentation: ? ?I personally was present and performed or re-performed the history, physical exam and medical decision-making activities of this service and have verified that the service and findings are accurately documented in the student?s note. ? ?Iona Beard, MD ?07/13/2021, 11:48 AM ? ?

## 2021-07-13 NOTE — Progress Notes (Signed)
Internal Medicine MD, advise to hold off on PRBC transfusion, pt. Hgb 8.7, will transfuse if less than 8. Awaiting GI recs. ?

## 2021-07-13 NOTE — Progress Notes (Signed)
? ?   Progress Note ? ? Subjective  ?Chief Complaint: GI bleed coffee-ground emesis ? ?07/12/2021 patient had endoscopy that showed 2 cm hiatal hernia, erosive esophagitis and gastric ulcers pending biopsy. ?Patient sitting by window seat. ?Patient had 1 black loose stool last night, has not had another bowel movement since that time. ?Denies any nausea vomiting or abdominal pain. ?Is complaining of leg pain bilaterally generalized weakness ? ? Objective  ? ?Vital signs in last 24 hours: ?Temp:  [98 ?F (36.7 ?C)-98.9 ?F (37.2 ?C)] 98.9 ?F (37.2 ?C) (04/01 1152) ?Pulse Rate:  [80-109] 109 (04/01 1152) ?Resp:  [19-20] 20 (04/01 1152) ?BP: (89-136)/(50-71) 89/50 (04/01 1152) ?SpO2:  [97 %-100 %] 97 % (04/01 1152) ?Last BM Date : 07/13/21 ? ?General: Chronically ill-appearing, thin appearing female. ?Heart:  Regular rate and rhythm; no murmurs ?Pulm: Clear anteriorly; no wheezing ?Abdomen:  Soft, Non-distended AB, Active bowel sounds. No tenderness . , No organomegaly appreciated. ?Extremities:  without  edema. ?Neurologic:  Alert and  oriented x4;  No focal deficits.  Equal strength bilateral lower legs. ?Psych:  Cooperative. Normal mood and affect. ? ? ?Intake/Output from previous day: ?03/31 0701 - 04/01 0700 ?In: 1006.4 [I.V.:1006.4] ?Out: 450 [Urine:450] ?Intake/Output this shift: ?No intake/output data recorded. ? ?Lab Results: ?Recent Labs  ?  07/11/21 ?0518 07/11/21 ?3358 07/12/21 ?0424 07/12/21 ?1118 07/12/21 ?1755 07/13/21 ?0308 07/13/21 ?0913  ?WBC 16.1*  --  13.8*  --   --  11.5*  --   ?HGB 11.1*   < > 9.2*   < > 8.0* 8.0* 8.7*  ?HCT 34.2*   < > 28.4*   < > 25.3* 23.9* 25.4*  ?PLT 455*  --  384  --   --  415*  --   ? < > = values in this interval not displayed.  ? ? ?BMET ?Recent Labs  ?  07/11/21 ?2518 07/11/21 ?9842 07/12/21 ?0424 07/13/21 ?0308  ?NA 139 139 136 139  ?K 4.4 4.4 3.8 4.1  ?CL 111 109 110 116*  ?CO2 19*  --  20* 18*  ?GLUCOSE 126* 122* 77 101*  ?BUN 41* 40* 42* 43*  ?CREATININE 1.14* 1.10*  1.04* 1.06*  ?CALCIUM 7.7*  --  7.7* 7.8*  ? ? ?LFT ?Recent Labs  ?  07/12/21 ?0424  ?PROT 4.8*  ?ALBUMIN 2.5*  ?AST 14*  ?ALT 9  ?ALKPHOS 64  ?BILITOT 0.5  ? ? ?PT/INR ?Recent Labs  ?  07/11/21 ?1006  ?LABPROT 14.4  ?INR 1.1  ? ? ? ?Studies/Results: ?No results found. ? ? ? Impression/Plan:  ? ?88 year old female coffee-ground emesis, had EGD that showed 2 cm hiatal hernia, erosive esophagitis and gastric ulcers  ?pending biopsy. ?CBC on 07/13/2021   ?WBC 11.5 HGB 8.7 MCV 90.9 Platelets 415 ?Hemoglobin stable this morning did have 1 episode of black stool but this is expected. ?-Continue to monitor H&H with transfusion as needed to maintain hemoglobin greater than 8 given cardiac history. ?Need to be on IV drip 48 hours and then transition to p.o. twice daily. ?Carafate 4 times daily for 1 month. ?Follow-up outpatient with possible endoscopy 3 to 4 months ?Would likely benefit from IV iron infusion prior to discharge. ? ? ? ? LOS: 1 day  ? ?Doree Albee  07/13/2021, 12:51 PM ? ? ? ? ?

## 2021-07-14 ENCOUNTER — Encounter (HOSPITAL_COMMUNITY): Payer: Self-pay | Admitting: Gastroenterology

## 2021-07-14 LAB — TYPE AND SCREEN
ABO/RH(D): O NEG
ABO/RH(D): O NEG
Antibody Screen: NEGATIVE
Antibody Screen: NEGATIVE
Unit division: 0

## 2021-07-14 LAB — CBC
HCT: 26.7 % — ABNORMAL LOW (ref 36.0–46.0)
Hemoglobin: 8.6 g/dL — ABNORMAL LOW (ref 12.0–15.0)
MCH: 30.5 pg (ref 26.0–34.0)
MCHC: 32.2 g/dL (ref 30.0–36.0)
MCV: 94.7 fL (ref 80.0–100.0)
Platelets: 324 10*3/uL (ref 150–400)
RBC: 2.82 MIL/uL — ABNORMAL LOW (ref 3.87–5.11)
RDW: 15 % (ref 11.5–15.5)
WBC: 12.3 10*3/uL — ABNORMAL HIGH (ref 4.0–10.5)
nRBC: 0 % (ref 0.0–0.2)

## 2021-07-14 LAB — BASIC METABOLIC PANEL
Anion gap: 8 (ref 5–15)
BUN: 40 mg/dL — ABNORMAL HIGH (ref 8–23)
CO2: 20 mmol/L — ABNORMAL LOW (ref 22–32)
Calcium: 8.1 mg/dL — ABNORMAL LOW (ref 8.9–10.3)
Chloride: 112 mmol/L — ABNORMAL HIGH (ref 98–111)
Creatinine, Ser: 1.09 mg/dL — ABNORMAL HIGH (ref 0.44–1.00)
GFR, Estimated: 47 mL/min — ABNORMAL LOW (ref >60)
Glucose, Bld: 106 mg/dL — ABNORMAL HIGH (ref 70–99)
Potassium: 4.1 mmol/L (ref 3.5–5.1)
Sodium: 140 mmol/L (ref 135–145)

## 2021-07-14 LAB — IRON AND TIBC
Iron: 210 ug/dL — ABNORMAL HIGH (ref 28–170)
Saturation Ratios: 87 % — ABNORMAL HIGH (ref 10.4–31.8)
TIBC: 241 ug/dL — ABNORMAL LOW (ref 250–450)
UIBC: 31 ug/dL

## 2021-07-14 LAB — FERRITIN: Ferritin: 20 ng/mL (ref 11–307)

## 2021-07-14 LAB — BPAM RBC
Blood Product Expiration Date: 202304052359
ISSUE DATE / TIME: 202304011825
Unit Type and Rh: 9500

## 2021-07-14 LAB — HEMOGLOBIN AND HEMATOCRIT, BLOOD
HCT: 26 % — ABNORMAL LOW (ref 36.0–46.0)
Hemoglobin: 8.9 g/dL — ABNORMAL LOW (ref 12.0–15.0)

## 2021-07-14 MED ORDER — SUCRALFATE 1 GM/10ML PO SUSP
1.0000 g | Freq: Three times a day (TID) | ORAL | 0 refills | Status: AC
Start: 2021-07-14 — End: 2021-08-13

## 2021-07-14 MED ORDER — SUCRALFATE 1 GM/10ML PO SUSP
1.0000 g | Freq: Three times a day (TID) | ORAL | 0 refills | Status: DC
Start: 2021-07-14 — End: 2021-07-14

## 2021-07-14 MED ORDER — PANTOPRAZOLE SODIUM 40 MG PO TBEC: 40.0000 mg | DELAYED_RELEASE_TABLET | Freq: Two times a day (BID) | ORAL | 0 refills | Status: AC

## 2021-07-14 MED ORDER — PANTOPRAZOLE SODIUM 40 MG PO TBEC
40.0000 mg | DELAYED_RELEASE_TABLET | Freq: Two times a day (BID) | ORAL | 0 refills | Status: DC
Start: 2021-07-14 — End: 2021-07-14

## 2021-07-14 NOTE — Discharge Instructions (Addendum)
Dear Ms. Victoria Ball, ? ?It was pleasure taking care of you in the hospital.  You were admitted for a GI bleed due to having ulcers in your stomach and duodenum.  Please start taking pantoprazole 40 mg twice daily. Start taking Carafate 10 mL 4 times a day with meals and at bedtime, continue this for 1 month. ? ?Please follow-up with internal medicine clinic on Thursday, July 18, 2021 for hospital follow-up and labs.  We are located on the ground floor of [facility].  We will arrange follow-up with our clinic and will call you to give you an appointment time.  We will also need GI follow-up in 3 to 4 months which Dr. Pierre Bali will arrange. Please also make a follow up with your primary care doctor (Dr. Lucretia Roers) in about 2 weeks.  ?

## 2021-07-14 NOTE — Anesthesia Postprocedure Evaluation (Signed)
Anesthesia Post Note ? ?Patient: Victoria Ball ? ?Procedure(s) Performed: ESOPHAGOGASTRODUODENOSCOPY (EGD) WITH PROPOFOL ?BIOPSY ? ?  ? ?Patient location during evaluation: PACU ?Anesthesia Type: MAC ?Level of consciousness: awake and alert ?Pain management: pain level controlled ?Vital Signs Assessment: post-procedure vital signs reviewed and stable ?Respiratory status: spontaneous breathing, nonlabored ventilation, respiratory function stable and patient connected to nasal cannula oxygen ?Cardiovascular status: stable and blood pressure returned to baseline ?Postop Assessment: no apparent nausea or vomiting ?Anesthetic complications: no ? ? ?No notable events documented. ? ?Last Vitals:  ?Vitals:  ? 07/14/21 0751 07/14/21 1230  ?BP: (!) 156/87 (!) 106/55  ?Pulse: 95 83  ?Resp: 17 20  ?Temp: 36.8 ?C (!) 36.3 ?C  ?SpO2: 99% 97%  ?  ?Last Pain:  ?Vitals:  ? 07/14/21 1230  ?TempSrc: Oral  ?PainSc:   ? ? ?  ?  ?  ?  ?  ?  ? ?Pike Scantlebury ? ? ? ? ?

## 2021-07-14 NOTE — Progress Notes (Signed)
? ?   Progress Note ? ? Subjective  ?Chief Complaint: GI bleed coffee-ground emesis ? ?07/12/2021 patient had endoscopy that showed 2 cm hiatal hernia, erosive esophagitis and gastric ulcers pending biopsy. ?Patient had 1 black stool last night hemoglobin went down to 7.5, received 1 packed red blood cells. ?No more black stools at this time. ? ? Objective  ? ?Vital signs in last 24 hours: ?Temp:  [97.3 ?F (36.3 ?C)-99.5 ?F (37.5 ?C)] 97.3 ?F (36.3 ?C) (04/02 1230) ?Pulse Rate:  [80-98] 83 (04/02 1230) ?Resp:  [17-23] 20 (04/02 1230) ?BP: (97-156)/(55-87) 106/55 (04/02 1230) ?SpO2:  [97 %-99 %] 97 % (04/02 1230) ?Last BM Date : 07/13/21 ? ?General: Chronically ill-appearing, thin appearing female. ?Heart:  Regular rate and rhythm; no murmurs ?Pulm: Clear anteriorly; no wheezing ?Abdomen:  Soft, Non-distended AB, Active bowel sounds. No tenderness . , No organomegaly appreciated. ?Extremities:  without  edema. ?Neurologic:  Alert and  oriented x4;  No focal deficits.  Equal strength bilateral lower legs. ?Psych:  Cooperative. Normal mood and affect. ? ? ?Intake/Output from previous day: ?04/01 0701 - 04/02 0700 ?In: 808.4 [P.O.:237; I.V.:239.4; Blood:332] ?Out: 400 [Urine:400] ?Intake/Output this shift: ?No intake/output data recorded. ? ?Lab Results: ?Recent Labs  ?  07/12/21 ?0424 07/12/21 ?1118 07/13/21 ?0308 07/13/21 ?0354 07/13/21 ?2233 07/14/21 ?6568 07/14/21 ?0920  ?WBC 13.8*  --  11.5*  --   --  12.3*  --   ?HGB 9.2*   < > 8.0*   < > 8.6* 8.6* 8.9*  ?HCT 28.4*   < > 23.9*   < > 25.3* 26.7* 26.0*  ?PLT 384  --  415*  --   --  324  --   ? < > = values in this interval not displayed.  ? ?BMET ?Recent Labs  ?  07/12/21 ?0424 07/13/21 ?0308 07/14/21 ?0318  ?NA 136 139 140  ?K 3.8 4.1 4.1  ?CL 110 116* 112*  ?CO2 20* 18* 20*  ?GLUCOSE 77 101* 106*  ?BUN 42* 43* 40*  ?CREATININE 1.04* 1.06* 1.09*  ?CALCIUM 7.7* 7.8* 8.1*  ? ?LFT ?Recent Labs  ?  07/12/21 ?0424  ?PROT 4.8*  ?ALBUMIN 2.5*  ?AST 14*  ?ALT 9  ?ALKPHOS 64   ?BILITOT 0.5  ? ?PT/INR ?No results for input(s): LABPROT, INR in the last 72 hours. ? ?Studies/Results: ?No results found. ? ? ? Impression/Plan:  ? ?88 year old female coffee-ground emesis, had EGD that showed 2 cm hiatal hernia, erosive esophagitis and gastric ulcers  ?pending biopsy.  ?CBC on 07/14/2021   ?WBC 12.3 HGB 8.9 MCV 94.7 Platelets 324 ?Anemia studies on 07/14/2021  ?Iron 210 Ferritin 20  ?Patient had 1 episode of small black stool yesterday, hemoglobin 7.5 on 4/01 down from 8.7.  Transfused 1 unit packed red blood cells last night.  This morning currently at 8.9. ? ?Hemoglobin stable, no more episodes of rebleeding. ?Could potentially monitor H&H for 1 more day, if patient has transfusion dependent anemia would need to potentially do relook endoscopy. ?-Continue to monitor H&H with transfusion as needed to maintain hemoglobin greater than 8 given cardiac history. ?Transition from IV Protonix to p.o. 40 mg twice daily. ?Had first dose of Carafate 4 times daily 07/12/2021 will need this for 1 month. ?Follow-up outpatient with possible endoscopy 3 to 4 months ? ? ? ? LOS: 2 days  ? ?Doree Albee  07/14/2021, 1:05 PM ? ? ? ? ?

## 2021-07-14 NOTE — Discharge Summary (Signed)
? ?Name: Victoria Ball ?MRN: [id number] ?DOB: 11/11/1925 88 y.o. ?PCP: Pcp, No ? ?Date of Admission: 07/11/2021  9:33 AM ?Date of Discharge: No discharge date for patient encounter. ?Attending Physician: Angelica Pou, MD ? ?Discharge Diagnosis: ?1. Upper GI bleed secondary to erosive gastropathy, gastric ulcers ?2. Non bleeding duodenal ulcers ?3. Chronic pain secondary to osteoarthritis with prior long term opoid use ?4. CKD IIIa ?5. Protein-calorie malnutrition ? ?Discharge Medications: ?Allergies as of 07/14/2021   ? ?   Reactions  ? Hydrochlorothiazide   ? Low na and high cr on this   ? ?  ? ?  ?Medication List  ?  ? ?STOP taking these medications   ? ?amitriptyline 25 MG tablet ?Commonly known as: ELAVIL ?  ?lisinopril 10 MG tablet ?Commonly known as: ZESTRIL ?  ? ?  ? ?TAKE these medications   ? ?amLODipine 5 MG tablet ?Commonly known as: NORVASC ?TAKE 1 TABLET BY MOUTH EVERY DAY ?  ?ketoconazole 2 % shampoo ?Commonly known as: Nizoral ?Apply 1 application topically 2 (two) times a week. Shampoo scalp twice weekly and leave on several minutes before rinsing ?What changed: additional instructions ?  ?metoprolol succinate 50 MG 24 hr tablet ?Commonly known as: TOPROL-XL ?Take 50 mg by mouth daily. ?  ?pantoprazole 40 MG tablet ?Commonly known as: PROTONIX ?Take 1 tablet (40 mg total) by mouth 2 (two) times daily. ?  ?sucralfate 1 GM/10ML suspension ?Commonly known as: CARAFATE ?Take 10 mLs (1 g total) by mouth 4 (four) times daily -  with meals and at bedtime. ?  ?TYLENOL PO ?Take 2 tablets by mouth daily as needed (pain). ?  ? ?  ? ?  ?  ? ? ?  ?Discharge Care Instructions  ?(From admission, onward)  ?  ? ? ?  ? ?  Start     Ordered  ? 07/14/21 0000  Discharge wound care:       ?Comments: Keep pressure wound clean and try. Have patient shift every few hours to avoid pressure on one area to avoid worsening.  ? 07/14/21 1725  ? ?  ?  ? ?  ? ? ?Disposition and follow-up:   ?VictoriaVictoria Ball was discharged  from [facility] in Stable condition.  At the hospital follow up visit please address: ? ?1.  Upper GI bleed- check hemoglobin and for sign of further GI bleed, encourage patient to avoid NSAID, please ensure patient and family aware of GI followup  ? ?Leukocytosis- Check CBC ? ?2.  Labs / imaging needed at time of follow-up: CBC ? ?3.  Pending labs/ test needing follow-up: surgical path(gi biopsy) ? ?Follow-up Appointments: ? Follow-up Information   ? ? [facility] up.   ?Contact information: ?[address] [place] ?[place] ?[phone] ? ?  ?  ? ?  ?  ? ?  ? ? ?Hospital Course by problem list: ?Victoria Ball is a 88 y.o. female with hx of prior subdural hematoma, osteoporosis, chronic pain 2/2 arthritis, and hypertension admitted with hematemesis and melena secondary to upper GI bleed. ?  ?Upper GI bleed 2/2 to PUD ?Normocytic anemia ?Patient presented after having bright red emesis for 2 days prior to admission in addition to melena.  Chronically poor food intake and drinks very little water.  She previously was on oxycodone and followed by pain Dr. Laurin Coder at [facility] but was recently dismissed due to concern for inappropriately using her  opioid medication.  Was recently prescribed naproxen in the ED on 06/17/2021 for her chronic hip pain.  Suspect GI bleed associated with NSAID usage.  Noted to have a normocytic anemia with hemoglobin of 11.11 on admission.  She underwent EGD on 3/31 with 2 cm hiatal hernia, erosive gastritis with gastric ulcers which were biopsied, and nonbleeding duodenal ulcers.  She was started on pantoprazole infusion before being transitioned to oral pantoprazole twice daily.  Also started on Carafate.  Did not have any further episodes of bloody emesis or dark bowel movements during her admission.  Notable that her hemoglobin trended down during admission requiring 1 unit of packed red blood cells.  Iron  studies revealed low ferritin of 20, iron 210, TIBC 241, saturation 87% could be related to anemia of chronic inflammation although ferritin is on the low side.  On day of discharge hemoglobin improved to 8.9.  Patient eating well with normal bowel movements.  Patient without further signs of bleeding.  She was able to walk with PT and did not require any follow-up or have any dizziness.  Patient very eager to go home with her daughter [name].  Her neighbor Remo Lipps is also very helpful for getting her medications and providing her transportation.  She will need close follow-up on 4/6 at the [facility] for repeat labs.  She also has a PCP Dr. Alba Cory per daughter [name].  She will also need GI follow-up in 3 to 4 months may need follow-up EGD.  Dr. Hervey Ard will arrange. I discussed this with the patient, daughter, and Remo Lipps who are agreeable for plan for discharge ?  ?Syncopal episode ?Generalized weakness ?Underweight, Severe malnutrition in context of chronic illness ?Suspect syncopal episode 2/2 to dehydration in setting of poor po intake.  Improved with IV fluids and encouraged p.o. intake.  She was started on Ensure supplements during her stay.  Patient tolerating regular diet on discharge.  No further episodes of syncope.  Worked well with PT without further episodes of dizziness or syncope.  No PT follow-up up recommended.   ? ?Left buttock pressure wound, chronic ?Stage 2 pressure wound on left buttock present on admission. Seen by wound care, no drainage or signs of infection, noted wound healing well. From imaging wound seems to be chronic for patient, patient has not experienced pain from these wounds.  Continued wound care in the hospital and regular turning. ?  ?Hx CKD IIIB, chronic, stable ?Currently unclear what baseline is, suspect baseline ~1.2 based on records from 2022. CT pelvis on admission with senescent left renal changes with chronic, severe hydronephrosis and parenchymal atrophy.  Right kidney  unremarkable.  Any function stable around 1 during this admission.  Elevated at 40.  Will need outpatient follow-up of kidney function. ?  ?Chronic pain ?Hx opiate use disorder, hx of opiate withdrawal ?Unilateral primary osteoarthritis, left hip ?Patient previously seen by Dr. Edmonia James on 06/17/21 at [facility], external referral to Dr. Primus Bravo for long term med management placed at that time. Pain predominantly of bilateral hips with hx of chronic pain treated with opioids, previously managed by Dr. Laurin Coder at [facility]. Relationship with pain clinic ended earlier this month due to suspected overuse of rx. Before discontinuation regimen included Oxy 10mg  q6h prn. No opioid use in one month, per patients daughter.  No signs of withdrawal during admission.  Did require tramadol during her hospitalization.  We recommended Tylenol as needed for pain.  Would recommend avoiding NSAID usage given her gastric ulcers.   ? ?  Leukocytosis ?Patient noted to have a leukocytosis of 12 on day of discharge.  Patient is eating well, having normal bowel movements, without signs of further GI bleeding.  Patient is eager to discharge home.  Will need outpatient CBC to evaluate for resolution. ? ?Unstable housing ?Patient previously staying with younger daughter [name] for the past 4 years.  Recently moved to stay with [name] due to [name] being concerned that patient was not providing enough care.  Patient did express concern about going home and being a burden on family.  She describes that she would prefer obtaining her own housing in the near future and living with a friend.  For now she will discharge home to [place].  Her friend Remo Lipps also lives nearby and is very helpful in obtaining her medications as well as providing her with transportation. ? ?Discharge Exam:   ?BP (!) 106/55 (BP Location: Left Arm)   Pulse 83   Temp (!) 97.3 ?F (36.3 ?C) (Oral)   Resp 20   Ht 5\' 4"  (1.626 m)   Wt 42.2 kg   LMP 04/14/1977   SpO2  97%   BMI 15.97 kg/m?  ?Discharge exam:  ?Constitutional: Elderly female laying in bed, alert and conversant, NAD ?HEENT: Normocephalic, atraumatic, moderate decreased skin turgor to upper chest ?Cardio: RRR, no murmur

## 2021-07-14 NOTE — Progress Notes (Signed)
Physical Therapy Treatment ?Patient Details ?Name: Victoria Ball ?MRN: [id number] ?DOB: 1925-06-23 ?Today's Date: 07/14/2021 ? ? ?History of Present Illness 88 y.o. female  presenting 07/11/21 with hematemesis, melena, and chronic pain. +syncopal episode; +GI bleed   PMH subdural hematoma, osteoporosis, chronic pain 2/2 arthritis, and hypertension ? ?  ?PT Comments  ? ? Patient much more cooperative today and asking when she can go home. She reports she has supervision/assistance provided by her daughter and demonstrated OOB, transfer, and ambulation with at most minguard assist. OK for discharge from a mobility/PT perspective.  ?   ?Recommendations for follow up therapy are one component of a multi-disciplinary discharge planning process, led by the attending physician.  Recommendations may be updated based on patient status, additional functional criteria and insurance authorization. ? ?Follow Up Recommendations ? No PT follow up ?  ?  ?Assistance Recommended at Discharge Intermittent Supervision/Assistance  ?Patient can return home with the following A little help with walking and/or transfers;A little help with bathing/dressing/bathroom;Direct supervision/assist for medications management;Help with stairs or ramp for entrance ?  ?Equipment Recommendations ? None recommended by PT  ?  ?Recommendations for Other Services   ? ? ?  ?Precautions / Restrictions Precautions ?Precautions: Fall ?Restrictions ?Weight Bearing Restrictions: No  ?  ? ?Mobility ? Bed Mobility ?Overal bed mobility: Modified Independent ?Bed Mobility: Supine to Sit, Sit to Supine ?  ?  ?Supine to sit: Modified independent (Device/Increase time) ?Sit to supine: Modified independent (Device/Increase time) ?  ?General bed mobility comments: pt able to come to sit EOB without assist (including managing linens) ?  ? ?Transfers ?Overall transfer level: Needs assistance ?Equipment used: Rolling walker (2 wheels) ?Transfers: Sit to/from Stand ?Sit to  Stand: Supervision ?  ?  ?  ?  ?  ?General transfer comment: pt accustomed to rollator, but agreed to use available RW; she reaches to pull up on RW as she would do with locked rollator; supervision for safety; stood from EOB and from standard toilet ?  ? ?Ambulation/Gait ?Ambulation/Gait assistance: Supervision ?Gait Distance (Feet): 25 Feet (toileted; 15) ?Assistive device: Rolling walker (2 wheels) ?Gait Pattern/deviations: Step-through pattern, Decreased stride length, Trunk flexed ?  ?Gait velocity interpretation: 1.31 - 2.62 ft/sec, indicative of limited community ambulator ?  ?General Gait Details: pt steady and able to maneuver RW herself without assist ? ? ?Stairs ?  ?  ?  ?  ?  ? ? ?Wheelchair Mobility ?  ? ?Modified Rankin (Stroke Patients Only) ?  ? ? ?  ?Balance Overall balance assessment: Needs assistance ?Sitting-balance support: Feet unsupported, No upper extremity supported ?Sitting balance-Leahy Scale: Fair ?  ?  ?Standing balance support: No upper extremity supported, During functional activity ?Standing balance-Leahy Scale: Fair ?Standing balance comment: able to pull down/up her underwear as she toileted ?  ?  ?  ?  ?  ?  ?  ?  ?  ?  ?  ?  ? ?  ?Cognition Arousal/Alertness: Awake/alert ?Behavior During Therapy: Flat affect ?Overall Cognitive Status: No family/caregiver present to determine baseline cognitive functioning ?  ?  ?  ?  ?  ?  ?  ?  ?  ?  ?  ?  ?  ?  ?  ?  ?General Comments: following all commands; turning appropriate direction withRW in regards to lines/monitor ?  ?  ? ?  ?Exercises   ? ?  ?General Comments   ?  ?  ? ?Pertinent Vitals/Pain Pain Assessment ?Pain Assessment:  Faces ?Faces Pain Scale: Hurts little more ?Pain Location: rt hip ?Pain Descriptors / Indicators: Aching ?Pain Intervention(s): Limited activity within patient's tolerance, Monitored during session  ? ? ?Home Living   ?  ?  ?  ?  ?  ?  ?  ?  ?  ?   ?  ?Prior Function    ?  ?  ?   ? ?PT Goals (current goals can now  be found in the care plan section) Acute Rehab PT Goals ?Patient Stated Goal: go home soon ?Time For Goal Achievement: 07/27/21 ?Potential to Achieve Goals: Good ?Progress towards PT goals: Progressing toward goals ? ?  ?Frequency ? ? ? Min 3X/week ? ? ? ?  ?PT Plan Discharge plan needs to be updated  ? ? ?Co-evaluation   ?  ?  ?  ?  ? ?  ?AM-PAC PT "6 Clicks" Mobility   ?Outcome Measure ? Help needed turning from your back to your side while in a flat bed without using bedrails?: None ?Help needed moving from lying on your back to sitting on the side of a flat bed without using bedrails?: None ?Help needed moving to and from a bed to a chair (including a wheelchair)?: A Little ?Help needed standing up from a chair using your arms (e.g., wheelchair or bedside chair)?: A Little ?Help needed to walk in hospital room?: A Little ?Help needed climbing 3-5 steps with a railing? : A Little ?6 Click Score: 20 ? ?  ?End of Session Equipment Utilized During Treatment: Gait belt ?Activity Tolerance: Patient tolerated treatment well ?Patient left: in bed;with call bell/phone within reach;with bed alarm set (refused sitting in recliner due to "too hard and painful") ?Nurse Communication: Mobility status ?PT Visit Diagnosis: Muscle weakness (generalized) (M62.81);Difficulty in walking, not elsewhere classified (R26.2) ?  ? ? ?Time: 1610-9604 ?PT Time Calculation (min) (ACUTE ONLY): 14 min ? ?Charges:  $Gait Training: 8-22 mins          ?          ? ? ?Jerolyn Center, PT ?Acute Rehabilitation Services  ?Pager [phone] ?Office [phone] ? ? ? ?Scherrie November Lenn Volker ?07/14/2021, 9:11 AM ? ?

## 2021-07-15 ENCOUNTER — Telehealth: Payer: Self-pay

## 2021-07-15 NOTE — Telephone Encounter (Signed)
-----   Message from Meryl Dare, MD sent at 07/13/2021 10:26 AM EDT ----- ?Regarding: RE: Follow-up ?Victoria Ball,  ?Please schedule office follow up office in 1 month with me or an APP - AE or AC as they saw her in the hospital. EGD can be scheduled at that visit.  ?GM,  ?FYI: Sharyon Medicus, RN is my nurse.  ?Thanks,  ?MS ?----- Message ----- ?From: Mansouraty, Netty Starring., MD ?Sent: 07/12/2021   9:09 PM EDT ?To: Annett Fabian, RN, Meryl Dare, MD ?Subject: Follow-up                                     ? ?MS, ?This patient came into the hospital with dark stools and melena.  Large duodenal ulcers and significant gastric inflammation/ulcers.  Biopsies are pending. ?Needs repeat endoscopy in 3 to 4 months to ensure healing. ?I will update you when pathology returns. ?Likely to be discharged by Sunday or Monday coming up. ? ?SJ please work on scheduling this patient for follow-up with MS or one of the APP's in 4 weeks and follow-up endoscopy in [facility] versus hospital pending anesthesia discussion. ? ?Thanks. ?GM ? ? ?

## 2021-07-15 NOTE — Telephone Encounter (Signed)
Spoke with patient regarding follow up. Patient is scheduled with Dr. Russella Dar on 08/22/21 at 9:50 am.  ?

## 2021-07-16 LAB — SURGICAL PATHOLOGY

## 2021-07-17 ENCOUNTER — Encounter: Payer: Self-pay | Admitting: Gastroenterology

## 2021-07-17 ENCOUNTER — Other Ambulatory Visit: Payer: Self-pay

## 2021-07-17 DIAGNOSIS — A048 Other specified bacterial intestinal infections: Secondary | ICD-10-CM

## 2021-07-17 MED ORDER — TALICIA 250-12.5-10 MG PO CPDR: 4.0000 | DELAYED_RELEASE_CAPSULE | Freq: Three times a day (TID) | ORAL | 0 refills | Status: AC

## 2021-07-18 ENCOUNTER — Encounter: Payer: Medicare PPO | Admitting: Internal Medicine

## 2021-07-23 ENCOUNTER — Emergency Department (HOSPITAL_COMMUNITY)
Admission: EM | Admit: 2021-07-23 | Discharge: 2021-07-24 | Disposition: A | Discharge status: Home / Self Care | Payer: Medicare PPO | Source: Home / Self Care | Attending: Emergency Medicine | Admitting: Emergency Medicine

## 2021-07-23 DIAGNOSIS — M25551 Pain in right hip: Secondary | ICD-10-CM | POA: Insufficient documentation

## 2021-07-23 DIAGNOSIS — R000 Tachycardia, unspecified: Secondary | ICD-10-CM | POA: Insufficient documentation

## 2021-07-23 DIAGNOSIS — N3090 Cystitis, unspecified without hematuria: Secondary | ICD-10-CM | POA: Insufficient documentation

## 2021-07-23 DIAGNOSIS — Z515 Encounter for palliative care: Secondary | ICD-10-CM | POA: Diagnosis not present

## 2021-07-23 DIAGNOSIS — K631 Perforation of intestine (nontraumatic): Secondary | ICD-10-CM | POA: Diagnosis not present

## 2021-07-23 NOTE — Progress Notes (Signed)
I have not seen this patient.

## 2021-07-23 NOTE — ED Triage Notes (Signed)
Pt arrived to er via [facility], complaining of pain in the rt hip that started this evening. Also complaining of abdominal pain ?

## 2021-07-24 ENCOUNTER — Emergency Department (HOSPITAL_COMMUNITY): Payer: Medicare PPO

## 2021-07-24 ENCOUNTER — Encounter (HOSPITAL_COMMUNITY): Payer: Self-pay

## 2021-07-24 ENCOUNTER — Encounter: Payer: Medicare PPO | Admitting: Internal Medicine

## 2021-07-24 ENCOUNTER — Other Ambulatory Visit: Payer: Self-pay

## 2021-07-24 LAB — COMPREHENSIVE METABOLIC PANEL
ALT: 13 U/L (ref 0–44)
AST: 15 U/L (ref 15–41)
Albumin: 3.1 g/dL — ABNORMAL LOW (ref 3.5–5.0)
Alkaline Phosphatase: 94 U/L (ref 38–126)
Anion gap: 6 (ref 5–15)
BUN: 26 mg/dL — ABNORMAL HIGH (ref 8–23)
CO2: 25 mmol/L (ref 22–32)
Calcium: 8.6 mg/dL — ABNORMAL LOW (ref 8.9–10.3)
Chloride: 111 mmol/L (ref 98–111)
Creatinine, Ser: 1.28 mg/dL — ABNORMAL HIGH (ref 0.44–1.00)
GFR, Estimated: 39 mL/min — ABNORMAL LOW (ref >60)
Glucose, Bld: 104 mg/dL — ABNORMAL HIGH (ref 70–99)
Potassium: 3.5 mmol/L (ref 3.5–5.1)
Sodium: 142 mmol/L (ref 135–145)
Total Bilirubin: 0.7 mg/dL (ref 0.3–1.2)
Total Protein: 5.7 g/dL — ABNORMAL LOW (ref 6.5–8.1)

## 2021-07-24 LAB — URINALYSIS, ROUTINE W REFLEX MICROSCOPIC
Bilirubin Urine: NEGATIVE
Glucose, UA: NEGATIVE mg/dL
Hgb urine dipstick: NEGATIVE
Ketones, ur: 5 mg/dL — AB
Nitrite: NEGATIVE
Protein, ur: NEGATIVE mg/dL
Specific Gravity, Urine: 1.034 — ABNORMAL HIGH (ref 1.005–1.030)
pH: 5 (ref 5.0–8.0)

## 2021-07-24 LAB — CBC WITH DIFFERENTIAL/PLATELET
Abs Immature Granulocytes: 0.05 10*3/uL (ref 0.00–0.07)
Basophils Absolute: 0 10*3/uL (ref 0.0–0.1)
Basophils Relative: 0 %
Eosinophils Absolute: 0.2 10*3/uL (ref 0.0–0.5)
Eosinophils Relative: 2 %
HCT: 30.9 % — ABNORMAL LOW (ref 36.0–46.0)
Hemoglobin: 9.9 g/dL — ABNORMAL LOW (ref 12.0–15.0)
Immature Granulocytes: 0 %
Lymphocytes Relative: 23 %
Lymphs Abs: 2.5 10*3/uL (ref 0.7–4.0)
MCH: 30.3 pg (ref 26.0–34.0)
MCHC: 32 g/dL (ref 30.0–36.0)
MCV: 94.5 fL (ref 80.0–100.0)
Monocytes Absolute: 0.6 10*3/uL (ref 0.1–1.0)
Monocytes Relative: 6 %
Neutro Abs: 7.8 10*3/uL — ABNORMAL HIGH (ref 1.7–7.7)
Neutrophils Relative %: 69 %
Platelets: 639 10*3/uL — ABNORMAL HIGH (ref 150–400)
RBC: 3.27 MIL/uL — ABNORMAL LOW (ref 3.87–5.11)
RDW: 14.9 % (ref 11.5–15.5)
WBC: 11.2 10*3/uL — ABNORMAL HIGH (ref 4.0–10.5)
nRBC: 0 % (ref 0.0–0.2)

## 2021-07-24 LAB — LACTIC ACID, PLASMA
Lactic Acid, Venous: 0.9 mmol/L (ref 0.5–1.9)
Lactic Acid, Venous: 0.7 mmol/L (ref 0.5–1.9)

## 2021-07-24 LAB — LIPASE, BLOOD: Lipase: 28 U/L (ref 11–51)

## 2021-07-24 MED ORDER — CEPHALEXIN 500 MG PO CAPS: 500.0000 mg | ORAL_CAPSULE | Freq: Two times a day (BID) | ORAL | 0 refills | Status: AC

## 2021-07-24 MED ORDER — SODIUM CHLORIDE 0.9 % IV BOLUS
500.0000 mL | Freq: Once | INTRAVENOUS | Status: AC
  Administered 2021-07-24: 500 mL via INTRAVENOUS

## 2021-07-24 MED ORDER — IOHEXOL 300 MG/ML  SOLN
60.0000 mL | Freq: Once | INTRAMUSCULAR | Status: AC | PRN
  Administered 2021-07-24: 60 mL via INTRAVENOUS

## 2021-07-24 MED ORDER — LIDOCAINE 5 % EX PTCH: 1.0000 | MEDICATED_PATCH | CUTANEOUS | 0 refills | Status: AC

## 2021-07-24 MED ORDER — LIDOCAINE 5 % EX PTCH
1.0000 | MEDICATED_PATCH | CUTANEOUS | Status: DC
  Administered 2021-07-24: 1 via TRANSDERMAL
  Filled 2021-07-24: qty 1

## 2021-07-24 MED ORDER — FENTANYL CITRATE PF 50 MCG/ML IJ SOSY
50.0000 ug | PREFILLED_SYRINGE | Freq: Once | INTRAMUSCULAR | Status: AC
  Administered 2021-07-24: 50 ug via INTRAVENOUS
  Filled 2021-07-24: qty 1

## 2021-07-24 MED ORDER — OXYCODONE HCL 5 MG PO TABS
5.0000 mg | ORAL_TABLET | Freq: Once | ORAL | Status: AC
  Administered 2021-07-24: 5 mg via ORAL
  Filled 2021-07-24: qty 1

## 2021-07-24 NOTE — ED Notes (Signed)
Waiting on her daughter to arrange someone to come get her ?

## 2021-07-24 NOTE — ED Notes (Signed)
Gone to ct 

## 2021-07-24 NOTE — ED Provider Notes (Signed)
?[facility] EMERGENCY DEPARTMENT ?Provider Note ? ? ?CSN: [id number] ?Arrival date & time: 07/23/21  2354 ? ?  ? ?History ? ?Chief Complaint  ?Patient presents with  ? Hip Pain  ? ? ?Victoria Ball is a 88 y.o. female. ? ?The history is provided by the patient, medical records and a relative.  ?Hip Pain ?Victoria Ball is a 88 y.o. female who presents to the Emergency Department complaining of hip pain.  She presents to the emergency department by EMS from home for evaluation of atraumatic right hip pain.  She has a history of chronic pain but states over the last 24 hours her pain in her right hip has significantly worsened.  Is located in the lateral hip and radiates to the right mid posterior thigh.  She has associated nausea as well as right lower quadrant abdominal pain.  No fevers, vomiting.  She does have occasional dysuria.  No diarrhea or constipation.  She normally ambulates with a walker but is unable to ambulate secondary to pain.  She lives with her daughter.  She has been trying Aleve at home without improvement.  She has been on oxycodone 10mg  at home for pain but her pain mgmt recently stopped writing for this medication due to concern for overuse.  ? ?  ? ?Home Medications ?Prior to Admission medications   ?Medication Sig Start Date End Date Taking? Authorizing Provider  ?Amoxicill-Rifabutin-Omeprazole (TALICIA) 250-12.5-10 MG CPDR Take 4 capsules by mouth with breakfast, with lunch, and with evening meal for 14 days. 07/17/21 07/31/21  08/02/21, MD  ?cephALEXin (KEFLEX) 500 MG capsule Take 1 capsule (500 mg total) by mouth 2 (two) times daily. 07/24/21  Yes 09/23/21, MD  ?lidocaine (LIDODERM) 5 % Place 1 patch onto the skin daily. Remove & Discard patch within 12 hours or as directed by MD 07/24/21  Yes 09/23/21, MD  ?Acetaminophen (TYLENOL PO) Take 2 tablets by mouth daily as needed (pain).    [provider]  ?amLODipine (NORVASC) 5 MG tablet TAKE 1  TABLET BY MOUTH EVERY DAY ?Patient taking differently: Take 5 mg by mouth daily. 02/22/20   Tower, 13/10/21, MD  ?ketoconazole (NIZORAL) 2 % shampoo Apply 1 application topically 2 (two) times a week. Shampoo scalp twice weekly and leave on several minutes before rinsing ?Patient taking differently: Apply 1 application. topically 2 (two) times a week. 02/24/19   Tower, 13/12/20, MD  ?metoprolol succinate (TOPROL-XL) 50 MG 24 hr tablet Take 50 mg by mouth daily. ?Patient not taking: Reported on 07/11/2021 06/30/21   [provider]  ?pantoprazole (PROTONIX) 40 MG tablet Take 1 tablet (40 mg total) by mouth 2 (two) times daily. 07/14/21 08/13/21  10/13/21, MD  ?sucralfate (CARAFATE) 1 GM/10ML suspension Take 10 mLs (1 g total) by mouth 4 (four) times daily -  with meals and at bedtime. 07/14/21 08/13/21  10/13/21, MD  ?   ? ?Allergies    ?Hydrochlorothiazide   ? ?Review of Systems   ?Review of Systems  ?All other systems reviewed and are negative. ? ?Physical Exam ?Updated Vital Signs ?BP (!) 176/89   Pulse 97   Temp 97.9 ?F (36.6 ?C)   Resp (!) 31   Ht 5\' 1"  (1.549 m)   Wt 82 kg   LMP 04/14/1977   SpO2 98%   BMI 34.16 kg/m?  ?Physical Exam ?Vitals and nursing note reviewed.  ?Constitutional:   ?   Appearance: She is  well-developed.  ?HENT:  ?   Head: Normocephalic and atraumatic.  ?Cardiovascular:  ?   Rate and Rhythm: Regular rhythm. Tachycardia present.  ?   Heart sounds: No murmur heard. ?Pulmonary:  ?   Effort: Pulmonary effort is normal. No respiratory distress.  ?   Breath sounds: Normal breath sounds.  ?Abdominal:  ?   Palpations: Abdomen is soft.  ?   Tenderness: There is no guarding or rebound.  ?   Comments: Moderate generalized abdominal tenderness  ?Musculoskeletal:     ?   General: No tenderness.  ?   Comments: 1+ pedal pulses bilaterally.  No lower extremity edema.  There is tenderness to palpation over the right posterior thigh.  She has mild pain with range of motion in the thigh,  knee.  ?Skin: ?   General: Skin is warm and dry.  ?Neurological:  ?   Mental Status: She is alert and oriented to person, place, and time.  ?   Comments: 3-4 out of 5 strength in bilateral lower extremities, right slightly weaker than the left.  4-5 strength in bilateral upper extremities  ?Psychiatric:     ?   Behavior: Behavior normal.  ? ? ?ED Results / Procedures / Treatments   ?Labs ?(all labs ordered are listed, but only abnormal results are displayed) ?Labs Reviewed  ?COMPREHENSIVE METABOLIC PANEL - Abnormal; Notable for the following components:  ?    Result Value  ? Glucose, Bld 104 (*)   ? BUN 26 (*)   ? Creatinine, Ser 1.28 (*)   ? Calcium 8.6 (*)   ? Total Protein 5.7 (*)   ? Albumin 3.1 (*)   ? GFR, Estimated 39 (*)   ? All other components within normal limits  ?CBC WITH DIFFERENTIAL/PLATELET - Abnormal; Notable for the following components:  ? WBC 11.2 (*)   ? RBC 3.27 (*)   ? Hemoglobin 9.9 (*)   ? HCT 30.9 (*)   ? Platelets 639 (*)   ? Neutro Abs 7.8 (*)   ? All other components within normal limits  ?URINALYSIS, ROUTINE W REFLEX MICROSCOPIC - Abnormal; Notable for the following components:  ? APPearance HAZY (*)   ? Specific Gravity, Urine 1.034 (*)   ? Ketones, ur 5 (*)   ? Leukocytes,Ua SMALL (*)   ? Bacteria, UA RARE (*)   ? All other components within normal limits  ?URINE CULTURE  ?LIPASE, BLOOD  ?LACTIC ACID, PLASMA  ?LACTIC ACID, PLASMA  ? ? ?EKG ?EKG Interpretation ? ?Date/Time:  Wednesday July 24 2021 01:24:12 EDT ?Ventricular Rate:  85 ?PR Interval:  164 ?QRS Duration: 168 ?QT Interval:  351 ?QTC Calculation: 418 ?R Axis:   4 ?Text Interpretation: Sinus rhythm Probable left atrial enlargement Left ventricular hypertrophy Probable anterior infarct, age indeterminate Poor data quality in current ECG precludes serial comparison Confirmed by Tilden Fossaees, Tabita Corbo [phone]([id number]) on 07/24/2021 1:47:47 AM ? ?Radiology ?CT Abdomen Pelvis W Contrast ? ?Result Date: 07/24/2021 ?CLINICAL DATA:  Right lower  quadrant abdominal pain and right hip pain. EXAM: CT ABDOMEN AND PELVIS WITH CONTRAST TECHNIQUE: Multidetector CT imaging of the abdomen and pelvis was performed using the standard protocol following bolus administration of intravenous contrast. RADIATION DOSE REDUCTION: This exam was performed according to the departmental dose-optimization program which includes automated exposure control, adjustment of the mA and/or kV according to patient size and/or use of iterative reconstruction technique. CONTRAST:  60mL OMNIPAQUE IOHEXOL 300 MG/ML  SOLN COMPARISON:  07/11/2021. FINDINGS: Lower chest: [place]  atelectasis is noted in the lower lobes bilaterally. There is a 5 mm nodule in the right middle lobe, axial image 5, decreased in size from the prior exam. Hepatobiliary: Scattered hypodensities are noted in the left lobe of the liver, likely cysts. No biliary ductal dilatation. The gallbladder is without stones. Pancreas: Unremarkable. No pancreatic ductal dilatation or surrounding inflammatory changes. Spleen: Normal in size without focal abnormality. Adrenals/Urinary Tract: The adrenal glands are not well seen on exam. Excreted contrast is noted in the right kidney, limiting evaluation for renal calculi. There is severe hydronephrosis involving the left kidney with renal cortical thinning, unchanged from the prior exam. Excreted contrast is present in the urinary bladder. Stomach/Bowel: Stomach is within normal limits. The appendix is not visualized on exam. No evidence of bowel wall thickening, distention, or inflammatory changes. Vascular/Lymphatic: Aortic atherosclerosis. No enlarged abdominal or pelvic lymph nodes. Reproductive: Status post hysterectomy. No adnexal masses. Other: Mild hazy attenuation of the mesentery, possible edema. No free fluid. Musculoskeletal: Degenerative changes in the thoracolumbar spine. There are stable compression deformities at T12 and L4. No acute fracture is identified. IMPRESSION:  1. No acute intra-abdominal process. 2. The appendix is not visualized on exam. 3. Severe hydronephrosis with renal cortical thinning on the left, unchanged from the prior exam. 4. Aortic atherosclerosis. Electronically

## 2021-07-25 ENCOUNTER — Other Ambulatory Visit: Payer: Self-pay

## 2021-07-25 ENCOUNTER — Encounter (HOSPITAL_COMMUNITY): Payer: Self-pay | Admitting: Radiology

## 2021-07-25 ENCOUNTER — Emergency Department (HOSPITAL_COMMUNITY): Payer: Medicare PPO

## 2021-07-25 ENCOUNTER — Inpatient Hospital Stay (HOSPITAL_COMMUNITY)
Admission: EM | Admit: 2021-07-25 | Discharge: 2021-08-12 | DRG: 951 | Disposition: E | Payer: Medicare PPO | Attending: Internal Medicine | Admitting: Internal Medicine

## 2021-07-25 DIAGNOSIS — I129 Hypertensive chronic kidney disease with stage 1 through stage 4 chronic kidney disease, or unspecified chronic kidney disease: Secondary | ICD-10-CM | POA: Diagnosis present

## 2021-07-25 DIAGNOSIS — M810 Age-related osteoporosis without current pathological fracture: Secondary | ICD-10-CM | POA: Diagnosis present

## 2021-07-25 DIAGNOSIS — Z961 Presence of intraocular lens: Secondary | ICD-10-CM | POA: Diagnosis present

## 2021-07-25 DIAGNOSIS — K631 Perforation of intestine (nontraumatic): Secondary | ICD-10-CM | POA: Diagnosis present

## 2021-07-25 DIAGNOSIS — F03A0 Unspecified dementia, mild, without behavioral disturbance, psychotic disturbance, mood disturbance, and anxiety: Secondary | ICD-10-CM | POA: Diagnosis present

## 2021-07-25 DIAGNOSIS — R188 Other ascites: Secondary | ICD-10-CM | POA: Diagnosis present

## 2021-07-25 DIAGNOSIS — N1832 Chronic kidney disease, stage 3b: Secondary | ICD-10-CM | POA: Diagnosis present

## 2021-07-25 DIAGNOSIS — Z9841 Cataract extraction status, right eye: Secondary | ICD-10-CM

## 2021-07-25 DIAGNOSIS — K265 Chronic or unspecified duodenal ulcer with perforation: Secondary | ICD-10-CM | POA: Diagnosis present

## 2021-07-25 DIAGNOSIS — A09 Infectious gastroenteritis and colitis, unspecified: Secondary | ICD-10-CM | POA: Diagnosis present

## 2021-07-25 DIAGNOSIS — G8929 Other chronic pain: Secondary | ICD-10-CM | POA: Diagnosis present

## 2021-07-25 DIAGNOSIS — N3090 Cystitis, unspecified without hematuria: Secondary | ICD-10-CM | POA: Diagnosis present

## 2021-07-25 DIAGNOSIS — Z66 Do not resuscitate: Secondary | ICD-10-CM | POA: Diagnosis present

## 2021-07-25 DIAGNOSIS — Z888 Allergy status to other drugs, medicaments and biological substances status: Secondary | ICD-10-CM

## 2021-07-25 DIAGNOSIS — N1831 Chronic kidney disease, stage 3a: Secondary | ICD-10-CM | POA: Diagnosis present

## 2021-07-25 DIAGNOSIS — Z8249 Family history of ischemic heart disease and other diseases of the circulatory system: Secondary | ICD-10-CM

## 2021-07-25 DIAGNOSIS — N179 Acute kidney failure, unspecified: Secondary | ICD-10-CM | POA: Diagnosis present

## 2021-07-25 DIAGNOSIS — Z79899 Other long term (current) drug therapy: Secondary | ICD-10-CM | POA: Diagnosis not present

## 2021-07-25 DIAGNOSIS — Z9842 Cataract extraction status, left eye: Secondary | ICD-10-CM

## 2021-07-25 DIAGNOSIS — I959 Hypotension, unspecified: Secondary | ICD-10-CM | POA: Diagnosis present

## 2021-07-25 DIAGNOSIS — K659 Peritonitis, unspecified: Secondary | ICD-10-CM | POA: Diagnosis present

## 2021-07-25 DIAGNOSIS — Z87820 Personal history of traumatic brain injury: Secondary | ICD-10-CM | POA: Diagnosis not present

## 2021-07-25 DIAGNOSIS — Z833 Family history of diabetes mellitus: Secondary | ICD-10-CM

## 2021-07-25 DIAGNOSIS — K668 Other specified disorders of peritoneum: Secondary | ICD-10-CM | POA: Diagnosis not present

## 2021-07-25 DIAGNOSIS — M25551 Pain in right hip: Secondary | ICD-10-CM | POA: Diagnosis present

## 2021-07-25 DIAGNOSIS — R627 Adult failure to thrive: Secondary | ICD-10-CM | POA: Diagnosis present

## 2021-07-25 DIAGNOSIS — A419 Sepsis, unspecified organism: Secondary | ICD-10-CM | POA: Diagnosis present

## 2021-07-25 DIAGNOSIS — Z515 Encounter for palliative care: Principal | ICD-10-CM

## 2021-07-25 LAB — TROPONIN I (HIGH SENSITIVITY)
Troponin I (High Sensitivity): 17 ng/L (ref <18)
Troponin I (High Sensitivity): 20 ng/L — ABNORMAL HIGH (ref <18)

## 2021-07-25 LAB — CBC WITH DIFFERENTIAL/PLATELET
Abs Immature Granulocytes: 0.04 10*3/uL (ref 0.00–0.07)
Basophils Absolute: 0 10*3/uL (ref 0.0–0.1)
Basophils Relative: 0 %
Eosinophils Absolute: 0 10*3/uL (ref 0.0–0.5)
Eosinophils Relative: 0 %
HCT: 36.5 % (ref 36.0–46.0)
Hemoglobin: 11 g/dL — ABNORMAL LOW (ref 12.0–15.0)
Immature Granulocytes: 1 %
Lymphocytes Relative: 15 %
Lymphs Abs: 0.9 10*3/uL (ref 0.7–4.0)
MCH: 30 pg (ref 26.0–34.0)
MCHC: 30.1 g/dL (ref 30.0–36.0)
MCV: 99.5 fL (ref 80.0–100.0)
Monocytes Absolute: 0.3 10*3/uL (ref 0.1–1.0)
Monocytes Relative: 5 %
Neutro Abs: 4.8 10*3/uL (ref 1.7–7.7)
Neutrophils Relative %: 79 %
Platelets: 748 10*3/uL — ABNORMAL HIGH (ref 150–400)
RBC: 3.67 MIL/uL — ABNORMAL LOW (ref 3.87–5.11)
RDW: 15 % (ref 11.5–15.5)
WBC: 6.1 10*3/uL (ref 4.0–10.5)
nRBC: 0 % (ref 0.0–0.2)

## 2021-07-25 LAB — COMPREHENSIVE METABOLIC PANEL
ALT: 13 U/L (ref 0–44)
AST: 29 U/L (ref 15–41)
Albumin: 2.8 g/dL — ABNORMAL LOW (ref 3.5–5.0)
Alkaline Phosphatase: 73 U/L (ref 38–126)
Anion gap: 15 (ref 5–15)
BUN: 25 mg/dL — ABNORMAL HIGH (ref 8–23)
CO2: 16 mmol/L — ABNORMAL LOW (ref 22–32)
Calcium: 8.2 mg/dL — ABNORMAL LOW (ref 8.9–10.3)
Chloride: 114 mmol/L — ABNORMAL HIGH (ref 98–111)
Creatinine, Ser: 1.94 mg/dL — ABNORMAL HIGH (ref 0.44–1.00)
GFR, Estimated: 24 mL/min — ABNORMAL LOW (ref >60)
Glucose, Bld: 160 mg/dL — ABNORMAL HIGH (ref 70–99)
Potassium: 3.3 mmol/L — ABNORMAL LOW (ref 3.5–5.1)
Sodium: 145 mmol/L (ref 135–145)
Total Bilirubin: 0.8 mg/dL (ref 0.3–1.2)
Total Protein: 5.6 g/dL — ABNORMAL LOW (ref 6.5–8.1)

## 2021-07-25 LAB — URINE CULTURE

## 2021-07-25 LAB — D-DIMER, QUANTITATIVE: D-Dimer, Quant: 7.49 ug/mL-FEU — ABNORMAL HIGH (ref 0.00–0.50)

## 2021-07-25 LAB — TYPE AND SCREEN
ABO/RH(D): O NEG
Antibody Screen: NEGATIVE

## 2021-07-25 LAB — LIPASE, BLOOD: Lipase: 73 U/L — ABNORMAL HIGH (ref 11–51)

## 2021-07-25 LAB — LACTIC ACID, PLASMA: Lactic Acid, Venous: 5.4 mmol/L (ref 0.5–1.9)

## 2021-07-25 MED ORDER — HYDROMORPHONE HCL 1 MG/ML IJ SOLN
0.5000 mg | INTRAMUSCULAR | Status: DC | PRN
  Administered 2021-07-25 (×2): 0.5 mg via INTRAVENOUS
  Filled 2021-07-25 (×2): qty 1

## 2021-07-25 MED ORDER — HALOPERIDOL 0.5 MG PO TABS
0.5000 mg | ORAL_TABLET | ORAL | Status: DC | PRN
  Filled 2021-07-25: qty 1

## 2021-07-25 MED ORDER — ONDANSETRON 4 MG PO TBDP: 4.0000 mg | ORAL_TABLET | Freq: Four times a day (QID) | ORAL | Status: DC | PRN

## 2021-07-25 MED ORDER — LORAZEPAM 2 MG/ML IJ SOLN
1.0000 mg | INTRAMUSCULAR | Status: DC | PRN
  Administered 2021-07-26: 1 mg via INTRAVENOUS
  Filled 2021-07-25: qty 1

## 2021-07-25 MED ORDER — BIOTENE DRY MOUTH MT LIQD: 15.0000 mL | OROMUCOSAL | Status: DC | PRN

## 2021-07-25 MED ORDER — LORAZEPAM 1 MG PO TABS: 1.0000 mg | ORAL_TABLET | ORAL | Status: DC | PRN

## 2021-07-25 MED ORDER — HALOPERIDOL LACTATE 2 MG/ML PO CONC
0.5000 mg | ORAL | Status: DC | PRN
  Filled 2021-07-25: qty 0.3

## 2021-07-25 MED ORDER — PIPERACILLIN-TAZOBACTAM 3.375 G IVPB 30 MIN: 3.3750 g | Freq: Once | INTRAVENOUS | Status: DC

## 2021-07-25 MED ORDER — DIPHENHYDRAMINE HCL 50 MG/ML IJ SOLN: 12.5000 mg | INTRAMUSCULAR | Status: DC | PRN

## 2021-07-25 MED ORDER — POLYVINYL ALCOHOL 1.4 % OP SOLN
1.0000 [drp] | Freq: Four times a day (QID) | OPHTHALMIC | Status: DC | PRN
  Filled 2021-07-25: qty 15

## 2021-07-25 MED ORDER — IOHEXOL 350 MG/ML SOLN
80.0000 mL | Freq: Once | INTRAVENOUS | Status: AC | PRN
  Administered 2021-07-25: 80 mL via INTRAVENOUS

## 2021-07-25 MED ORDER — PIPERACILLIN-TAZOBACTAM 3.375 G IVPB: 3.3750 g | Freq: Two times a day (BID) | INTRAVENOUS | Status: DC

## 2021-07-25 MED ORDER — FENTANYL CITRATE PF 50 MCG/ML IJ SOSY
25.0000 ug | PREFILLED_SYRINGE | Freq: Once | INTRAMUSCULAR | Status: AC
Start: 2021-07-25 — End: 2021-07-25
  Administered 2021-07-25: 25 ug via INTRAVENOUS
  Filled 2021-07-25: qty 1

## 2021-07-25 MED ORDER — GLYCOPYRROLATE 1 MG PO TABS
1.0000 mg | ORAL_TABLET | ORAL | Status: DC | PRN
  Filled 2021-07-25: qty 1

## 2021-07-25 MED ORDER — GLYCOPYRROLATE 0.2 MG/ML IJ SOLN: 0.2000 mg | INTRAMUSCULAR | Status: DC | PRN

## 2021-07-25 MED ORDER — LORAZEPAM 2 MG/ML PO CONC
1.0000 mg | ORAL | Status: DC | PRN
  Filled 2021-07-25: qty 0.5

## 2021-07-25 MED ORDER — HALOPERIDOL LACTATE 5 MG/ML IJ SOLN
0.5000 mg | INTRAMUSCULAR | Status: DC | PRN
  Administered 2021-07-26: 0.5 mg via INTRAVENOUS
  Filled 2021-07-25: qty 1

## 2021-07-25 MED ORDER — FENTANYL CITRATE PF 50 MCG/ML IJ SOSY
50.0000 ug | PREFILLED_SYRINGE | INTRAMUSCULAR | Status: DC | PRN
  Administered 2021-07-25: 50 ug via INTRAVENOUS
  Filled 2021-07-25: qty 1

## 2021-07-25 MED ORDER — HYDROMORPHONE HCL 1 MG/ML IJ SOLN
0.5000 mg | INTRAMUSCULAR | Status: DC | PRN
Start: 2021-07-25 — End: 2021-07-27
  Administered 2021-07-25 – 2021-07-26 (×7): 0.5 mg via INTRAVENOUS
  Filled 2021-07-25 (×2): qty 0.5
  Filled 2021-07-25 (×5): qty 1

## 2021-07-25 MED ORDER — ONDANSETRON HCL 4 MG/2ML IJ SOLN: 4.0000 mg | Freq: Four times a day (QID) | INTRAMUSCULAR | Status: DC | PRN

## 2021-07-25 MED ORDER — SODIUM CHLORIDE 0.9 % IV BOLUS
1000.0000 mL | Freq: Once | INTRAVENOUS | Status: AC
  Administered 2021-07-25: 1000 mL via INTRAVENOUS

## 2021-07-25 NOTE — Consult Note (Addendum)
? ? ? ? ?Consult Note ? ?Victoria Ball ?Dec 22, 1925  ?AS:[id number].   ? ?Requesting MD: Campbell Stall, DO ?Chief Complaint/Reason for Consult: Pneumoperitoneum ?HPI:  ?Patient is a 88 year old female who was brought in by EMS this AM after being found unresponsive in the bathroom this AM by her daughter. Reportedly on EMS arrival patient was awake, ashen, and had a pulsatile mass in the abdomen. She complained of abdominal pain, nausea and vomiting. She was recently discharged from the hospital 07/14/21 and had been admitted 07/11/21 for UGI bleed. She underwent EGD during that admission and was found to have erosive gastritis, gastric ulcers, and non-bleeding duodenal ulcers. Patient was also seen in the ED yesterday evening with RLQ abdominal pain and hip pain, CT at that time was negative for acute abdominal process and patient was discharged.  ?Patient's daughter notes that she has seen a steady decline in her mother's health over the last 1-2 months. She has been crying every day and is ready to pass away. ? ?PMH otherwise significant for Hx of SDH, Mild dementia , HTN, HLD, CKD stage IIIa, Arthritis , Osteoporosis and Chronic pain. Prior abdominal surgery includes abdominal hysterectomy. Patient is a DNR and per daughter would not want chest compressions or intubation, but would be ok with medical management. Does not appear to be on any blood thinners. Allergy listed to HCTZ.  ? ?ROS: ?Review of Systems  ?Gastrointestinal:  Positive for abdominal pain, nausea and vomiting.  ? ?Family History  ?Problem Relation Age of Onset  ? Coronary artery disease Father   ? Diabetes Father   ?     ?   ? Heart attack Sister 45  ? Coronary artery disease Brother   ?     CABG  ? Coronary artery disease Brother   ?     CABG  ? Coronary artery disease Brother   ?     CABG  ? Angelman syndrome Paternal Grandfather   ? ? ?Past Medical History:  ?Diagnosis Date  ? Arthritis   ? History of central retinal vein occlusion   ? HLD  (hyperlipidemia)   ? Hypertension   ? OP (osteoporosis)   ? stopped Evista after 5 years  ? Subdural hematoma, acute ([place]) 05/15/2015  ? left; "rolled off couch and hit head"  ? ? ?Past Surgical History:  ?Procedure Laterality Date  ? ABDOMINAL HYSTERECTOMY  1994  ? fibroid tumor  ? BIOPSY  07/12/2021  ? Procedure: BIOPSY;  Surgeon: Rush Landmark Telford Nab., MD;  Location: [place];  Service: Gastroenterology;;  ? CATARACT EXTRACTION W/ INTRAOCULAR LENS  IMPLANT, BILATERAL Bilateral   ? ESOPHAGOGASTRODUODENOSCOPY (EGD) WITH PROPOFOL N/A 07/12/2021  ? Procedure: ESOPHAGOGASTRODUODENOSCOPY (EGD) WITH PROPOFOL;  Surgeon: Rush Landmark Telford Nab., MD;  Location: [place];  Service: Gastroenterology;  Laterality: N/A;  ? ? ?Social History:  reports that she is a non-smoker but has been exposed to tobacco smoke. She has never used smokeless tobacco. She reports that she does not drink alcohol and does not use drugs. ? ?Allergies:  ?Allergies  ?Allergen Reactions  ? Hydrochlorothiazide   ?  Low na and high cr on this  ?  ? ? ?(Not in a hospital admission) ? ? ?Blood pressure 117/63, pulse (!) 131, temperature 98 ?F (36.7 ?C), temperature source Oral, resp. rate 18, last menstrual period 04/14/1977, SpO2 93 %. ?Physical Exam:  ?General: frail elderly female who is laying in bed in NAD ?HEENT: head is normocephalic, atraumatic.  Sclera  are noninjected.  Pupils equal and round.  Ears and nose without any masses or lesions.  Mouth is dry ?Heart: tachycardic 130s ?Lungs: respiratory effort nonlabored ?Abd: thin, diffuse tenderness with peritonitis, no masses, hernias, or organomegaly ?MS: all 4 extremities are symmetrical with no cyanosis, clubbing, or edema. ?Skin: warm and dry with no masses, lesions, or rashes ?Psych: A&Ox3 with an appropriate affect but does have some confusion with short and long term memory ? ? ?Results for orders placed or performed during the hospital encounter of 08/02/2021 (from the past 48 hour(s))   ?Type and screen [place]     Status: None (Preliminary result)  ? Collection Time: 07/17/2021 10:46 AM  ?Result Value Ref Range  ? ABO/RH(D) PENDING   ? Antibody Screen PENDING   ? Sample Expiration    ?  07/28/2021,2359 ?Performed at [facility], [place] [address], [place], [place] [address] ?  ?CBC with Differential     Status: Abnormal (Preliminary result)  ? Collection Time: 07/13/2021 11:07 AM  ?Result Value Ref Range  ? WBC 6.1 4.0 - 10.5 K/uL  ? RBC 3.67 (L) 3.87 - 5.11 MIL/uL  ? Hemoglobin 11.0 (L) 12.0 - 15.0 g/dL  ? HCT 36.5 36.0 - 46.0 %  ? MCV 99.5 80.0 - 100.0 fL  ? MCH 30.0 26.0 - 34.0 pg  ? MCHC 30.1 30.0 - 36.0 g/dL  ? RDW 15.0 11.5 - 15.5 %  ? Platelets 748 (H) 150 - 400 K/uL  ? nRBC 0.0 0.0 - 0.2 %  ?  Comment: Performed at [facility], [place] [address], [place], [place] [address]  ? Neutrophils Relative % PENDING %  ? Neutro Abs PENDING 1.7 - 7.7 K/uL  ? Band Neutrophils PENDING %  ? Lymphocytes Relative PENDING %  ? Lymphs Abs PENDING 0.7 - 4.0 K/uL  ? Monocytes Relative PENDING %  ? Monocytes Absolute PENDING 0.1 - 1.0 K/uL  ? Eosinophils Relative PENDING %  ? Eosinophils Absolute PENDING 0.0 - 0.5 K/uL  ? Basophils Relative PENDING %  ? Basophils Absolute PENDING 0.0 - 0.1 K/uL  ? WBC Morphology PENDING   ? RBC Morphology PENDING   ? Smear Review PENDING   ? Other PENDING %  ? nRBC PENDING 0 /100 WBC  ? Metamyelocytes Relative PENDING %  ? Myelocytes PENDING %  ? Promyelocytes Relative PENDING %  ? Blasts PENDING %  ? Immature Granulocytes PENDING %  ? Abs Immature Granulocytes PENDING 0.00 - 0.07 K/uL  ?D-dimer, quantitative     Status: Abnormal  ? Collection Time: 08/11/2021 11:07 AM  ?Result Value Ref Range  ? D-Dimer, Quant 7.49 (H) 0.00 - 0.50 ug/mL-FEU  ?  Comment: (NOTE) ?At the manufacturer cut-off value of 0.5 ?g/mL FEU, this assay has a ?negative predictive value of 95-100%.This assay is intended for use ?in conjunction with a clinical pretest probability  (PTP) assessment ?model to exclude pulmonary embolism (PE) and deep venous thrombosis ?(DVT) in outpatients suspected of PE or DVT. ?Results should be correlated with clinical presentation. ?Performed at [facility], [place] [address] [place], [place] ?[address] ?  ? ?CT Abdomen Pelvis W Contrast ? ?Result Date: 07/24/2021 ?CLINICAL DATA:  Right lower quadrant abdominal pain and right hip pain. EXAM: CT ABDOMEN AND PELVIS WITH CONTRAST TECHNIQUE: Multidetector CT imaging of the abdomen and pelvis was performed using the standard protocol following bolus administration of intravenous contrast. RADIATION DOSE REDUCTION: This exam was performed  according to the departmental dose-optimization program which includes automated exposure control, adjustment of the mA and/or kV according to patient size and/or use of iterative reconstruction technique. CONTRAST:  54mL OMNIPAQUE IOHEXOL 300 MG/ML  SOLN COMPARISON:  07/11/2021. FINDINGS: Lower chest: Strandy atelectasis is noted in the lower lobes bilaterally. There is a 5 mm nodule in the right middle lobe, axial image 5, decreased in size from the prior exam. Hepatobiliary: Scattered hypodensities are noted in the left lobe of the liver, likely cysts. No biliary ductal dilatation. The gallbladder is without stones. Pancreas: Unremarkable. No pancreatic ductal dilatation or surrounding inflammatory changes. Spleen: Normal in size without focal abnormality. Adrenals/Urinary Tract: The adrenal glands are not well seen on exam. Excreted contrast is noted in the right kidney, limiting evaluation for renal calculi. There is severe hydronephrosis involving the left kidney with renal cortical thinning, unchanged from the prior exam. Excreted contrast is present in the urinary bladder. Stomach/Bowel: Stomach is within normal limits. The appendix is not visualized on exam. No evidence of bowel wall thickening, distention, or inflammatory changes. Vascular/Lymphatic: Aortic  atherosclerosis. No enlarged abdominal or pelvic lymph nodes. Reproductive: Status post hysterectomy. No adnexal masses. Other: Mild hazy attenuation of the mesentery, possible edema. No free fluid. Musculoskeletal: Deg

## 2021-07-25 NOTE — ED Triage Notes (Signed)
Patient from home, family called EMS for unresponsiveness this morning. Pt not unresponsive but patient c/o abdominal pain, pale/gray in color, possible pulsating mass in abdomen, and hypotensive and tachycardic.  ?

## 2021-07-25 NOTE — ED Notes (Signed)
Patient transported to CT 

## 2021-07-25 NOTE — Consult Note (Signed)
? ?                                                                                ?Consultation Note ?Date: 07/16/2021  ? ?Patient Name: Victoria Ball  ?DOB: 15-Sep-1925  MRN: [id number]  Age / Sex: 88 y.o., female  ?PCP: Pcp, No ?Referring Physician: Aldine Contes, MD ? ?Reason for Consultation: Hospice Evaluation, Inpatient hospice referral, and Psychosocial/spiritual support ? ?HPI/Patient Profile: 88 y.o. female  with past medical history of SDH, Mild dementia, HTN, HLD, CKD stage IIIa, Arthritis, Osteoporosis and Chronic pain admitted on 07/24/2021 with abdominal pain, nausea, vomiting. ? ?Patient recently discharged 4/2 for UGI bleed, found to have  erosive gastritis, gastric ulcers, and non-bleeding duodenal ulcers. CT with pneumoperitoneum and suspected perforated gastric or duodenal ulcer. Patient and family elected for comfort measures given high surgical risk. PMT has been consulted to assist with discussion of hospice options. ? ?Clinical Assessment and Goals of Care: ? ?I have reviewed medical records including EPIC notes, labs and imaging, received report from RN, assessed the patient, met at the beside, and called patient's daughter to discuss diagnosis prognosis, GOC, EOL wishes, disposition and options. ? ?I introduced Palliative Medicine as specialized medical care for people living with serious illness. It focuses on providing relief from the symptoms and stress of a serious illness. The goal is to improve quality of life for both the patient and the family. ? ?We discussed a brief life review of the patient and then focused on their current illness. The natural disease trajectory and expectations at EOL were discussed. ? ?I attempted to elicit values and goals of care important to the patient.   ? ?Medical History Review and Understanding: ?Patient's daughter has an understanding of the severity of patient's illness with likely death from acute bowel perforation. ? ?Functional and Nutritional  State: ?Patient with a couple months of steady decline and worsening quality of life. ? ?Palliative Symptoms: ?Abdominal pain, nausea, vomiting ? ? ?Code Status: ?Concepts specific to code status, artifical feeding and hydration, and rehospitalization were considered and discussed. ? ?Discussion: ?Patient tells me her pain is 10 at its worst and 5 at its best. During our discussion her pain is at a 6 or 7. She is agreeable with receiving PRN dose of PO dilaudid and for this PA to call her daughter for palliative support needs. I then called patient's daughter and provided updates on medical status including worsening hypotension and anticipated hospital death. She tells me she was initially wondering about hospice options and differences between residential and home hospice. I offered to discuss the referral process, despite that the patient is unfortunately not likely to be stable for transport. Patient's daughter prefers to play it by ear and discuss further if she appears stabilized.   ? ?Questions and concerns were addressed.  The family was encouraged to call with questions or concerns.  PMT will continue to support holistically.  ?  ? ?SUMMARY OF RECOMMENDATIONS   ?-DNR ?-Comfort care per [facility] as ordered by IMTS ?-Psychosocial and emotional support provided ?-Ongoing support from PMT ? ? ?Prognosis:  ?Hours - Days ? ?Discharge Planning: Anticipated Hospital Death  ? ?  ? ?  Primary Diagnoses: ?Present on Admission: ? Perforated bowel ([place]) ? ? ?I have reviewed the medical record, interviewed the patient and family, and examined the patient. The following aspects are pertinent. ? ?Past Medical History:  ?Diagnosis Date  ? Arthritis   ? History of central retinal vein occlusion   ? HLD (hyperlipidemia)   ? Hypertension   ? OP (osteoporosis)   ? stopped Evista after 5 years  ? Subdural hematoma, acute ([place]) 05/15/2015  ? left; "rolled off couch and hit head"  ? ?Social History  ? ?Socioeconomic History  ? Marital  status: Widowed  ?  Spouse name: Not on file  ? Number of children: Not on file  ? Years of education: Not on file  ? Highest education level: Not on file  ?Occupational History  ? Occupation: Retired  ?Tobacco Use  ? Smoking status: Passive Smoke Exposure - Never Smoker  ? Smokeless tobacco: Never  ?Substance and Sexual Activity  ? Alcohol use: No  ?  Alcohol/week: 0.0 standard drinks  ? Drug use: No  ? Sexual activity: Not Currently  ?Other Topics Concern  ? Not on file  ?Social History Narrative  ? Not on file  ? ?Social Determinants of Health  ? ?Financial Resource Strain: Not on file  ?Food Insecurity: Not on file  ?Transportation Needs: Not on file  ?Physical Activity: Not on file  ?Stress: Not on file  ?Social Connections: Not on file  ? ?Family History  ?Problem Relation Age of Onset  ? Coronary artery disease Father   ? Diabetes Father   ?     ?   ? Heart attack Sister 70  ? Coronary artery disease Brother   ?     CABG  ? Coronary artery disease Brother   ?     CABG  ? Coronary artery disease Brother   ?     CABG  ? Angelman syndrome Paternal Grandfather   ? ?Scheduled Meds: ?Continuous Infusions: ?PRN Meds:.antiseptic oral rinse, diphenhydrAMINE, glycopyrrolate **OR** glycopyrrolate **OR** glycopyrrolate, haloperidol **OR** haloperidol **OR** haloperidol lactate, HYDROmorphone (DILAUDID) injection, LORazepam **OR** LORazepam **OR** LORazepam, ondansetron **OR** ondansetron (ZOFRAN) IV, polyvinyl alcohol ?Medications Prior to Admission:  ?Prior to Admission medications   ?Medication Sig Start Date End Date Taking? Authorizing Provider  ?Acetaminophen (TYLENOL PO) Take 2 tablets by mouth daily as needed (pain).   Yes [provider]  ?amLODipine (NORVASC) 5 MG tablet TAKE 1 TABLET BY MOUTH EVERY DAY ?Patient taking differently: Take 5 mg by mouth daily. 02/22/20  Yes Tower, Wynelle Fanny, MD  ?Amoxicill-Rifabutin-Omeprazole (TALICIA) 130-86.5-78 MG CPDR Take 4 capsules by mouth with breakfast, with lunch,  and with evening meal for 14 days. ?Patient not taking: Reported on 07/22/2021 07/17/21 07/31/21  Ladene Artist, MD  ?pantoprazole (PROTONIX) 40 MG tablet Take 1 tablet (40 mg total) by mouth 2 (two) times daily. 07/14/21 08/13/21 Yes Iona Beard, MD  ?sucralfate (CARAFATE) 1 GM/10ML suspension Take 10 mLs (1 g total) by mouth 4 (four) times daily -  with meals and at bedtime. 07/14/21 08/13/21 Yes Iona Beard, MD  ?cephALEXin (KEFLEX) 500 MG capsule Take 1 capsule (500 mg total) by mouth 2 (two) times daily. ?Patient not taking: Reported on 07/17/2021 07/24/21   Quintella Reichert, MD  ?ketoconazole (NIZORAL) 2 % shampoo Apply 1 application topically 2 (two) times a week. Shampoo scalp twice weekly and leave on several minutes before rinsing ?Patient not taking: Reported on 07/30/2021 02/24/19   Abner Greenspan, MD  ?lidocaine (  LIDODERM) 5 % Place 1 patch onto the skin daily. Remove & Discard patch within 12 hours or as directed by MD ?Patient not taking: Reported on 07/24/2021 07/24/21   Quintella Reichert, MD  ?metoprolol succinate (TOPROL-XL) 50 MG 24 hr tablet Take 50 mg by mouth daily. ?Patient not taking: Reported on 07/11/2021 06/30/21   [provider]  ? ?Allergies  ?Allergen Reactions  ? Hydrochlorothiazide   ?  Low na and high cr on this  ?  ? ?Review of Systems  ?Gastrointestinal:  Positive for abdominal pain.  ?All other systems reviewed and are negative. ? ?Physical Exam ?Vitals and nursing note reviewed.  ?Constitutional:   ?   Appearance: She is ill-appearing.  ?   Interventions: Nasal cannula in place.  ?Cardiovascular:  ?   Rate and Rhythm: Tachycardia present.  ?Pulmonary:  ?   Effort: Pulmonary effort is normal.  ?Neurological:  ?   Mental Status: She is alert.  ?Psychiatric:     ?   Behavior: Behavior is cooperative.  ? ? ?Vital Signs: BP (!) 78/53   Pulse (!) 124   Temp 98 ?F (36.7 ?C) (Oral)   Resp 19   LMP 04/14/1977   SpO2 95%  ?Pain Scale: 0-10 ?  ?Pain Score: 0-No pain ? ? ?SpO2: SpO2: 95  % ?O2 Device:SpO2: 95 % ?O2 Flow Rate: .  ? ?IO: Intake/output summary:  ?Intake/Output Summary (Last 24 hours) at 07/31/2021 1517 ?Last data filed at 07/17/2021 1110 ?Gross per 24 hour  ?Intake 999 ml  ?Output

## 2021-07-25 NOTE — Progress Notes (Signed)
Pharmacy Antibiotic Note ? ?Victoria Ball is a 88 y.o. female admitted on 07/27/2021 presenting with abdominal pain, pulsatile mass in abdomen.  Pharmacy has been consulted for zosyn dosing.  ? ?Plan: ?Zosyn 3.375 IV q 12h (extended 4h infusion) ?Monitor renal function, IAI workup and LOT ? ?  ? ?Temp (24hrs), Avg:98 ?F (36.7 ?C), Min:98 ?F (36.7 ?C), Max:98 ?F (36.7 ?C) ? ?Recent Labs  ?Lab 07/24/21 ?0128 07/24/21 ?0405 07/24/2021 ?1107  ?WBC 11.2*  --  6.1  ?CREATININE 1.28*  --  1.94*  ?LATICACIDVEN 0.9 0.7  --   ?  ?Estimated Creatinine Clearance: 17.6 mL/min (A) (by C-G formula based on SCr of 1.94 mg/dL (H)).   ? ?Allergies  ?Allergen Reactions  ? Hydrochlorothiazide   ?  Low na and high cr on this  ?  ? ? ?Bertis Ruddy, PharmD ?Clinical Pharmacist ?ED Pharmacist Phone # [phone] ?08/06/2021 12:49 PM ? ? ?

## 2021-07-25 NOTE — H&P (Signed)
? ?NAME:  Victoria Ball, MRN:  [id number], DOB:  [birth year], LOS: 0 ?ADMISSION DATE:  07/31/2021, Primary: Pcp, No  ?CHIEF COMPLAINT:  abdominal pain  ? ?Medical Service: Internal Medicine Teaching Service    ?     ?Attending Physician: Dr. Earl Lagos, MD    ?First Contact: Dr. Sharene Butters Pager: [phone]  ?Second Contact: Dr. Elaina Pattee Pager: [phone]  ?     ?After Hours (After 5p/  First Contact Pager: [phone]  ?weekends / holidays): Second Contact Pager: [phone]  ? ? ?History of present illness   ?Victoria Ball is a 88 year old female who was recently admitted in 3/30-07/14/21 for GI bleeding which was found to be attributable to erosive gastritis with duoedenal ulcers.  ? ?She presented to the ED yesterday morning with complaints of right hip pain. Due to recent admission findings, abdominal CT was obtained and did not reveal an acute process.  ? ?She was brought to the ED this morning, via EMS, after being found unresponsive by her daughter, with whom she lives with. She complained of abdominal pain upon arrival to the ED. Abdominal CT revealed bowel perforation and IMTS was consulted for admission.  ? ?Past Medical History  ?She,  has a past medical history of Arthritis, History of central retinal vein occlusion, HLD (hyperlipidemia), Hypertension, OP (osteoporosis), and Subdural hematoma, acute ([facility]) (05/15/2015).  ? ?Home Medications    ? ?Prior to Admission medications   ?Medication Sig Start Date End Date Taking? Authorizing Provider  ?Acetaminophen (TYLENOL PO) Take 2 tablets by mouth daily as needed (pain).   Yes [provider]  ?amLODipine (NORVASC) 5 MG tablet TAKE 1 TABLET BY MOUTH EVERY DAY ?Patient taking differently: Take 5 mg by mouth daily. 02/22/20  Yes Tower, Audrie Gallus, MD  ?Amoxicill-Rifabutin-Omeprazole (TALICIA) 250-12.5-10 MG CPDR Take 4 capsules by mouth with breakfast, with lunch, and with evening meal for 14 days. ?Patient not taking: Reported on 07/18/2021 07/17/21 07/31/21  Meryl Dare, MD  ?pantoprazole (PROTONIX) 40 MG tablet Take 1 tablet (40 mg total) by mouth 2 (two) times daily. 07/14/21 08/13/21 Yes Quincy Simmonds, MD  ?sucralfate (CARAFATE) 1 GM/10ML suspension Take 10 mLs (1 g total) by mouth 4 (four) times daily -  with meals and at bedtime. 07/14/21 08/13/21 Yes Quincy Simmonds, MD  ?cephALEXin (KEFLEX) 500 MG capsule Take 1 capsule (500 mg total) by mouth 2 (two) times daily. ?Patient not taking: Reported on 08/10/2021 07/24/21   Tilden Fossa, MD  ?ketoconazole (NIZORAL) 2 % shampoo Apply 1 application topically 2 (two) times a week. Shampoo scalp twice weekly and leave on several minutes before rinsing ?Patient not taking: Reported on 08/10/2021 02/24/19   Tower, Audrie Gallus, MD  ?lidocaine (LIDODERM) 5 % Place 1 patch onto the skin daily. Remove & Discard patch within 12 hours or as directed by MD ?Patient not taking: Reported on 07/23/2021 07/24/21   Tilden Fossa, MD  ?metoprolol succinate (TOPROL-XL) 50 MG 24 hr tablet Take 50 mg by mouth daily. ?Patient not taking: Reported on 07/11/2021 06/30/21   [provider]  ? ? ?Allergies   ? ?Allergies as of 07/24/2021 - Review Complete 07/31/2021  ?Allergen Reaction Noted  ? Hydrochlorothiazide  10/27/2016  ? ? ?Social History  ? reports that she is a non-smoker but has been exposed to tobacco smoke. She has never used smokeless tobacco. She reports that she does not drink alcohol and does not use drugs.  ? ?Family History   ?Her family  history includes Angelman syndrome in her paternal grandfather; Coronary artery disease in her brother, brother, brother, and father; Diabetes in her father; Heart attack (age of onset: 71) in her sister.  ? ? ?Objective   ?Blood pressure (!) 82/50, pulse (!) 128, temperature 98 ?F (36.7 ?C), temperature source Oral, resp. rate (!) 22, last menstrual period 04/14/1977, SpO2 91 %. ?   ?General: acutely ill, frail, and pale appearing female laying in bed in no distress ?Cardiac: tachycardic rate, cool  extremities ?Pulm: lungs clear ?GI: diffuse abdominal tenderness ?Neuro: alert. Responds appropriately to questions.  ?Significant Diagnostic Tests:  ? ? ? ?  Latest Ref Rng & Units Aug 14, 2021  ? 11:07 AM 07/24/2021  ?  1:28 AM 07/14/2021  ?  9:20 AM  ?CBC  ?WBC 4.0 - 10.5 K/uL 6.1   11.2     ?Hemoglobin 12.0 - 15.0 g/dL 73.7   9.9   8.9    ?Hematocrit 36.0 - 46.0 % 36.5   30.9   26.0    ?Platelets 150 - 400 K/uL 748   639     ? ? ?  Latest Ref Rng & Units 08-14-21  ? 11:07 AM 07/24/2021  ?  1:28 AM 07/14/2021  ?  3:18 AM  ?BMP  ?Glucose 70 - 99 mg/dL 106   269   485    ?BUN 8 - 23 mg/dL 25   26   40    ?Creatinine 0.44 - 1.00 mg/dL 4.62   7.03   5.00    ?Sodium 135 - 145 mmol/L 145   142   140    ?Potassium 3.5 - 5.1 mmol/L 3.3   3.5   4.1    ?Chloride 98 - 111 mmol/L 114   111   112    ?CO2 22 - 32 mmol/L 16   25   20     ?Calcium 8.9 - 10.3 mg/dL 8.2   8.6   8.1    ? ? ?CT Angio Chest/Abd/Pel for Dissection W and/or Wo Contrast ?Result Date: 08/14/21 ?IMPRESSION: 1. Normal contour and caliber of the thoracic and abdominal aorta without evidence of aneurysm, dissection, or other acute aortic pathology. Moderate mixed calcific atherosclerosis. 2. New moderate volume ascites and small volume pneumoperitoneum, consistent with viscus organ perforation. 3. Thickened, hyperenhancing loops of proximal to mid small bowel in the left hemiabdomen and left lower quadrant, consistent with nonspecific infectious or inflammatory enteritis, possibly a nidus of perforation, although perforation is not directly visualized. These results were called by telephone at the time of interpretation on 2021-08-14 at 11:14 am to Dr. 07/27/2021 , who verbally acknowledged these results. Electronically Signed   By: Edwin Dada M.D.   On: 08-14-2021 11:18  ? ? ?Summary  ?88 yo chronically ill female recently admitted for GI bleed who presented to the ED after being unresponsive by her daughter. Workup on admission revealed a bowel perforation.  Patient and her family have elected to forego surgical management and wishes to proceed to comfort care.  ? ?Assessment & Plan:  ?Principal Problem: ?  Perforated bowel (HCC) ? ?Sepsis ?Bowel perforation ?Erosive gastritis, Duodenal ulcers ?Comfort care ? ?Discussion: ?Imaging findings, including pneumoperitoneum and free fluid in the abdomen, consistent with bowel perforation. Suspect duodenal ulcer is the source of perforation, based on EGD findings from her recent admission. Surgery was consulted today and family has elected to forego surgical intervention. After this decision, I visited with pt's daughter and her. They understand that,  in the absence of surgery, sepsis will progress resulting in death. Antibiotics, without surgical management, would be futile. They would like to proceed to comfort care.  ?I had initially consulted palliative care to discuss hospice however blood pressure is trending down as sepsis progresses. Suspect in hospital death based on current trajectory.  ? ?Plan ?Admit to palliative unit ?Comfort care ?PRN comfort meds--see orders ? ?Chronic medical issues ?CKD IIIb ?Hx of hypertension, hyperlipidemia ?Osteoporosis ?Best practice:  ?CODE STATUS: DNR/DNI comfort care ?DVT for prophylaxis: n/a ?Social considerations/Family communication: daughter updated at beside ?Dispo: Admit patient to Inpatient with expected length of stay greater than 2 midnights. ? ? ?Elige Radonylee Criselda Starke, MD ?Internal Medicine Resident PGY-3 ?[facility] ?Pager: [phone] ?07/17/2021 1:57 PM  ?  ? ? ? ?

## 2021-07-25 NOTE — ED Provider Notes (Signed)
?[facility] EMERGENCY DEPARTMENT ?Provider Note ? ? ?CSN: [id number] ?Arrival date & time: 2021/07/29  1029 ? ?  ? ?History ? ?Chief Complaint  ?Patient presents with  ? Abdominal Pain  ? ? ?Victoria Ball is a 88 y.o. female. ? ?Patient is a 88 year old female presenting from home after her daughter found her unresponsive.  EMS was called to the house for possible CPR consideration however on arrival patient is awake, ashen, pale/Victoria Ball, with a reported pulsatile mass in the abdomen.  Blood pressure on right arm 120 systolic blood pressure on left arm 80 systolic. ? ?Chart review demonstrates patient was recently discharged from the hospital on 07/14/2021 for upper GI bleed.  Patient complains of mid abdominal pain at this time.  Vomiting but no hematemesis.   ? ?I spoke with patient's daughter, Victoria Ball, who states she is DNR. No chest compression, no intubation, if her state were to worsen in anyway. Medical management only.  ? ?The history is provided by the patient and the EMS personnel. No language interpreter was used.  ?Abdominal Pain ?Associated symptoms: no chest pain, no chills, no cough, no dysuria, no fever, no hematuria, no shortness of breath, no sore throat and no vomiting   ? ?  ? ?Home Medications ?Prior to Admission medications   ?Medication Sig Start Date End Date Taking? Authorizing Provider  ?Amoxicill-Rifabutin-Omeprazole (TALICIA) 250-12.5-10 MG CPDR Take 4 capsules by mouth with breakfast, with lunch, and with evening meal for 14 days. 07/17/21 07/31/21  Meryl Dare, MD  ?Acetaminophen (TYLENOL PO) Take 2 tablets by mouth daily as needed (pain).    [provider]  ?amLODipine (NORVASC) 5 MG tablet TAKE 1 TABLET BY MOUTH EVERY DAY ?Patient taking differently: Take 5 mg by mouth daily. 02/22/20   Tower, Audrie Gallus, MD  ?cephALEXin (KEFLEX) 500 MG capsule Take 1 capsule (500 mg total) by mouth 2 (two) times daily. 07/24/21   Tilden Fossa, MD  ?ketoconazole (NIZORAL) 2 % shampoo  Apply 1 application topically 2 (two) times a week. Shampoo scalp twice weekly and leave on several minutes before rinsing ?Patient taking differently: Apply 1 application. topically 2 (two) times a week. 02/24/19   Tower, Audrie Gallus, MD  ?lidocaine (LIDODERM) 5 % Place 1 patch onto the skin daily. Remove & Discard patch within 12 hours or as directed by MD 07/24/21   Tilden Fossa, MD  ?metoprolol succinate (TOPROL-XL) 50 MG 24 hr tablet Take 50 mg by mouth daily. ?Patient not taking: Reported on 07/11/2021 06/30/21   [provider]  ?pantoprazole (PROTONIX) 40 MG tablet Take 1 tablet (40 mg total) by mouth 2 (two) times daily. 07/14/21 08/13/21  Quincy Simmonds, MD  ?sucralfate (CARAFATE) 1 GM/10ML suspension Take 10 mLs (1 g total) by mouth 4 (four) times daily -  with meals and at bedtime. 07/14/21 08/13/21  Quincy Simmonds, MD  ?   ? ?Allergies    ?Hydrochlorothiazide   ? ?Review of Systems   ?Review of Systems  ?Constitutional:  Negative for chills and fever.  ?HENT:  Negative for ear pain and sore throat.   ?Eyes:  Negative for pain and visual disturbance.  ?Respiratory:  Negative for cough and shortness of breath.   ?Cardiovascular:  Negative for chest pain and palpitations.  ?Gastrointestinal:  Positive for abdominal pain. Negative for vomiting.  ?Genitourinary:  Negative for dysuria and hematuria.  ?Musculoskeletal:  Negative for arthralgias and back pain.  ?Skin:  Negative for color change and rash.  ?Neurological:  Negative for seizures and syncope.  ?All other systems reviewed and are negative. ? ?Physical Exam ?Updated Vital Signs ?BP 117/63   Pulse (!) 131   Temp 98 ?F (36.7 ?C) (Oral)   Resp 18   LMP 04/14/1977   SpO2 93%  ?Physical Exam ? ?ED Results / Procedures / Treatments   ?Labs ?(all labs ordered are listed, but only abnormal results are displayed) ?Labs Reviewed  ?CBC WITH DIFFERENTIAL/PLATELET - Abnormal; Notable for the following components:  ?    Result Value  ? RBC 3.67 (*)   ?  Hemoglobin 11.0 (*)   ? Platelets 748 (*)   ? All other components within normal limits  ?D-DIMER, QUANTITATIVE - Abnormal; Notable for the following components:  ? D-Dimer, Quant 7.49 (*)   ? All other components within normal limits  ?COMPREHENSIVE METABOLIC PANEL  ?LIPASE, BLOOD  ?LACTIC ACID, PLASMA  ?LACTIC ACID, PLASMA  ?TYPE AND SCREEN  ?TROPONIN I (HIGH SENSITIVITY)  ? ? ?EKG ?None ? ?Radiology ?CT Abdomen Pelvis W Contrast ? ?Result Date: 07/24/2021 ?CLINICAL DATA:  Right lower quadrant abdominal pain and right hip pain. EXAM: CT ABDOMEN AND PELVIS WITH CONTRAST TECHNIQUE: Multidetector CT imaging of the abdomen and pelvis was performed using the standard protocol following bolus administration of intravenous contrast. RADIATION DOSE REDUCTION: This exam was performed according to the departmental dose-optimization program which includes automated exposure control, adjustment of the mA and/or kV according to patient size and/or use of iterative reconstruction technique. CONTRAST:  21mL OMNIPAQUE IOHEXOL 300 MG/ML  SOLN COMPARISON:  07/11/2021. FINDINGS: Lower chest: Strandy atelectasis is noted in the lower lobes bilaterally. There is a 5 mm nodule in the right middle lobe, axial image 5, decreased in size from the prior exam. Hepatobiliary: Scattered hypodensities are noted in the left lobe of the liver, likely cysts. No biliary ductal dilatation. The gallbladder is without stones. Pancreas: Unremarkable. No pancreatic ductal dilatation or surrounding inflammatory changes. Spleen: Normal in size without focal abnormality. Adrenals/Urinary Tract: The adrenal glands are not well seen on exam. Excreted contrast is noted in the right kidney, limiting evaluation for renal calculi. There is severe hydronephrosis involving the left kidney with renal cortical thinning, unchanged from the prior exam. Excreted contrast is present in the urinary bladder. Stomach/Bowel: Stomach is within normal limits. The appendix is  not visualized on exam. No evidence of bowel wall thickening, distention, or inflammatory changes. Vascular/Lymphatic: Aortic atherosclerosis. No enlarged abdominal or pelvic lymph nodes. Reproductive: Status post hysterectomy. No adnexal masses. Other: Mild hazy attenuation of the mesentery, possible edema. No free fluid. Musculoskeletal: Degenerative changes in the thoracolumbar spine. There are stable compression deformities at T12 and L4. No acute fracture is identified. IMPRESSION: 1. No acute intra-abdominal process. 2. The appendix is not visualized on exam. 3. Severe hydronephrosis with renal cortical thinning on the left, unchanged from the prior exam. 4. Aortic atherosclerosis. Electronically Signed   By: Thornell Sartorius M.D.   On: 07/24/2021 03:55  ? ?CT Angio Chest/Abd/Pel for Dissection W and/or Wo Contrast ? ?Result Date: 08/07/2021 ?CLINICAL DATA:  Acute aortic syndrome suspected, severe abdominal pain radiating to lower back EXAM: CT ANGIOGRAPHY CHEST, ABDOMEN AND PELVIS TECHNIQUE: Non-contrast CT of the chest was initially obtained. Multidetector CT imaging through the chest, abdomen and pelvis was performed using the standard protocol during bolus administration of intravenous contrast. Multiplanar reconstructed images and MIPs were obtained and reviewed to evaluate the vascular anatomy. RADIATION DOSE REDUCTION: This exam was performed according to the  departmental dose-optimization program which includes automated exposure control, adjustment of the mA and/or kV according to patient size and/or use of iterative reconstruction technique. CONTRAST:  80mL OMNIPAQUE IOHEXOL 350 MG/ML SOLN COMPARISON:  07/24/2021 FINDINGS: CTA CHEST FINDINGS VASCULAR Aorta: Satisfactory opacification of the aorta. Normal contour and caliber of the thoracic aorta. No evidence of aneurysm, dissection, or other acute aortic pathology. Moderate mixed calcific atherosclerosis. Cardiovascular: No evidence of pulmonary embolism  on limited non-tailored examination. Normal heart size. No pericardial effusion. Review of the MIP images confirms the above findings. NON VASCULAR Mediastinum/Nodes: No enlarged mediastinal, hilar, or axillary l

## 2021-07-26 DIAGNOSIS — K668 Other specified disorders of peritoneum: Secondary | ICD-10-CM | POA: Insufficient documentation

## 2021-07-26 DIAGNOSIS — K631 Perforation of intestine (nontraumatic): Secondary | ICD-10-CM | POA: Diagnosis not present

## 2021-07-26 MED ORDER — HYDROMORPHONE HCL 1 MG/ML IJ SOLN: 1.0000 mg | INTRAMUSCULAR | Status: DC

## 2021-07-30 LAB — CULTURE, BLOOD (ROUTINE X 2)
Culture: NO GROWTH
Culture: NO GROWTH

## 2021-08-06 ENCOUNTER — Other Ambulatory Visit: Payer: Self-pay | Admitting: Student

## 2021-08-12 NOTE — Progress Notes (Signed)
Internal Medicine Attending:  ? ?I saw and examined the patient. I reviewed the resident?s H&P note and I agree with the resident?s findings and plan as documented in the resident?s note. ? ?In brief, patient is a 89 year old female with recent admitted to the hospital from March 30 to April 2 for GI bleed and was found to have erosive gastritis as well as duodenal ulcers, central retinal vein occlusion, hyperlipidemia, hypertension, subdural hematoma, osteoporosis who presented to the ED with an unresponsive episode. ? ?History is obtained from chart as patient is currently sleeping.  Per chart, patient presented to the ED on the morning prior to her admission with right hip pain.  At that time a CT abdomen was obtained which did not reveal any acute process.  Yesterday morning patient was noted to be unresponsive by her daughter and was brought to the ED for further evaluation.  In the ED patient complained of abdominal pain and had a CT abdomen done which revealed a bowel perforation, moderate new ascites as well as inflammatory/infectious enteritis.  Surgery was consulted and had an extensive discussion with patient and family and they decided to not pursue surgery given the patient's age and comorbidities.  Palliative care was consulted given patient's poor prognosis in the absence of surgery.  They discussed goals of care with the family and the patient and patient was made DNR and transition to comfort care per their wishes. ? ?Today, patient was resting in bed and was asleep when we evaluated her.  She was noted to be tachypneic but otherwise appeared comfortable.  No lower extremity edema was noted.  Patient's systolic blood pressures greater than 100s today and she was tachycardic in the 120s when vitals were done this morning.  Patient's overall prognosis remains poor and she would likely pass in hours to days given her diagnosis of bowel perforation and the decision not to pursue a surgical option.  We  will continue with comfort care measures for now.  Will discuss with TOC about possible transfer to inpatient hospice.  No further work-up at this time. ?

## 2021-08-12 NOTE — Progress Notes (Signed)
Nutrition Brief Note ? ?Patient identified on the Malnutrition Screening Tool (MST) Report. Reviewed chart and pt under comfort care orders. ? ?No nutrition interventions warranted at this time. If nutrition issues arise, please consult RD.  ? ?Greig Castilla, RD, LDN ?Clinical Dietitian ?RD pager # available in AMION  ?After hours/weekend pager # available in AMION ? ? ?

## 2021-08-12 NOTE — Progress Notes (Signed)
[facility] ([facility]) Hospital Liaison note.   ? ?Received request from TOC manager for family interest in [facility]. Spoke with family to confirm interest and answer questions. [facility] is unable to offer a room today. Hospital Liaison will follow up tomorrow or sooner if a room becomes available and eligibility is confirmed.  ? ?Please do not hesitate to call with questions.   ? ?Thank you,   ?Mary Anne Robertson, RN, CCM      ?[facility] Hospital Liaison   ?[phone] ?

## 2021-08-12 NOTE — Progress Notes (Signed)
? ?                                                                                                                                                     ?                                                   ?Daily Progress Note  ? ?Patient Name: Victoria Ball       Date: 08-03-2021 ?DOB: Nov 13, 1925  Age: 88 y.o. MRN#: [id number] ?Attending Physician: Earl Lagos, MD ?Primary Care Physician: Pcp, No ?Admit Date: 08/10/2021 ? ?Reason for Consultation/Follow-up: Terminal Care ? ?Subjective: ?Medical records reviewed. Patient assessed at the bedside. Discussed with RN. She is tachypneic and much more lethargic today, does not respond to this PA. No family present during my visit. ? ?Length of Stay: 1 ? ?Current Medications: ?Scheduled Meds:  ?  HYDROmorphone (DILAUDID) injection  1 mg Intravenous Q4H  ? ? ?PRN Meds: ?antiseptic oral rinse, diphenhydrAMINE, glycopyrrolate **OR** glycopyrrolate **OR** glycopyrrolate, haloperidol **OR** haloperidol **OR** haloperidol lactate, HYDROmorphone (DILAUDID) injection, LORazepam **OR** LORazepam **OR** LORazepam, ondansetron **OR** ondansetron (ZOFRAN) IV, polyvinyl alcohol ? ?Physical Exam ?Vitals and nursing note reviewed.  ?Constitutional:   ?   Appearance: She is ill-appearing.  ?Cardiovascular:  ?   Rate and Rhythm: Normal rate.  ?Pulmonary:  ?   Effort: Tachypnea present.  ?   Comments: Increased work of breathing ?Psychiatric:     ?   Behavior: Behavior is agitated.  ?         ? ?Vital Signs: BP 100/78 (BP Location: Right Arm)   Pulse 82   Temp (!) 97.5 ?F (36.4 ?C) (Oral)   Resp (!) 25   LMP 04/14/1977   SpO2 91%  ?SpO2: SpO2: 91 % ?O2 Device: O2 Device: Room Air ?O2 Flow Rate:   ? ?Intake/output summary: No intake or output data in the 24 hours ending 03-Aug-2021 1606 ?LBM:   ?Baseline Weight:   ?Most recent weight:   ? ?     ?Palliative Assessment/Data: 10% ? ? ? ? ? ?Patient Active Problem List  ? Diagnosis Date Noted  ? Free intraperitoneal air   ? Perforated bowel  (HCC) 08/08/2021  ? Protein-calorie malnutrition, severe 07/12/2021  ? GI bleed 07/11/2021  ? CKD (chronic kidney disease) stage 3, GFR 30-59 ml/min (HCC) 07/11/2021  ? Routine general medical examination at a health care facility 05/20/2016  ? Seborrheic dermatitis of scalp 08/01/2015  ? History of subdural hematoma 05/15/2015  ? Underweight 03/21/2015  ? Constipation 03/21/2015  ? Elevated serum creatinine 03/21/2015  ? Insomnia 08/08/2014  ? Leukocytosis 03/28/2014  ? Osteoarthritis of both hips 09/26/2013  ?  Right hip pain 09/02/2013  ? Encounter for Medicare annual wellness exam 12/20/2012  ? History of central retinal vein occlusion   ? HYPERTENSION, BENIGN ESSENTIAL 03/27/2009  ? BACK PAIN 08/31/2008  ? Vitamin D deficiency 02/07/2008  ? HYPERCHOLESTEROLEMIA 01/27/2007  ? Retinal vascular occlusion 01/27/2007  ? HIP PAIN, RIGHT, CHRONIC 01/27/2007  ? Osteoporosis 01/27/2007  ? ? ?Palliative Care Assessment & Plan  ? ?Patient Profile: ?88 y.o. female  with past medical history of SDH, Mild dementia, HTN, HLD, CKD stage IIIa, Arthritis, Osteoporosis and Chronic pain admitted on 08/06/2021 with abdominal pain, nausea, vomiting. ?  ?Patient recently discharged 4/2 for UGI bleed, found to have  erosive gastritis, gastric ulcers, and non-bleeding duodenal ulcers. CT with pneumoperitoneum and suspected perforated gastric or duodenal ulcer. Patient and family elected for comfort measures given high surgical risk. PMT has been consulted to assist with discussion of hospice options. ? ?Assessment: ?Bowel perforation  ?End of life care ? ?Recommendations/Plan: ?Continue comfort care per [facility] ?Scheduled 1mg  IV dilaudid Q4H for dyspnea ?PMT will continue to follow  ? ? ?Prognosis: ? Hours - Days ? ?Discharge Planning: ?Anticipated Hospital Death ? ?Care plan was discussed with [facility] hospital liaison  ? ? ?MDM: High ? ? ?Dorthy Cooler, PA-C ?Palliative Medicine Team ?Team phone # [phone] ? ?Thank you for allowing the  Palliative Medicine Team to assist in the care of this patient. Please utilize secure chat with additional questions, if there is no response within 30 minutes please call the above phone number. ? ?Palliative Medicine Team providers are available by phone from 7am to 7pm daily and can be reached through the team cell phone.  ?Should this patient require assistance outside of these hours, please call the patient's attending physician.  ? ? ?

## 2021-08-12 NOTE — Care Management (Signed)
Notified Alvin Critchley from [facility] about residential hospice ?

## 2021-08-12 NOTE — Death Summary Note (Addendum)
?  Name: Victoria Ball ?MRN: [id number] ?DOB: 1925/04/16 88 y.o. ? ?Date of Admission: 07/23/2021 10:29 AM ?Date of Discharge: 07/27/2021 ?Attending Physician: No att. providers found ? ?Discharge Diagnosis: ?Principal Problem: ?  Perforated bowel ([place]) ? ? ?Cause of death: Perforated bowel ?Time of death: 08/23/2021 16:11 PM ? ?Disposition and follow-up:   ?Ms.[name] was discharged from [facility] in expired condition.   ? ?Hospital Course: ?Brannon Gilder is a 88 year old female who was recently admitted to [facility] on 07/11/2021 to 07/14/2021 for GI bleed and found to have 6 erosive gastritis and duodenal ulcers, history of central retinal vein occlusion, hypertension, hyperlipidemia, subdural hematoma, osteoporosis.  She presented to [facility] ED on 07/22/2021 after being found unresponsive by her daughter at home.  In the ED patient was reported severe abdominal pain.  CT abdomen was obtained revealing bowel perforation, new ascites, and inflammatory/infectious enteritis.  Bowel perforation suspected to be related to a perforated duodenal ulcer.  Surgery was consulted and after extensive discussion with family and patient decided given her age comorbidities not to pursue surgical intervention.  Family and patient understood that this would result in worsening sepsis and ultimately death.  Palliative care was consulted given patient's poor prognosis.  After goals of care discussion with family and patient patient was made DNR and transition to comfort care in accordance to their wishes.  Patient placed on comfort care measures.  Given her poor prognosis she remained hospital for anticipated in hospital death.  RN pronounced death at 16:11 PM on 08/23/2021.  Family was notified and funeral arrangements coordinated with RN. ? ?Signed: ?Iona Beard, MD ?07/27/2021, 9:29 AM  ?

## 2021-08-12 NOTE — ED Notes (Signed)
This RN consulted Chaplain regarding pt's status & no visitor at bedside. Chaplain agreeable to visit w pt, at bedside ?

## 2021-08-12 NOTE — Progress Notes (Signed)
While in the ED RN requested patient support. Patient has been moved to comfort care and a friend had just left, no indication family will be coming.  Chaplain went bedside.  Patient able to acknowledge no pain, was not cold and did not need anything.  Patient appeared restful and calm.  Chaplain spoke softly offering words of support and a general prayer.  Chaplain connected with RN again and indicated availability for support as needed. ?Chaplain Agustin Cree, [place]. ? ? ? 18-Aug-2021 0025  ?Clinical Encounter Type  ?Visited With Patient;Health care provider  ?Visit Type Patient actively dying;ED  ?Referral From Nurse  ?Consult/Referral To Chaplain  ? ? ?

## 2021-08-12 DEATH — deceased

## 2021-08-22 ENCOUNTER — Ambulatory Visit: Payer: Medicare PPO | Admitting: Gastroenterology

## 2023-07-13 IMAGING — CT CT ANGIO CHEST-ABD-PELV FOR DISSECTION W/ AND WO/W CM
2 of 7 series · 12 of 46 positions shown, 14 images · IV contrast (APPLIED)
Comparison: 07/24/2021

CLINICAL DATA: Acute aortic syndrome suspected, severe abdominal
pain radiating to lower back

EXAM:
CT ANGIOGRAPHY CHEST, ABDOMEN AND PELVIS
TECHNIQUE: Non-contrast CT of the chest was initially obtained.

[Series 5: arterial · axial · arterial · 0.75mm/px · z∈[+816,+1332]mm · 9 of 308 slices shown, 11 images]
[im 33/308  soft-tissue]
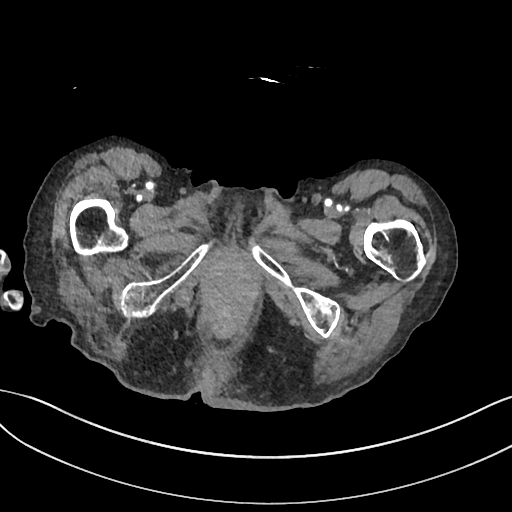
[im 33/308  bone]
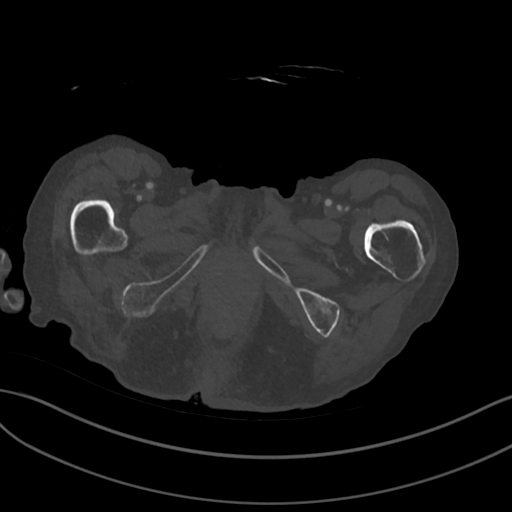
[im 65/308  soft-tissue]
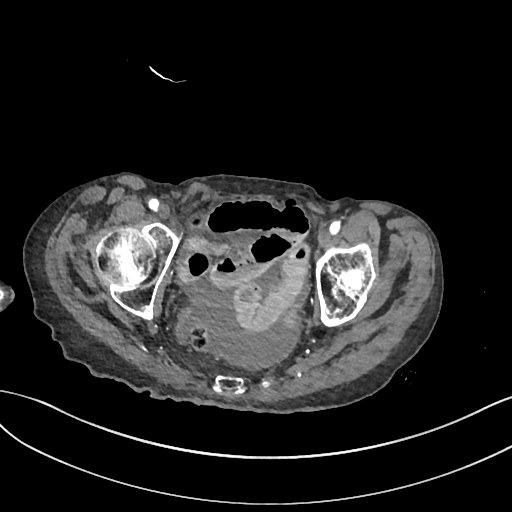
[im 97/308  soft-tissue]
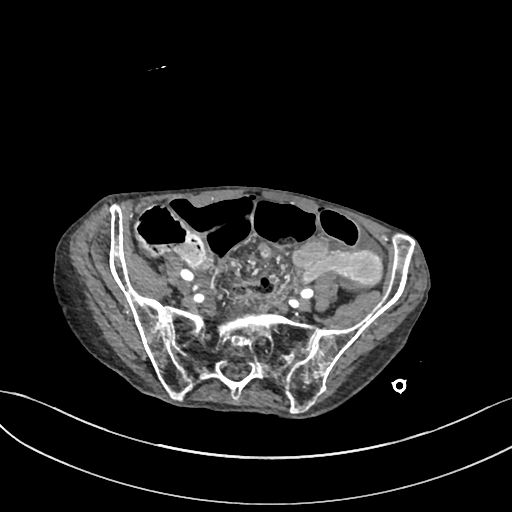
[im 130/308  soft-tissue]
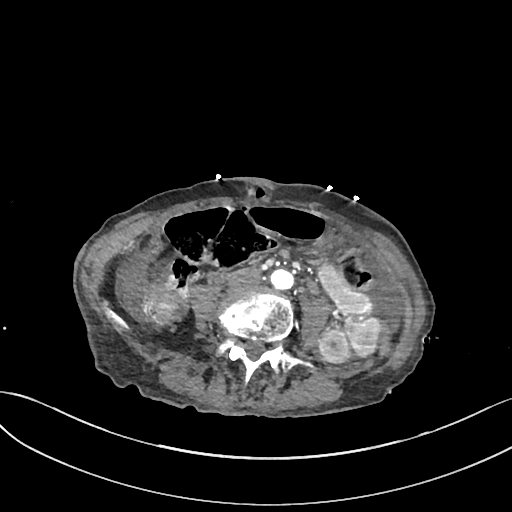
[im 162/308  soft-tissue]
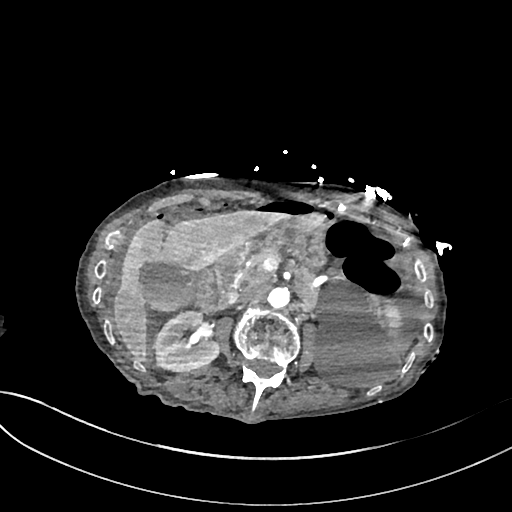
[im 194/308  soft-tissue]
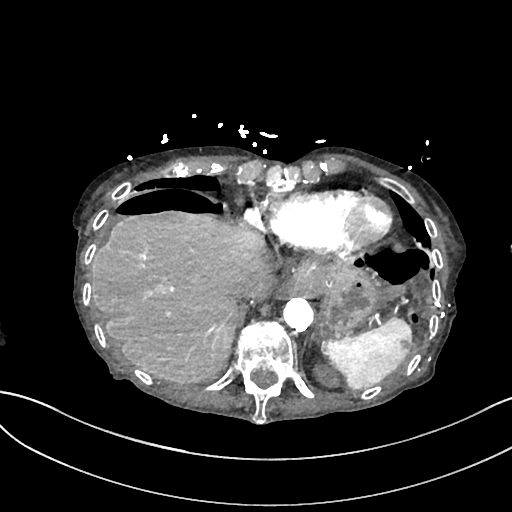
[im 227/308  soft-tissue]
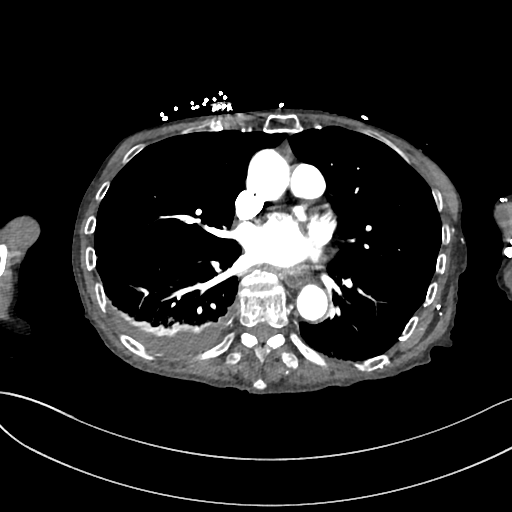
[im 259/308  soft-tissue]
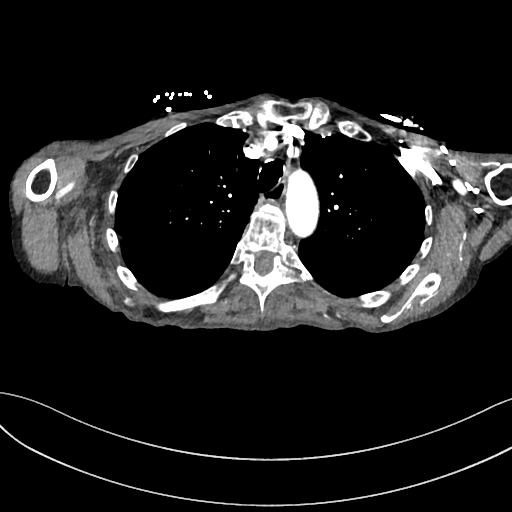
[im 291/308  soft-tissue]
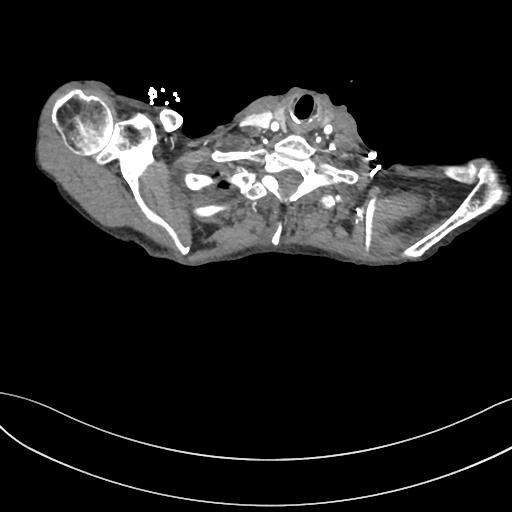
[im 291/308  bone]
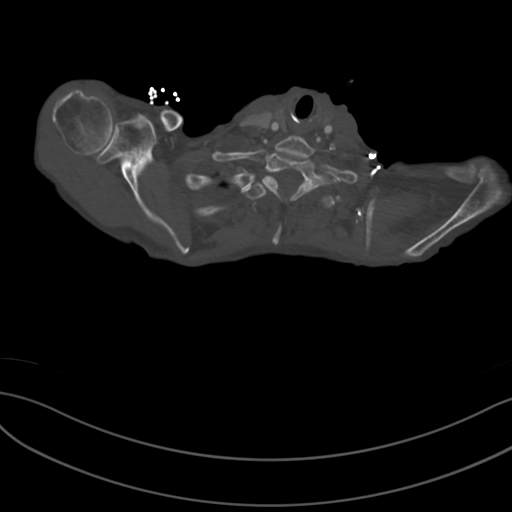

[Series 9: cor · coronal · 0.64mm/px · 3 of 127 slices shown]
[im 32/127  soft-tissue]
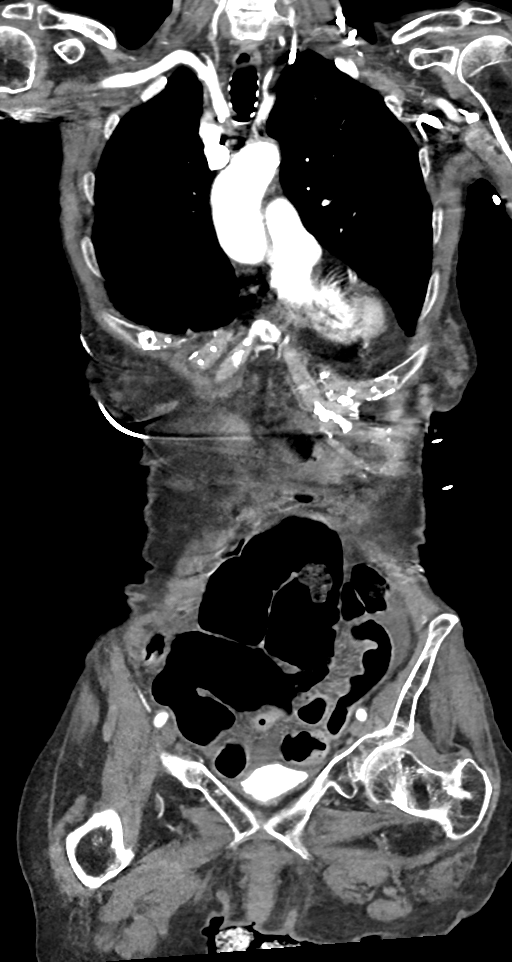
[im 64/127  soft-tissue]
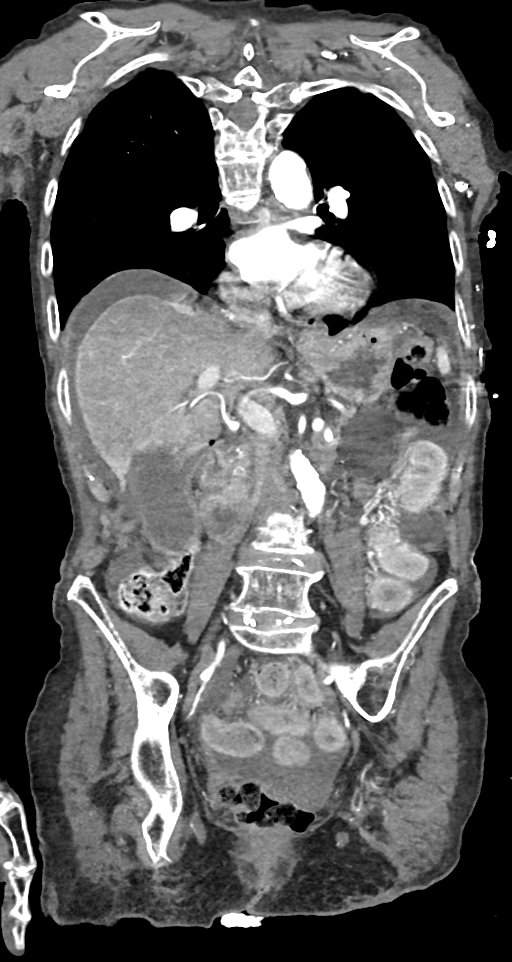
[im 95/127  soft-tissue]
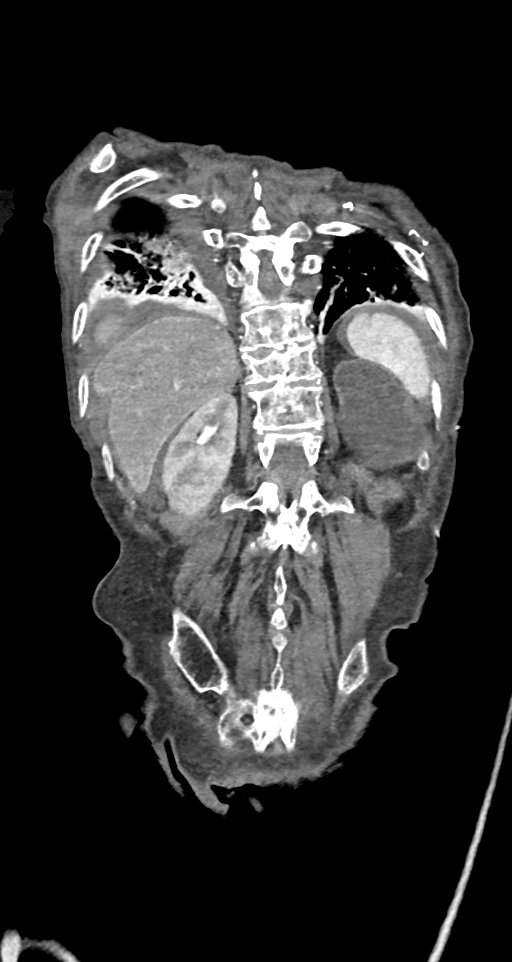

[12 of 46 positions shown; findings below may reference images not displayed]

Multidetector CT imaging through the chest, abdomen and pelvis was
performed using the standard protocol during bolus administration of
intravenous contrast. Multiplanar reconstructed images and MIPs were
obtained and reviewed to evaluate the vascular anatomy.

RADIATION DOSE REDUCTION: This exam was performed according to the
departmental dose-optimization program which includes automated
exposure control, adjustment of the mA and/or kV according to
patient size and/or use of iterative reconstruction technique.

CONTRAST:  80mL OMNIPAQUE IOHEXOL 350 MG/ML SOLN
FINDINGS: CTA CHEST FINDINGS

VASCULAR

Aorta: Satisfactory opacification of the aorta. Normal contour and
caliber of the thoracic aorta. No evidence of aneurysm, dissection,
or other acute aortic pathology. Moderate mixed calcific
atherosclerosis.

Cardiovascular: No evidence of pulmonary embolism on limited
non-tailored examination. Normal heart size. No pericardial
effusion.

Review of the MIP images confirms the above findings.

NON VASCULAR

Mediastinum/Nodes: No enlarged mediastinal, hilar, or axillary lymph
nodes. Small hiatal hernia. Thyroid gland, trachea, and esophagus
demonstrate no significant findings.

Lungs/Pleura: New small right, trace left pleural effusions and
associated atelectasis or consolidation.

Musculoskeletal: No chest wall abnormality. No acute osseous
findings.

Review of the MIP images confirms the above findings.

CTA ABDOMEN AND PELVIS FINDINGS

VASCULAR

Normal contour and caliber of the abdominal aorta. No evidence of
aneurysm, dissection, or other acute aortic pathology. Standard
branching pattern of the abdominal aorta with solitary bilateral
renal arteries. Moderate calcific atherosclerosis.

Review of the MIP images confirms the above findings.

NON-VASCULAR

Hepatobiliary: No solid liver abnormality is seen. No gallstones,
gallbladder wall thickening, or biliary dilatation.

Pancreas: Unremarkable. No pancreatic ductal dilatation or
surrounding inflammatory changes.

Spleen: Normal in size without significant abnormality.

Adrenals/Urinary Tract: Adrenal glands are unremarkable. Unchanged,
severe left hydronephrosis with essentially complete atrophy of the
left renal cortex (series 5, image 139). The right kidney is normal,
without renal calculi, solid lesion, or hydronephrosis. Bladder is
unremarkable.

Stomach/Bowel: Stomach is within normal limits. Appendix not clearly
visualized and may be surgically absent. Thickened, hyperenhancing
loops of proximal to mid small bowel in the left hemiabdomen and
left lower quadrant (series 5, image 173).

Lymphatic: No enlarged abdominal or pelvic lymph nodes.

Reproductive: Status post hysterectomy.

Other: No abdominal wall hernia or abnormality. New moderate volume
ascites. New small volume pneumoperitoneum.

Musculoskeletal: No acute osseous findings. Osteopenia. Unchanged
wedge deformities of T12 and L4 (series 10, image 77).
IMPRESSION: 1. Normal contour and caliber of the thoracic and abdominal aorta
without evidence of aneurysm, dissection, or other acute aortic
pathology. Moderate mixed calcific atherosclerosis.
2. New moderate volume ascites and small volume pneumoperitoneum,
consistent with viscus organ perforation.
3. Thickened, hyperenhancing loops of proximal to mid small bowel in
the left hemiabdomen and left lower quadrant, consistent with
nonspecific infectious or inflammatory enteritis, possibly a nidus
of perforation, although perforation is not directly visualized.

These results were called by telephone at the time of interpretation
on 07/25/2021 at [DATE] to Dr. LAAOUINA TIGER , who verbally
acknowledged these results.
